# Patient Record
Sex: Male | Born: 1965 | Race: White | Hispanic: No | Marital: Married | Smoking: Never smoker
Health system: Southern US, Community
[De-identification: ages and names within clinical notes are randomized; demographics above are authoritative.]

## PROBLEM LIST (undated history)

## (undated) DIAGNOSIS — Z86718 Personal history of other venous thrombosis and embolism: Secondary | ICD-10-CM

## (undated) DIAGNOSIS — S14105A Unspecified injury at C5 level of cervical spinal cord, initial encounter: Secondary | ICD-10-CM

## (undated) DIAGNOSIS — R011 Cardiac murmur, unspecified: Secondary | ICD-10-CM

## (undated) DIAGNOSIS — M2550 Pain in unspecified joint: Secondary | ICD-10-CM

## (undated) DIAGNOSIS — M549 Dorsalgia, unspecified: Secondary | ICD-10-CM

## (undated) DIAGNOSIS — I639 Cerebral infarction, unspecified: Secondary | ICD-10-CM

## (undated) DIAGNOSIS — Z860100 Personal history of colon polyps, unspecified: Secondary | ICD-10-CM

## (undated) DIAGNOSIS — M5410 Radiculopathy, site unspecified: Secondary | ICD-10-CM

## (undated) DIAGNOSIS — Z9889 Other specified postprocedural states: Secondary | ICD-10-CM

## (undated) DIAGNOSIS — F32A Depression, unspecified: Secondary | ICD-10-CM

## (undated) DIAGNOSIS — N3289 Other specified disorders of bladder: Secondary | ICD-10-CM

## (undated) DIAGNOSIS — N529 Male erectile dysfunction, unspecified: Secondary | ICD-10-CM

## (undated) DIAGNOSIS — R269 Unspecified abnormalities of gait and mobility: Secondary | ICD-10-CM

## (undated) DIAGNOSIS — F329 Major depressive disorder, single episode, unspecified: Secondary | ICD-10-CM

## (undated) DIAGNOSIS — Q211 Atrial septal defect: Secondary | ICD-10-CM

## (undated) DIAGNOSIS — K219 Gastro-esophageal reflux disease without esophagitis: Secondary | ICD-10-CM

## (undated) DIAGNOSIS — M109 Gout, unspecified: Secondary | ICD-10-CM

## (undated) DIAGNOSIS — G2581 Restless legs syndrome: Secondary | ICD-10-CM

## (undated) DIAGNOSIS — R945 Abnormal results of liver function studies: Secondary | ICD-10-CM

## (undated) DIAGNOSIS — G959 Disease of spinal cord, unspecified: Secondary | ICD-10-CM

## (undated) DIAGNOSIS — M1990 Unspecified osteoarthritis, unspecified site: Secondary | ICD-10-CM

## (undated) DIAGNOSIS — R112 Nausea with vomiting, unspecified: Secondary | ICD-10-CM

## (undated) DIAGNOSIS — G4733 Obstructive sleep apnea (adult) (pediatric): Secondary | ICD-10-CM

## (undated) DIAGNOSIS — R0981 Nasal congestion: Secondary | ICD-10-CM

## (undated) DIAGNOSIS — I10 Essential (primary) hypertension: Secondary | ICD-10-CM

## (undated) DIAGNOSIS — E162 Hypoglycemia, unspecified: Secondary | ICD-10-CM

## (undated) DIAGNOSIS — G8929 Other chronic pain: Secondary | ICD-10-CM

## (undated) DIAGNOSIS — Z9989 Dependence on other enabling machines and devices: Principal | ICD-10-CM

## (undated) DIAGNOSIS — G4734 Idiopathic sleep related nonobstructive alveolar hypoventilation: Secondary | ICD-10-CM

## (undated) DIAGNOSIS — H5310 Unspecified subjective visual disturbances: Secondary | ICD-10-CM

## (undated) DIAGNOSIS — M2540 Effusion, unspecified joint: Secondary | ICD-10-CM

## (undated) DIAGNOSIS — M79606 Pain in leg, unspecified: Secondary | ICD-10-CM

## (undated) DIAGNOSIS — G8250 Quadriplegia, unspecified: Secondary | ICD-10-CM

## (undated) DIAGNOSIS — I2782 Chronic pulmonary embolism: Secondary | ICD-10-CM

## (undated) DIAGNOSIS — K810 Acute cholecystitis: Secondary | ICD-10-CM

## (undated) DIAGNOSIS — I739 Peripheral vascular disease, unspecified: Secondary | ICD-10-CM

## (undated) DIAGNOSIS — G4700 Insomnia, unspecified: Secondary | ICD-10-CM

## (undated) HISTORY — DX: Chronic pulmonary embolism: I27.82

## (undated) HISTORY — DX: Gout, unspecified: M10.9

## (undated) HISTORY — DX: Essential (primary) hypertension: I10

## (undated) HISTORY — PX: COLONOSCOPY: SHX174

## (undated) HISTORY — PX: TIBIA FRACTURE SURGERY: SHX806

## (undated) HISTORY — DX: Acute cholecystitis: K81.0

## (undated) HISTORY — DX: Nasal congestion: R09.81

## (undated) HISTORY — DX: Unspecified abnormalities of gait and mobility: R26.9

## (undated) HISTORY — DX: Radiculopathy, site unspecified: M54.10

## (undated) HISTORY — DX: Dependence on other enabling machines and devices: Z99.89

## (undated) HISTORY — DX: Abnormal results of liver function studies: R94.5

## (undated) HISTORY — PX: OTHER SURGICAL HISTORY: SHX169

## (undated) HISTORY — DX: Other specified postprocedural states: Z98.890

## (undated) HISTORY — DX: Idiopathic sleep related nonobstructive alveolar hypoventilation: G47.34

## (undated) HISTORY — PX: BACK SURGERY: SHX140

## (undated) HISTORY — DX: Atrial septal defect: Q21.1

## (undated) HISTORY — DX: Unspecified subjective visual disturbances: H53.10

## (undated) HISTORY — PX: FRACTURE SURGERY: SHX138

## (undated) HISTORY — DX: Dorsalgia, unspecified: M54.9

## (undated) HISTORY — DX: Disease of spinal cord, unspecified: G95.9

## (undated) HISTORY — DX: Gastro-esophageal reflux disease without esophagitis: K21.9

## (undated) HISTORY — DX: Obstructive sleep apnea (adult) (pediatric): G47.33

## (undated) HISTORY — DX: Personal history of other venous thrombosis and embolism: Z86.718

## (undated) HISTORY — DX: Pain in leg, unspecified: M79.606

## (undated) HISTORY — DX: Male erectile dysfunction, unspecified: N52.9

---

## 1997-10-23 ENCOUNTER — Ambulatory Visit (HOSPITAL_COMMUNITY): Admission: RE | Admit: 1997-10-23 | Discharge: 1997-10-23 | Payer: Self-pay | Admitting: Gynecology

## 2000-01-13 ENCOUNTER — Encounter: Payer: Self-pay | Admitting: Neurosurgery

## 2000-01-13 ENCOUNTER — Inpatient Hospital Stay (HOSPITAL_COMMUNITY): Admission: EM | Admit: 2000-01-13 | Discharge: 2000-01-17 | Payer: Self-pay

## 2000-01-13 ENCOUNTER — Encounter: Payer: Self-pay | Admitting: General Surgery

## 2000-01-15 ENCOUNTER — Encounter: Payer: Self-pay | Admitting: Neurosurgery

## 2000-01-17 ENCOUNTER — Inpatient Hospital Stay (HOSPITAL_COMMUNITY)
Admission: EM | Admit: 2000-01-17 | Discharge: 2000-02-05 | Payer: Self-pay | Admitting: Physical Medicine and Rehabilitation

## 2000-02-11 ENCOUNTER — Encounter
Admission: RE | Admit: 2000-02-11 | Discharge: 2000-05-11 | Payer: Self-pay | Admitting: Physical Medicine and Rehabilitation

## 2000-03-28 ENCOUNTER — Encounter: Payer: Self-pay | Admitting: Neurosurgery

## 2000-03-28 ENCOUNTER — Ambulatory Visit (HOSPITAL_COMMUNITY): Admission: RE | Admit: 2000-03-28 | Discharge: 2000-03-28 | Payer: Self-pay | Admitting: Neurosurgery

## 2000-04-01 ENCOUNTER — Ambulatory Visit (HOSPITAL_COMMUNITY): Admission: RE | Admit: 2000-04-01 | Discharge: 2000-04-01 | Payer: Self-pay | Admitting: Neurosurgery

## 2000-04-01 ENCOUNTER — Encounter: Payer: Self-pay | Admitting: Neurosurgery

## 2000-05-29 ENCOUNTER — Encounter
Admission: RE | Admit: 2000-05-29 | Discharge: 2000-06-18 | Payer: Self-pay | Admitting: Physical Medicine and Rehabilitation

## 2000-07-22 ENCOUNTER — Ambulatory Visit (HOSPITAL_COMMUNITY): Admission: RE | Admit: 2000-07-22 | Discharge: 2000-07-22 | Payer: Self-pay | Admitting: Orthopedic Surgery

## 2000-07-22 ENCOUNTER — Encounter: Payer: Self-pay | Admitting: Orthopedic Surgery

## 2000-08-04 HISTORY — PX: KNEE ARTHROSCOPY W/ ACL RECONSTRUCTION: SHX1858

## 2000-09-23 ENCOUNTER — Encounter
Admission: RE | Admit: 2000-09-23 | Discharge: 2000-10-09 | Payer: Self-pay | Admitting: Physical Medicine and Rehabilitation

## 2000-11-06 ENCOUNTER — Encounter: Admission: RE | Admit: 2000-11-06 | Discharge: 2001-02-04 | Payer: Self-pay | Admitting: Orthopedic Surgery

## 2001-02-08 ENCOUNTER — Encounter: Admission: RE | Admit: 2001-02-08 | Discharge: 2001-03-04 | Payer: Self-pay | Admitting: Orthopedic Surgery

## 2003-09-19 ENCOUNTER — Encounter
Admission: RE | Admit: 2003-09-19 | Discharge: 2003-12-18 | Payer: Self-pay | Admitting: Physical Medicine & Rehabilitation

## 2010-12-13 ENCOUNTER — Other Ambulatory Visit: Payer: Self-pay | Admitting: Neurology

## 2010-12-13 DIAGNOSIS — M4712 Other spondylosis with myelopathy, cervical region: Secondary | ICD-10-CM

## 2010-12-18 ENCOUNTER — Ambulatory Visit
Admission: RE | Admit: 2010-12-18 | Discharge: 2010-12-18 | Disposition: A | Discharge status: Home / Self Care | Payer: Medicare Other | Source: Ambulatory Visit | Attending: Neurology | Admitting: Neurology

## 2010-12-18 DIAGNOSIS — M4712 Other spondylosis with myelopathy, cervical region: Secondary | ICD-10-CM

## 2011-05-26 ENCOUNTER — Inpatient Hospital Stay (HOSPITAL_COMMUNITY)
Admission: RE | Admit: 2011-05-26 | Discharge: 2011-05-27 | DRG: 053 | Disposition: A | Discharge status: Home / Self Care | Payer: Medicare Other | Source: Ambulatory Visit | Attending: Neurology | Admitting: Neurology

## 2011-05-26 DIAGNOSIS — IMO0002 Reserved for concepts with insufficient information to code with codable children: Secondary | ICD-10-CM

## 2011-05-26 DIAGNOSIS — G4733 Obstructive sleep apnea (adult) (pediatric): Secondary | ICD-10-CM | POA: Diagnosis present

## 2011-05-26 DIAGNOSIS — K219 Gastro-esophageal reflux disease without esophagitis: Secondary | ICD-10-CM | POA: Diagnosis present

## 2011-05-26 DIAGNOSIS — Z79899 Other long term (current) drug therapy: Secondary | ICD-10-CM

## 2011-05-26 DIAGNOSIS — I10 Essential (primary) hypertension: Secondary | ICD-10-CM | POA: Diagnosis present

## 2011-05-26 DIAGNOSIS — F17200 Nicotine dependence, unspecified, uncomplicated: Secondary | ICD-10-CM | POA: Diagnosis present

## 2011-05-26 DIAGNOSIS — G8254 Quadriplegia, C5-C7 incomplete: Principal | ICD-10-CM | POA: Diagnosis present

## 2011-05-26 DIAGNOSIS — R269 Unspecified abnormalities of gait and mobility: Secondary | ICD-10-CM | POA: Diagnosis present

## 2011-05-26 LAB — COMPREHENSIVE METABOLIC PANEL
AST: 24 U/L (ref 0–37)
Alkaline Phosphatase: 72 U/L (ref 39–117)
BUN: 13 mg/dL (ref 6–23)
CO2: 31 mEq/L (ref 19–32)
Chloride: 104 mEq/L (ref 96–112)
Creatinine, Ser: 0.98 mg/dL (ref 0.50–1.35)
GFR calc non Af Amer: >90 mL/min (ref >90)
Potassium: 4.5 mEq/L (ref 3.5–5.1)
Total Bilirubin: 0.2 mg/dL — ABNORMAL LOW (ref 0.3–1.2)

## 2011-05-26 LAB — CBC
HCT: 42 % (ref 39.0–52.0)
Hemoglobin: 13.7 g/dL (ref 13.0–17.0)
MCH: 27.1 pg (ref 26.0–34.0)
MCHC: 32.6 g/dL (ref 30.0–36.0)
MCV: 83 fL (ref 78.0–100.0)
Platelets: 235 10*3/uL (ref 150–400)
RBC: 5.06 MIL/uL (ref 4.22–5.81)
RDW: 15.2 % (ref 11.5–15.5)
WBC: 5.9 10*3/uL (ref 4.0–10.5)

## 2011-05-27 LAB — CSF CELL COUNT WITH DIFFERENTIAL
Tube #: 1
WBC, CSF: 2 /mm3 (ref 0–5)

## 2011-05-27 LAB — PROTEIN AND GLUCOSE, CSF: Glucose, CSF: 63 mg/dL (ref 43–76)

## 2011-06-05 NOTE — Op Note (Signed)
  [name], [name] NO.:  [id number]  MEDICAL RECORD NO.:  [id number]  LOCATION:  3036                         FACILITY:  [facility]  PHYSICIAN:  Marlan Palau, M.D.  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  The patient is being evaluated for possible baclofen pump.  The patient comes in for a baclofen challenge.  Lumbar puncture was performed with the patient in a fetal position on the right side.  Lower back was cleaned with Betadine solution, approximately 2 mL of 1% Xylocaine was used a local anesthetic.  A 20- gauge spinal needle was inserted in the L3-4 interspace and approximately 7 mL of clear colorless spinal fluid removed for testing. Opening pressure was 270 mm of water.  3 mL of baclofen containing 50 mcg of baclofen was inserted into the low back and flushed with 3 mL of sterile preservative-free normal saline.  The patient tolerated the procedure well.  No complications to the above procedure were noted. Spinal fluid was sent for cells, differential, glucose, and protein.  The patient will be seen and evaluated by Physical Therapy per protocol with Ashworth scale.     Marlan Palau, M.D.     CKW/MEDQ  D:  05/27/2011  T:  05/27/2011  Job:  [id number]  cc:   [facility]  Electronically Signed by Thana Farr M.D. on 06/05/2011 09:29:44 AM

## 2011-06-05 NOTE — Discharge Summary (Signed)
NAMEMARLIN, [name] NO.:  [id number]  MEDICAL RECORD NO.:  [id number]  LOCATION:  3036                         FACILITY:  [facility]  PHYSICIAN:  Marlan Palau, M.D.  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  The patient has had significant spasticity involving the lower extremities and the left arm.  The patient has been on oral baclofen and diazepam with ongoing difficulty.  The patient was brought in for a baclofen challenge intrathecally in preparation for possible baclofen pump.  ADMISSION DIAGNOSES: 1. Spinal cord injury with spastic quadriparesis. 2. Gait disorder. 3. Hypertension.  DISCHARGE DIAGNOSES: 1. Spinal cord injury with spastic quadriparesis. 2. Gait disorder. 3. Hypertension.  PROCEDURES DURING THIS ADMISSION:  Lumbar puncture with intrathecal baclofen administration.  Complications of above procedures were none.  HISTORY OF PRESENT ILLNESS:  As above.  PAST MEDICAL HISTORY:  Significant for: 1. Spinal cord injury with spastic quadriparesis. 2. Gait disorder. 3. Obstructive sleep apnea, on CPAP. 4. Right knee surgery. 5. Gastroesophageal reflux disease. 6. Hypertension.  MEDICATIONS PRIOR TO ADMISSION: 1. Diclofenac 75 mg twice daily. 2. Omeprazole 40 mg daily. 3. Gabapentin 800 mg 3 times daily. 4. Baclofen 20 mg 3 times daily. 5. Amitriptyline 150 mg at night. 6. Benazepril/hydrochlorothiazide 20/25 mg tablet 1 tablet daily. 7. Diazepam 5 mg 3 times daily.  The patient chews tobacco, has never smoked.  The patient does not drink alcohol and has no known allergies.  Please refer to history and physical  dictation summary for social history, family history, review of systems, and physical examination.  LABORATORY VALUES:  Notable for white count of 5.9, hemoglobin 13.7, hematocrit 42.0, MCV of 83.0, and platelets of 235.  INR is 1.04. Sodium 143, potassium 4.5, chloride 104, CO2 of 31, glucose of 105, BUN of 13, and creatinine 0.98.  Total bili is 0.2, alk phosphatase 72, SGOT of 24, SGPT of 30, total protein 6.6, albumin 3.6, and calcium 9.2.  Spinal fluid analysis revealed 2 white cells, 0 red cells, glucose 63, total protein 64.  HOSPITAL COURSE:  The patient was admitted for an evaluation.  Physical therapy consult was obtained and the patient had an evaluation with the intrathecal baclofen protocol with an Ashworth scale the evening prior to the spinal tap.  The patient underwent lumbar puncture the morning of May 27, 2011 and 50 mcg of intrathecal baclofen was administered. The patient had physical therapy evaluations 1, 2, and 4 hours after the intrathecal baclofen.  The patient did respond to the intrathecal baclofen.  The patient had been tapered off oral Bactrim prior to the trial.  The patient was discharged to home on his home medications.  The patient will be referred to Neurosurgery for possible baclofen pump placement.  The patient did well following the spinal tap.     Marlan Palau, M.D.  CKW/MEDQ  D:  05/27/2011  T:  05/28/2011  Job:  [id number]  cc:   [facility]  Electronically Signed by Thana Farr M.D. on 06/05/2011 04:54:09 AM

## 2011-06-05 NOTE — H&P (Signed)
NAMEXAVI, TOMASIK NO.:  [id number]  MEDICAL RECORD NO.:  [id number]  LOCATION:  3036                         FACILITY:  [facility]  PHYSICIAN:  Marlan Palau, M.D.  DATE OF BIRTH:  [birth year]  DATE OF ADMISSION:  05/26/2011 DATE OF DISCHARGE:                             HISTORY & PHYSICAL   HISTORY PRESENT ILLNESS:  Ralph [name] is a 45 year old right- handed white male, born on 03/20/1966, with a history of a spinal cord injury at the C5-6 level with a spastic quadriparesis.  The patient generally ambulates with a cane and does fall on occasion.  The patient has been on oral baclofen and diazepam with some trouble with drowsiness and cognitive issues.  The patient does report chronic back pain and pain into the hips.  The patient has some increasing problems with walking, proximal weakness of the right leg, and spasticity in the legs. The patient desires to have a baclofen pump insertion to help this spasticity.  The patient came to the hospital for a baclofen intrathecal challenge.  PAST MEDICAL HISTORY:  Significant for: 1. Spinal cord injury with spastic quadriparesis. 2. Gait disorder. 3. Obstructive sleep apnea, on CPAP. 4. Right knee surgery for ACL repair. 5. Gastroesophageal reflux disease. 6. Hypertension.  MEDICATIONS PRIOR TO ADMISSION: 1. Diclofenac 75 mg twice daily. 2. Omeprazole 40 mg daily. 3. Neurontin 800 mg 3 times daily. 4. Baclofen 20 mg 3 times daily. 5. Amitriptyline 150 mg daily. 6. Benazepril/hydrochlorothiazide 20/25 mg 1 tablet daily. 7. Diazepam 1 tablet 3 times daily. The patient does chew tobacco.  He has never smoked.  Does not drink alcohol.  He has no known drug allergies.  SOCIAL HISTORY:  The patient is married, lives with his wife that is on disability.  Has a high school education.  Has 2 children who are alive and well.  FAMILY MEDICAL HISTORY:  Notable for parents have passed away.  Etiology of  death is unknown.  The patient has 1 brother, 1 sister with a history of mental retardation.  The patient does not know much about his family medical history otherwise.  The patient grew up in an orphanage.  REVIEW OF SYSTEMS:  Notable for some problems with weight gain, swelling in the legs, increased thirst, flushing problems, memory problems, numbness and weakness in the extremities, dizziness, sleepiness on the medication, some joint pains and swelling, achy muscles, decreased energy.  PHYSICAL EXAMINATION:  VITAL SIGNS:  Blood pressure currently is 119/77, heart rate 77, respiratory rate 18, temperature afebrile. GENERAL:  This patient is a moderately to markedly obese white male who is alert and cooperative at the time of examination. HEENT:  Head is atraumatic.  Eyes, pupils are round and reactive to light. NECK:  Supple.  No carotid bruits noted. RESPIRATORY:  Clear. CARDIOVASCULAR:  Regular rate and rhythm.  No obvious murmurs or rubs noted. ABDOMEN:  Positive bowel sounds.  No organomegaly or tenderness noted. Abdomen is quite obese. EXTREMITIES:  Without significant edema. NEUROLOGIC:  Cranial nerves as above.  Facial symmetry is present.  The patient again has full extraocular movements.  Visual fields are full. Speech is normal.  No aphasia  or dysarthria noted.  Motor testing reveals that the fingers of the left hand  were in flexion.  The patient has some use of the right hand with fair grip.  Proximal strength in arms is fairly good.  The patient has mild bilateral proximal weakness in the lower extremities.  Distal strength in the legs is fairly good. Slight increase in motor tone is noted.  The patient has fair finger- nose-finger and can perform toe-to-finger.  The patient was not ambulated.  Deep tendon reflexes reveal brisk lower extremity reflexes. Toes are neutral bilaterally.  LABORATORY VALUES:  Notable for white count of 5.9, hemoglobin of 13.7, hematocrit  of 42.0, MCV of 83.0, platelets of 235.  Sodium 143, potassium 4.,5 chloride 104, CO2 of 31, glucose of 105, BUN of 13, creatinine 0.98.  Alk phos is 72, SGOT 24, SGPT of 30, total protein 6.6, albumin 3.6, calcium 9.2.  IMPRESSION:  History of cervical myelopathy with gait disorder.  The patient will be coming in for lumbar puncture with intrathecal baclofen administration as part of a baclofen challenge.  This patient will be seen by physical therapy before and after the spinal tap. Depending upon the results of the evaluation, the patient may be considered for baclofen pump.     Marlan Palau, M.D.     CKW/MEDQ  D:  05/26/2011  T:  05/27/2011  Job:  [id number]  cc:   [facility]  Electronically Signed by Thana Farr M.D. on 06/05/2011 09:29:38 AM

## 2011-07-16 DIAGNOSIS — I10 Essential (primary) hypertension: Secondary | ICD-10-CM | POA: Diagnosis present

## 2011-08-05 HISTORY — PX: OTHER SURGICAL HISTORY: SHX169

## 2011-09-17 ENCOUNTER — Other Ambulatory Visit: Payer: Self-pay | Admitting: Neurology

## 2011-09-17 DIAGNOSIS — M4712 Other spondylosis with myelopathy, cervical region: Secondary | ICD-10-CM

## 2011-09-17 DIAGNOSIS — R269 Unspecified abnormalities of gait and mobility: Secondary | ICD-10-CM

## 2011-09-17 DIAGNOSIS — M545 Low back pain: Secondary | ICD-10-CM

## 2011-09-22 ENCOUNTER — Ambulatory Visit
Admission: RE | Admit: 2011-09-22 | Discharge: 2011-09-22 | Disposition: A | Discharge status: Home / Self Care | Payer: Medicare Other | Source: Ambulatory Visit | Attending: Neurology | Admitting: Neurology

## 2011-09-22 DIAGNOSIS — M545 Low back pain: Secondary | ICD-10-CM

## 2011-09-22 DIAGNOSIS — M4712 Other spondylosis with myelopathy, cervical region: Secondary | ICD-10-CM

## 2011-09-22 DIAGNOSIS — R269 Unspecified abnormalities of gait and mobility: Secondary | ICD-10-CM

## 2011-11-28 ENCOUNTER — Other Ambulatory Visit: Payer: Self-pay | Admitting: Sports Medicine

## 2011-11-28 DIAGNOSIS — M25561 Pain in right knee: Secondary | ICD-10-CM

## 2011-12-04 ENCOUNTER — Ambulatory Visit
Admission: RE | Admit: 2011-12-04 | Discharge: 2011-12-04 | Disposition: A | Discharge status: Home / Self Care | Payer: Medicare Other | Source: Ambulatory Visit | Attending: Sports Medicine | Admitting: Sports Medicine

## 2011-12-04 DIAGNOSIS — M25561 Pain in right knee: Secondary | ICD-10-CM

## 2011-12-19 ENCOUNTER — Ambulatory Visit (HOSPITAL_COMMUNITY)
Admission: RE | Admit: 2011-12-19 | Discharge: 2011-12-19 | Disposition: A | Discharge status: Home / Self Care | Payer: Medicare Other | Source: Ambulatory Visit | Attending: Sports Medicine | Admitting: Sports Medicine

## 2011-12-19 DIAGNOSIS — M79606 Pain in leg, unspecified: Secondary | ICD-10-CM

## 2011-12-19 DIAGNOSIS — M79609 Pain in unspecified limb: Secondary | ICD-10-CM

## 2011-12-19 DIAGNOSIS — M7989 Other specified soft tissue disorders: Secondary | ICD-10-CM

## 2011-12-19 NOTE — Progress Notes (Signed)
*  Preliminary Results* Right lower extremity venous duplex completed. Right lower extremity is negative for deep vein thrombosis.  12/19/2011 5:29 PM  Elpidio Galea, RDMS,RDCS

## 2012-08-04 DIAGNOSIS — I639 Cerebral infarction, unspecified: Secondary | ICD-10-CM

## 2012-08-04 HISTORY — DX: Cerebral infarction, unspecified: I63.9

## 2012-11-03 ENCOUNTER — Other Ambulatory Visit: Payer: Self-pay | Admitting: Orthopedic Surgery

## 2012-11-03 DIAGNOSIS — M545 Low back pain: Secondary | ICD-10-CM

## 2012-11-06 ENCOUNTER — Other Ambulatory Visit: Payer: Medicare Other

## 2012-11-08 ENCOUNTER — Ambulatory Visit
Admission: RE | Admit: 2012-11-08 | Discharge: 2012-11-08 | Disposition: A | Discharge status: Home / Self Care | Payer: Medicare Other | Source: Ambulatory Visit | Attending: Orthopedic Surgery | Admitting: Orthopedic Surgery

## 2012-11-08 DIAGNOSIS — M545 Low back pain: Secondary | ICD-10-CM

## 2013-01-06 ENCOUNTER — Ambulatory Visit (INDEPENDENT_AMBULATORY_CARE_PROVIDER_SITE_OTHER): Payer: Medicare Other | Admitting: Neurology

## 2013-01-06 ENCOUNTER — Encounter: Payer: Self-pay | Admitting: Neurology

## 2013-01-06 DIAGNOSIS — R269 Unspecified abnormalities of gait and mobility: Secondary | ICD-10-CM

## 2013-01-06 DIAGNOSIS — G8250 Quadriplegia, unspecified: Secondary | ICD-10-CM | POA: Insufficient documentation

## 2013-01-06 HISTORY — DX: Unspecified abnormalities of gait and mobility: R26.9

## 2013-01-06 NOTE — Progress Notes (Signed)
[name] is a 47 year old right-handed white male with a history of a spastic quadriparesis. The patient comes in today for a baclofen pump refill. Please refer to this procedure note.

## 2013-01-06 NOTE — Procedures (Signed)
  History:  Webster Patrone is a 47 year old right-handed gentleman with a history of a spastic quadriparesis. The patient has a baclofen pump in place, and this has helped his spasticity. More recently, the patient has developed low back pain, currently followed by Dr. Alveda Reasons, with epidural steroid injections without benefit. The patient comes in for a baclofen pump refill. The patient has been doing relatively well with his spasticity. The patient has fallen on occasion.  Baclofen pump refill note  The baclofen pump site was cleaned with Betadine solution. A 21-gauge needle was inserted into the pump port site. Approximately 5 cc of residual baclofen was removed. 200 cc of replacement baclofen was placed into the pump at 2000 mcg/cc concentration.  The pump was reprogrammed for the following settings: 215.1 mcg/day rate with a simple continuous setting.  The alarm volume is set at 2.0 cc. The next alarm date is 06/22/13.  The patient tolerated the procedure well. There were no complications of the above procedure.

## 2013-02-02 ENCOUNTER — Other Ambulatory Visit: Payer: Self-pay | Admitting: Neurology

## 2013-02-12 ENCOUNTER — Other Ambulatory Visit: Payer: Self-pay | Admitting: Neurology

## 2013-04-28 ENCOUNTER — Telehealth: Payer: Self-pay | Admitting: *Deleted

## 2013-04-28 NOTE — Telephone Encounter (Signed)
I need to find out when my Baclofen pump refill will be, because I need to schedule surgery. I want to have it done before surgery. Please call. I think it should be every six months.

## 2013-04-28 NOTE — Telephone Encounter (Signed)
RETURNED PT CALL NEXT ALARM DATE IS 06/22/13. LEFT MSG FOR PATIENT TO CALL BACK TO SCHEDULE APPT.

## 2013-05-17 ENCOUNTER — Other Ambulatory Visit: Payer: Self-pay | Admitting: Orthopedic Surgery

## 2013-05-23 ENCOUNTER — Telehealth: Payer: Self-pay | Admitting: Neurology

## 2013-05-23 MED ORDER — AMITRIPTYLINE HCL 75 MG PO TABS: 75.0000 mg | ORAL_TABLET | Freq: Two times a day (BID) | ORAL | Status: DC

## 2013-05-23 NOTE — Telephone Encounter (Signed)
75mg  BID is the same total dose as 150mg  once daily.  Rx has been updated and sent.

## 2013-05-25 ENCOUNTER — Encounter (HOSPITAL_COMMUNITY): Payer: Self-pay | Admitting: Pharmacy Technician

## 2013-05-27 ENCOUNTER — Ambulatory Visit (HOSPITAL_COMMUNITY)
Admission: RE | Admit: 2013-05-27 | Discharge: 2013-05-27 | Disposition: A | Discharge status: Home / Self Care | Payer: Medicare Other | Source: Ambulatory Visit | Attending: Orthopedic Surgery | Admitting: Orthopedic Surgery

## 2013-05-27 ENCOUNTER — Encounter (HOSPITAL_COMMUNITY): Payer: Self-pay

## 2013-05-27 ENCOUNTER — Encounter (HOSPITAL_COMMUNITY)
Admission: RE | Admit: 2013-05-27 | Discharge: 2013-05-27 | Disposition: A | Discharge status: Home / Self Care | Payer: Medicare Other | Source: Ambulatory Visit | Attending: Orthopedic Surgery | Admitting: Orthopedic Surgery

## 2013-05-27 DIAGNOSIS — Z01812 Encounter for preprocedural laboratory examination: Secondary | ICD-10-CM | POA: Insufficient documentation

## 2013-05-27 DIAGNOSIS — Z01818 Encounter for other preprocedural examination: Secondary | ICD-10-CM | POA: Insufficient documentation

## 2013-05-27 DIAGNOSIS — Z01810 Encounter for preprocedural cardiovascular examination: Secondary | ICD-10-CM | POA: Insufficient documentation

## 2013-05-27 HISTORY — DX: Unspecified injury at C5 level of cervical spinal cord, initial encounter: S14.105A

## 2013-05-27 LAB — ABO/RH: ABO/RH(D): O POS

## 2013-05-27 LAB — CBC WITH DIFFERENTIAL/PLATELET
Basophils Absolute: 0 10*3/uL (ref 0.0–0.1)
Basophils Relative: 0 % (ref 0–1)
Eosinophils Absolute: 0.1 10*3/uL (ref 0.0–0.7)
Eosinophils Relative: 2 % (ref 0–5)
Hemoglobin: 14.5 g/dL (ref 13.0–17.0)
Lymphs Abs: 1.5 10*3/uL (ref 0.7–4.0)
MCH: 29 pg (ref 26.0–34.0)
MCHC: 33.2 g/dL (ref 30.0–36.0)
MCV: 87.4 fL (ref 78.0–100.0)
Monocytes Absolute: 0.5 10*3/uL (ref 0.1–1.0)
Monocytes Relative: 9 % (ref 3–12)
Neutro Abs: 3.6 10*3/uL (ref 1.7–7.7)
Platelets: 222 10*3/uL (ref 150–400)
RBC: 5 MIL/uL (ref 4.22–5.81)
RDW: 13.5 % (ref 11.5–15.5)

## 2013-05-27 LAB — COMPREHENSIVE METABOLIC PANEL
AST: 23 U/L (ref 0–37)
Alkaline Phosphatase: 64 U/L (ref 39–117)
BUN: 12 mg/dL (ref 6–23)
Calcium: 9.2 mg/dL (ref 8.4–10.5)
Chloride: 102 mEq/L (ref 96–112)
Creatinine, Ser: 0.8 mg/dL (ref 0.50–1.35)
GFR calc Af Amer: >90 mL/min (ref >90)
Glucose, Bld: 94 mg/dL (ref 70–99)
Potassium: 4.2 mEq/L (ref 3.5–5.1)
Total Protein: 7 g/dL (ref 6.0–8.3)

## 2013-05-27 LAB — URINALYSIS, ROUTINE W REFLEX MICROSCOPIC
Bilirubin Urine: NEGATIVE
Glucose, UA: NEGATIVE mg/dL
Hgb urine dipstick: NEGATIVE
Ketones, ur: NEGATIVE mg/dL
Nitrite: NEGATIVE
Specific Gravity, Urine: 1.015 (ref 1.005–1.030)
Urobilinogen, UA: 1 mg/dL (ref 0.0–1.0)

## 2013-05-27 LAB — SURGICAL PCR SCREEN: MRSA, PCR: POSITIVE — AB

## 2013-05-27 LAB — PROTIME-INR
INR: 1.03 (ref 0.00–1.49)
Prothrombin Time: 13.3 seconds (ref 11.6–15.2)

## 2013-05-27 LAB — TYPE AND SCREEN: Antibody Screen: NEGATIVE

## 2013-05-27 NOTE — Pre-Procedure Instructions (Signed)
Ralph [name]  05/27/2013   Your procedure is scheduled on:  October 30  Report to [facility] Entrance "A" [address] at [employer] AM.  Call this number if you have problems the morning of surgery: [phone]   Remember:   Do not eat food or drink liquids after midnight.   Take these medicines the morning of surgery with A SIP OF WATER: Gabapentin, Metoprolol, Omeprazole   Do not take Aspirin, Aleve, Naproxen, Advil, Ibuprofen, Vitamin, Herbs, or Supplements starting today   Do not wear jewelry, make-up or nail polish.  Do not wear lotions, powders, or perfumes. You may wear deodorant.  Do not shave 48 hours prior to surgery. Men may shave face and neck.  Do not bring valuables to the hospital.  [facility] is not responsible                  for any belongings or valuables.               Contacts, dentures or bridgework may not be worn into surgery.  Leave suitcase in the car. After surgery it may be brought to your room.  For patients admitted to the hospital, discharge time is determined by your                treatment team.               Special Instructions: Shower using CHG 2 nights before surgery and the night before surgery.  If you shower the day of surgery use CHG.  Use special wash - you have one bottle of CHG for all showers.  You should use approximately 1/3 of the bottle for each shower.   Please read over the following fact sheets that you were given: Pain Booklet, Coughing and Deep Breathing, Blood Transfusion Information and MRSA Information

## 2013-05-30 ENCOUNTER — Encounter (HOSPITAL_COMMUNITY): Payer: Self-pay | Admitting: *Deleted

## 2013-05-30 NOTE — Progress Notes (Signed)
Dr. Marshell Levan office called and advised patient had a history of hypoglycemia but did not like disclosing this to healthcare providers. Same added to history

## 2013-06-01 MED ORDER — CEFAZOLIN SODIUM-DEXTROSE 2-3 GM-% IV SOLR
2.0000 g | INTRAVENOUS | Status: AC
  Administered 2013-06-02: 2 g via INTRAVENOUS
  Filled 2013-06-01: qty 50

## 2013-06-01 NOTE — H&P (Signed)
PREOPERATIVE H&P  Chief Complaint: L > R leg pain  HPI: Ralph [name] is a 47 y.o. male who presents with ongoing bilateral leg pain for years. Has failed multiple forms of conservative care including ESI and PT. MRI = stenosis at L4/5. Xrays = slippage at 4/5.   Past Medical History  Diagnosis Date  . Other quadriplegia and quadriparesis   . Gait disorder   . Gastroesophageal reflux disease   . Obesity   . Lumbago   . Leg fracture, right   . Obstructive sleep apnea on CPAP   . Spinal cord injury, C5-C7   . Hypertension     does not see a cardiologist  . History of MRSA infection     wounds in Fingers and then on leg  . Hypoglycemia    Past Surgical History  Procedure Laterality Date  . Right anterior cruciate ligament repair    . Baclofen pump placement     History   Social History  . Marital Status: Married    Spouse Name: N/A    Number of Children: N/A  . Years of Education: N/A   Social History Main Topics  . Smoking status: Never Smoker   . Smokeless tobacco: Current User    Types: Chew  . Alcohol Use: No  . Drug Use: No  . Sexual Activity: Not on file   Other Topics Concern  . Not on file   Social History Narrative  . No narrative on file   No family history on file. No Known Allergies Prior to Admission medications   Medication Sig Start Date End Date Taking? Authorizing Provider  amitriptyline (ELAVIL) 75 MG tablet Take 150 mg by mouth at bedtime.   Yes Historical Provider, MD  benazepril (LOTENSIN) 20 MG tablet Take 20 mg by mouth daily.   Yes Historical Provider, MD  diazepam (VALIUM) 5 MG tablet Take 5 mg by mouth at bedtime as needed for sleep.   Yes Historical Provider, MD  gabapentin (NEURONTIN) 800 MG tablet Take 800 mg by mouth 3 (three) times daily.   Yes Historical Provider, MD  omeprazole (PRILOSEC) 20 MG capsule Take 40 mg by mouth daily.   Yes Historical Provider, MD  BACLOFEN IT by Intrathecal route.    Historical Provider, MD   L-ARGININE PO Take by mouth.    Historical Provider, MD  metoprolol succinate (TOPROL-XL) 50 MG 24 hr tablet Take 50 mg by mouth daily. Take with or immediately following a meal.    Historical Provider, MD  naproxen (NAPROSYN) 500 MG tablet Take 500 mg by mouth as needed.    Historical Provider, MD     All other systems have been reviewed and were otherwise negative with the exception of those mentioned in the HPI and as above.  Physical Exam: There were no vitals filed for this visit.  General: Alert, no acute distress Cardiovascular: No pedal edema Respiratory: No cyanosis, no use of accessory musculature Skin: No lesions in the area of chief complaint Neurologic: Sensation intact distally Psychiatric: Patient is competent for consent with normal mood and affect Lymphatic: No axillary or cervical lymphadenopathy   Assessment/Plan: Bilateral leg pain R>L, spinal stenosis Plan for Procedure(s): TRANSFORAMINAL LUMBAR INTERBODY FUSION (TLIF) WITH PEDICLE SCREW FIXATION L4/5   Emilee Hero, MD 06/01/2013 11:05 AM

## 2013-06-02 ENCOUNTER — Inpatient Hospital Stay (HOSPITAL_COMMUNITY): Payer: Medicare Other | Admitting: Certified Registered Nurse Anesthetist

## 2013-06-02 ENCOUNTER — Encounter (HOSPITAL_COMMUNITY)
Admission: RE | Disposition: A | Discharge status: Home / Self Care | Payer: Medicare Other | Source: Ambulatory Visit | Attending: Orthopedic Surgery

## 2013-06-02 ENCOUNTER — Encounter (HOSPITAL_COMMUNITY): Payer: Medicare Other | Admitting: Certified Registered Nurse Anesthetist

## 2013-06-02 ENCOUNTER — Encounter (HOSPITAL_COMMUNITY): Payer: Self-pay | Admitting: Certified Registered Nurse Anesthetist

## 2013-06-02 ENCOUNTER — Inpatient Hospital Stay (HOSPITAL_COMMUNITY): Payer: Medicare Other

## 2013-06-02 ENCOUNTER — Inpatient Hospital Stay (HOSPITAL_COMMUNITY)
Admission: RE | Admit: 2013-06-02 | Discharge: 2013-06-05 | DRG: 459 | Disposition: A | Discharge status: Home / Self Care | Payer: Medicare Other | Source: Ambulatory Visit | Attending: Orthopedic Surgery | Admitting: Orthopedic Surgery

## 2013-06-02 DIAGNOSIS — G8250 Quadriplegia, unspecified: Secondary | ICD-10-CM | POA: Diagnosis present

## 2013-06-02 DIAGNOSIS — Z8614 Personal history of Methicillin resistant Staphylococcus aureus infection: Secondary | ICD-10-CM

## 2013-06-02 DIAGNOSIS — I10 Essential (primary) hypertension: Secondary | ICD-10-CM | POA: Diagnosis present

## 2013-06-02 DIAGNOSIS — M48061 Spinal stenosis, lumbar region without neurogenic claudication: Principal | ICD-10-CM | POA: Diagnosis present

## 2013-06-02 DIAGNOSIS — G4733 Obstructive sleep apnea (adult) (pediatric): Secondary | ICD-10-CM | POA: Diagnosis present

## 2013-06-02 DIAGNOSIS — E669 Obesity, unspecified: Secondary | ICD-10-CM | POA: Diagnosis present

## 2013-06-02 DIAGNOSIS — R269 Unspecified abnormalities of gait and mobility: Secondary | ICD-10-CM | POA: Diagnosis present

## 2013-06-02 DIAGNOSIS — K219 Gastro-esophageal reflux disease without esophagitis: Secondary | ICD-10-CM | POA: Diagnosis present

## 2013-06-02 DIAGNOSIS — Z981 Arthrodesis status: Secondary | ICD-10-CM

## 2013-06-02 HISTORY — PX: TRANSFORAMINAL LUMBAR INTERBODY FUSION (TLIF) WITH PEDICLE SCREW FIXATION 1 LEVEL: SHX6141

## 2013-06-02 HISTORY — DX: Hypoglycemia, unspecified: E16.2

## 2013-06-02 HISTORY — DX: Nausea with vomiting, unspecified: R11.2

## 2013-06-02 HISTORY — DX: Other specified postprocedural states: Z98.890

## 2013-06-02 SURGERY — TRANSFORAMINAL LUMBAR INTERBODY FUSION (TLIF) WITH PEDICLE SCREW FIXATION 1 LEVEL
Anesthesia: General | Site: Spine Lumbar | Laterality: Left | Wound class: Clean

## 2013-06-02 MED ORDER — GABAPENTIN 800 MG PO TABS
800.0000 mg | ORAL_TABLET | Freq: Three times a day (TID) | ORAL | Status: DC
  Filled 2013-06-02 (×2): qty 1

## 2013-06-02 MED ORDER — SODIUM CHLORIDE 0.9 % IJ SOLN: 3.0000 mL | INTRAMUSCULAR | Status: DC | PRN

## 2013-06-02 MED ORDER — DIPHENHYDRAMINE HCL 50 MG/ML IJ SOLN: 12.5000 mg | Freq: Four times a day (QID) | INTRAMUSCULAR | Status: DC | PRN

## 2013-06-02 MED ORDER — SODIUM CHLORIDE 0.9 % IV SOLN: 250.0000 mL | INTRAVENOUS | Status: DC

## 2013-06-02 MED ORDER — CEFAZOLIN SODIUM 1-5 GM-% IV SOLN
1.0000 g | Freq: Three times a day (TID) | INTRAVENOUS | Status: AC
  Administered 2013-06-02 – 2013-06-03 (×2): 1 g via INTRAVENOUS
  Filled 2013-06-02 (×2): qty 50

## 2013-06-02 MED ORDER — PROPOFOL INFUSION 10 MG/ML OPTIME
INTRAVENOUS | Status: DC | PRN
  Administered 2013-06-02: 75 ug/kg/min via INTRAVENOUS

## 2013-06-02 MED ORDER — PROMETHAZINE HCL 25 MG/ML IJ SOLN: 6.2500 mg | INTRAMUSCULAR | Status: DC | PRN

## 2013-06-02 MED ORDER — ALUM & MAG HYDROXIDE-SIMETH 200-200-20 MG/5ML PO SUSP: 30.0000 mL | Freq: Four times a day (QID) | ORAL | Status: DC | PRN

## 2013-06-02 MED ORDER — BISACODYL 5 MG PO TBEC
5.0000 mg | DELAYED_RELEASE_TABLET | Freq: Every day | ORAL | Status: DC | PRN
  Administered 2013-06-04: 5 mg via ORAL
  Filled 2013-06-02: qty 1

## 2013-06-02 MED ORDER — ARTIFICIAL TEARS OP OINT
TOPICAL_OINTMENT | OPHTHALMIC | Status: DC | PRN
  Administered 2013-06-02: 1 via OPHTHALMIC

## 2013-06-02 MED ORDER — THROMBIN 20000 UNITS EX SOLR
CUTANEOUS | Status: AC
  Filled 2013-06-02: qty 20000

## 2013-06-02 MED ORDER — DOCUSATE SODIUM 100 MG PO CAPS
100.0000 mg | ORAL_CAPSULE | Freq: Two times a day (BID) | ORAL | Status: DC
Start: 2013-06-02 — End: 2013-06-05
  Administered 2013-06-02 – 2013-06-05 (×5): 100 mg via ORAL
  Filled 2013-06-02 (×7): qty 1

## 2013-06-02 MED ORDER — FENTANYL CITRATE 0.05 MG/ML IJ SOLN: 50.0000 ug | Freq: Once | INTRAMUSCULAR | Status: DC

## 2013-06-02 MED ORDER — SODIUM CHLORIDE 0.9 % IJ SOLN: 9.0000 mL | INTRAMUSCULAR | Status: DC | PRN

## 2013-06-02 MED ORDER — FENTANYL CITRATE 0.05 MG/ML IJ SOLN
INTRAMUSCULAR | Status: DC | PRN
  Administered 2013-06-02 (×3): 100 ug via INTRAVENOUS
  Administered 2013-06-02: 50 ug via INTRAVENOUS

## 2013-06-02 MED ORDER — PROPOFOL 10 MG/ML IV BOLUS
INTRAVENOUS | Status: DC | PRN
  Administered 2013-06-02: 200 mg via INTRAVENOUS

## 2013-06-02 MED ORDER — ZOLPIDEM TARTRATE 5 MG PO TABS: 5.0000 mg | ORAL_TABLET | Freq: Every evening | ORAL | Status: DC | PRN

## 2013-06-02 MED ORDER — ONDANSETRON HCL 4 MG/2ML IJ SOLN: 4.0000 mg | Freq: Four times a day (QID) | INTRAMUSCULAR | Status: DC | PRN

## 2013-06-02 MED ORDER — ONDANSETRON HCL 4 MG/2ML IJ SOLN
4.0000 mg | INTRAMUSCULAR | Status: DC | PRN
  Administered 2013-06-02 – 2013-06-05 (×7): 4 mg via INTRAVENOUS
  Filled 2013-06-02 (×7): qty 2

## 2013-06-02 MED ORDER — EPHEDRINE SULFATE 50 MG/ML IJ SOLN
INTRAMUSCULAR | Status: DC | PRN
  Administered 2013-06-02: 10 mg via INTRAVENOUS
  Administered 2013-06-02: 15 mg via INTRAVENOUS

## 2013-06-02 MED ORDER — ONDANSETRON HCL 4 MG/2ML IJ SOLN
INTRAMUSCULAR | Status: DC | PRN
  Administered 2013-06-02: 4 mg via INTRAVENOUS

## 2013-06-02 MED ORDER — LIDOCAINE HCL (CARDIAC) 20 MG/ML IV SOLN
INTRAVENOUS | Status: DC | PRN
  Administered 2013-06-02: 100 mg via INTRAVENOUS

## 2013-06-02 MED ORDER — NALOXONE HCL 0.4 MG/ML IJ SOLN
0.4000 mg | INTRAMUSCULAR | Status: DC | PRN
  Administered 2013-06-04: 0.4 mg via INTRAVENOUS

## 2013-06-02 MED ORDER — ACETAMINOPHEN 650 MG RE SUPP: 650.0000 mg | RECTAL | Status: DC | PRN

## 2013-06-02 MED ORDER — POVIDONE-IODINE 7.5 % EX SOLN
Freq: Once | CUTANEOUS | Status: DC
  Filled 2013-06-02: qty 118

## 2013-06-02 MED ORDER — PANTOPRAZOLE SODIUM 40 MG PO TBEC
80.0000 mg | DELAYED_RELEASE_TABLET | Freq: Every day | ORAL | Status: DC
  Administered 2013-06-03 – 2013-06-05 (×2): 80 mg via ORAL
  Filled 2013-06-02 (×2): qty 2

## 2013-06-02 MED ORDER — ROCURONIUM BROMIDE 100 MG/10ML IV SOLN
INTRAVENOUS | Status: DC | PRN
  Administered 2013-06-02: 20 mg via INTRAVENOUS
  Administered 2013-06-02: 50 mg via INTRAVENOUS
  Administered 2013-06-02: 10 mg via INTRAVENOUS
  Administered 2013-06-02: 20 mg via INTRAVENOUS

## 2013-06-02 MED ORDER — HYDROMORPHONE HCL PF 1 MG/ML IJ SOLN
0.2500 mg | INTRAMUSCULAR | Status: DC | PRN
  Administered 2013-06-02 (×2): 0.5 mg via INTRAVENOUS

## 2013-06-02 MED ORDER — DIPHENHYDRAMINE HCL 12.5 MG/5ML PO ELIX: 12.5000 mg | ORAL_SOLUTION | Freq: Four times a day (QID) | ORAL | Status: DC | PRN

## 2013-06-02 MED ORDER — THROMBIN 20000 UNITS EX SOLR
CUTANEOUS | Status: DC | PRN
  Administered 2013-06-02: 08:00:00

## 2013-06-02 MED ORDER — SENNOSIDES-DOCUSATE SODIUM 8.6-50 MG PO TABS: 1.0000 | ORAL_TABLET | Freq: Every evening | ORAL | Status: DC | PRN

## 2013-06-02 MED ORDER — NEOSTIGMINE METHYLSULFATE 1 MG/ML IJ SOLN
INTRAMUSCULAR | Status: DC | PRN
  Administered 2013-06-02: 4 mg via INTRAVENOUS

## 2013-06-02 MED ORDER — METHYLENE BLUE 1 % INJ SOLN
INTRAMUSCULAR | Status: AC
  Filled 2013-06-02: qty 10

## 2013-06-02 MED ORDER — MORPHINE SULFATE 2 MG/ML IJ SOLN
1.0000 mg | INTRAMUSCULAR | Status: DC | PRN
  Administered 2013-06-03 – 2013-06-04 (×3): 2 mg via INTRAVENOUS
  Filled 2013-06-02 (×3): qty 1

## 2013-06-02 MED ORDER — ACETAMINOPHEN 325 MG PO TABS
650.0000 mg | ORAL_TABLET | ORAL | Status: DC | PRN
  Administered 2013-06-03 – 2013-06-05 (×4): 650 mg via ORAL
  Filled 2013-06-02 (×4): qty 2

## 2013-06-02 MED ORDER — MORPHINE SULFATE (PF) 1 MG/ML IV SOLN
INTRAVENOUS | Status: AC
  Filled 2013-06-02: qty 25

## 2013-06-02 MED ORDER — METHOCARBAMOL 500 MG PO TABS
ORAL_TABLET | ORAL | Status: AC
  Filled 2013-06-02: qty 1

## 2013-06-02 MED ORDER — MIDAZOLAM HCL 2 MG/2ML IJ SOLN: 1.0000 mg | INTRAMUSCULAR | Status: DC | PRN

## 2013-06-02 MED ORDER — GABAPENTIN 400 MG PO CAPS
800.0000 mg | ORAL_CAPSULE | Freq: Three times a day (TID) | ORAL | Status: DC
  Administered 2013-06-02 – 2013-06-05 (×3): 800 mg via ORAL
  Filled 2013-06-02 (×11): qty 2

## 2013-06-02 MED ORDER — DIAZEPAM 5 MG PO TABS: 5.0000 mg | ORAL_TABLET | Freq: Four times a day (QID) | ORAL | Status: DC | PRN

## 2013-06-02 MED ORDER — 0.9 % SODIUM CHLORIDE (POUR BTL) OPTIME
TOPICAL | Status: DC | PRN
  Administered 2013-06-02 (×2): 1000 mL

## 2013-06-02 MED ORDER — HYDROMORPHONE HCL PF 1 MG/ML IJ SOLN
INTRAMUSCULAR | Status: AC
  Filled 2013-06-02: qty 1

## 2013-06-02 MED ORDER — OXYCODONE HCL 5 MG/5ML PO SOLN: 5.0000 mg | Freq: Once | ORAL | Status: DC | PRN

## 2013-06-02 MED ORDER — PHENOL 1.4 % MT LIQD: 1.0000 | OROMUCOSAL | Status: DC | PRN

## 2013-06-02 MED ORDER — OXYCODONE-ACETAMINOPHEN 5-325 MG PO TABS
1.0000 | ORAL_TABLET | ORAL | Status: DC | PRN
  Administered 2013-06-03 – 2013-06-04 (×4): 2 via ORAL
  Filled 2013-06-02 (×5): qty 2

## 2013-06-02 MED ORDER — LACTATED RINGERS IV SOLN
INTRAVENOUS | Status: DC | PRN
  Administered 2013-06-02 (×3): via INTRAVENOUS

## 2013-06-02 MED ORDER — BUPIVACAINE-EPINEPHRINE 0.25% -1:200000 IJ SOLN
INTRAMUSCULAR | Status: DC | PRN
  Administered 2013-06-02: 25 mL

## 2013-06-02 MED ORDER — MORPHINE SULFATE (PF) 1 MG/ML IV SOLN
INTRAVENOUS | Status: DC
Start: 2013-06-02 — End: 2013-06-03
  Administered 2013-06-02 (×2): via INTRAVENOUS
  Administered 2013-06-03: 3.44 mg via INTRAVENOUS
  Administered 2013-06-03: 07:00:00 via INTRAVENOUS
  Administered 2013-06-03: 4.5 mg via INTRAVENOUS
  Administered 2013-06-03: 1.5 mg via INTRAVENOUS
  Administered 2013-06-03: 11 mg via INTRAVENOUS
  Filled 2013-06-02 (×2): qty 25

## 2013-06-02 MED ORDER — SODIUM CHLORIDE 0.9 % IJ SOLN
3.0000 mL | Freq: Two times a day (BID) | INTRAMUSCULAR | Status: DC
  Administered 2013-06-04 (×2): 3 mL via INTRAVENOUS

## 2013-06-02 MED ORDER — BENAZEPRIL HCL 20 MG PO TABS
20.0000 mg | ORAL_TABLET | Freq: Every day | ORAL | Status: DC
  Administered 2013-06-03 – 2013-06-05 (×2): 20 mg via ORAL
  Filled 2013-06-02 (×4): qty 1

## 2013-06-02 MED ORDER — AMITRIPTYLINE HCL 75 MG PO TABS
150.0000 mg | ORAL_TABLET | Freq: Every day | ORAL | Status: DC
  Administered 2013-06-02 – 2013-06-04 (×3): 150 mg via ORAL
  Filled 2013-06-02 (×4): qty 2

## 2013-06-02 MED ORDER — MIDAZOLAM HCL 5 MG/5ML IJ SOLN
INTRAMUSCULAR | Status: DC | PRN
  Administered 2013-06-02: 2 mg via INTRAVENOUS

## 2013-06-02 MED ORDER — PHENYLEPHRINE HCL 10 MG/ML IJ SOLN
10.0000 mg | INTRAVENOUS | Status: DC | PRN
  Administered 2013-06-02: 25 ug/min via INTRAVENOUS

## 2013-06-02 MED ORDER — GLYCOPYRROLATE 0.2 MG/ML IJ SOLN
INTRAMUSCULAR | Status: DC | PRN
  Administered 2013-06-02: 0.6 mg via INTRAVENOUS

## 2013-06-02 MED ORDER — SODIUM CHLORIDE 0.9 % IV SOLN
INTRAVENOUS | Status: DC
  Administered 2013-06-02 – 2013-06-04 (×2): via INTRAVENOUS

## 2013-06-02 MED ORDER — MENTHOL 3 MG MT LOZG: 1.0000 | LOZENGE | OROMUCOSAL | Status: DC | PRN

## 2013-06-02 MED ORDER — BUPIVACAINE-EPINEPHRINE PF 0.25-1:200000 % IJ SOLN
INTRAMUSCULAR | Status: AC
  Filled 2013-06-02: qty 30

## 2013-06-02 MED ORDER — METHYLENE BLUE 1 % INJ SOLN
INTRAMUSCULAR | Status: DC | PRN
  Administered 2013-06-02: .5 mL

## 2013-06-02 MED ORDER — METOPROLOL SUCCINATE ER 50 MG PO TB24
50.0000 mg | ORAL_TABLET | Freq: Every day | ORAL | Status: DC
  Administered 2013-06-03 – 2013-06-05 (×2): 50 mg via ORAL
  Filled 2013-06-02 (×3): qty 1

## 2013-06-02 MED ORDER — PHENYLEPHRINE HCL 10 MG/ML IJ SOLN
INTRAMUSCULAR | Status: DC | PRN
  Administered 2013-06-02: 120 ug via INTRAVENOUS
  Administered 2013-06-02: 80 ug via INTRAVENOUS

## 2013-06-02 MED ORDER — FLEET ENEMA 7-19 GM/118ML RE ENEM: 1.0000 | ENEMA | Freq: Once | RECTAL | Status: AC | PRN

## 2013-06-02 MED ORDER — OXYCODONE HCL 5 MG PO TABS: 5.0000 mg | ORAL_TABLET | Freq: Once | ORAL | Status: DC | PRN

## 2013-06-02 SURGICAL SUPPLY — 80 items
APL SKNCLS STERI-STRIP NONHPOA (GAUZE/BANDAGES/DRESSINGS) ×1
BENZOIN TINCTURE PRP APPL 2/3 (GAUZE/BANDAGES/DRESSINGS) ×2 IMPLANT
BLADE SURG ROTATE 9660 (MISCELLANEOUS) ×1 IMPLANT
BUR ROUND PRECISION 4.0 (BURR) ×2 IMPLANT
CAGE CONCORDE BULLET 9X10X27 (Cage) ×1 IMPLANT
CARTRIDGE OIL MAESTRO DRILL (MISCELLANEOUS) ×2 IMPLANT
CLOTH BEACON ORANGE TIMEOUT ST (SAFETY) ×2 IMPLANT
CLSR STERI-STRIP ANTIMIC 1/2X4 (GAUZE/BANDAGES/DRESSINGS) ×1 IMPLANT
CONT SPEC STER OR (MISCELLANEOUS) ×2 IMPLANT
CORDS BIPOLAR (ELECTRODE) ×2 IMPLANT
COVER MAYO STAND STRL (DRAPES) ×1 IMPLANT
COVER SURGICAL LIGHT HANDLE (MISCELLANEOUS) ×2 IMPLANT
DIFFUSER DRILL AIR PNEUMATIC (MISCELLANEOUS) ×3 IMPLANT
DRAIN CHANNEL 15F RND FF W/TCR (WOUND CARE) ×1 IMPLANT
DRAPE C-ARM 42X72 X-RAY (DRAPES) ×2 IMPLANT
DRAPE ORTHO SPLIT 77X108 STRL (DRAPES) ×2
DRAPE POUCH INSTRU U-SHP 10X18 (DRAPES) ×2 IMPLANT
DRAPE SURG 17X23 STRL (DRAPES) ×6 IMPLANT
DRAPE SURG ORHT 6 SPLT 77X108 (DRAPES) ×1 IMPLANT
DURAPREP 26ML APPLICATOR (WOUND CARE) ×2 IMPLANT
ELECT BLADE 4.0 EZ CLEAN MEGAD (MISCELLANEOUS) ×2
ELECT CAUTERY BLADE 6.4 (BLADE) ×2 IMPLANT
ELECT REM PT RETURN 9FT ADLT (ELECTROSURGICAL) ×2
ELECTRODE BLDE 4.0 EZ CLN MEGD (MISCELLANEOUS) ×1 IMPLANT
ELECTRODE REM PT RTRN 9FT ADLT (ELECTROSURGICAL) ×1 IMPLANT
EVACUATOR SILICONE 100CC (DRAIN) ×1 IMPLANT
GAUZE SPONGE 4X4 16PLY XRAY LF (GAUZE/BANDAGES/DRESSINGS) ×5 IMPLANT
GLOVE BIO SURGEON STRL SZ7 (GLOVE) ×3 IMPLANT
GLOVE BIO SURGEON STRL SZ8 (GLOVE) ×2 IMPLANT
GLOVE BIOGEL PI IND STRL 7.5 (GLOVE) ×1 IMPLANT
GLOVE BIOGEL PI IND STRL 8 (GLOVE) ×1 IMPLANT
GLOVE BIOGEL PI INDICATOR 7.5 (GLOVE) ×1
GLOVE BIOGEL PI INDICATOR 8 (GLOVE) ×1
GOWN STRL NON-REIN LRG LVL3 (GOWN DISPOSABLE) ×3 IMPLANT
GOWN STRL REIN XL XLG (GOWN DISPOSABLE) ×3 IMPLANT
IV CATH 14GX2 1/4 (CATHETERS) ×2 IMPLANT
KIT BASIN OR (CUSTOM PROCEDURE TRAY) ×2 IMPLANT
KIT POSITION SURG JACKSON T1 (MISCELLANEOUS) ×1 IMPLANT
KIT ROOM TURNOVER OR (KITS) ×2 IMPLANT
MARKER SKIN DUAL TIP RULER LAB (MISCELLANEOUS) ×2 IMPLANT
NDL HYPO 25GX1X1/2 BEV (NEEDLE) ×1 IMPLANT
NDL SPNL 18GX3.5 QUINCKE PK (NEEDLE) ×2 IMPLANT
NEEDLE BONE MARROW 8GX6 FENEST (NEEDLE) ×1 IMPLANT
NEEDLE HYPO 22GX1.5 SAFETY (NEEDLE) ×1 IMPLANT
NEEDLE HYPO 25GX1X1/2 BEV (NEEDLE) ×2 IMPLANT
NEEDLE SPNL 18GX3.5 QUINCKE PK (NEEDLE) ×4 IMPLANT
NS IRRIG 1000ML POUR BTL (IV SOLUTION) ×2 IMPLANT
OIL CARTRIDGE MAESTRO DRILL (MISCELLANEOUS) ×2
PACK LAMINECTOMY ORTHO (CUSTOM PROCEDURE TRAY) ×2 IMPLANT
PACK UNIVERSAL I (CUSTOM PROCEDURE TRAY) ×2 IMPLANT
PACK VITOSS BIOACTIVE 10CC (Neuro Prosthesis/Implant) ×1 IMPLANT
PAD ARMBOARD 7.5X6 YLW CONV (MISCELLANEOUS) ×3 IMPLANT
PATTIES SURGICAL .5 X1 (DISPOSABLE) ×2 IMPLANT
PATTIES SURGICAL .5X1.5 (GAUZE/BANDAGES/DRESSINGS) ×2 IMPLANT
ROD PRE BENT EXP 40MM (Rod) ×1 IMPLANT
ROD PRE LORDOSED 5.5X45 (Rod) ×1 IMPLANT
SCREW POLYAXIAL 7X45MM (Screw) ×4 IMPLANT
SCREW SET SINGLE INNER (Screw) ×4 IMPLANT
SPONGE GAUZE 4X4 12PLY (GAUZE/BANDAGES/DRESSINGS) ×2 IMPLANT
SPONGE INTESTINAL PEANUT (DISPOSABLE) ×2 IMPLANT
SPONGE SURGIFOAM ABS GEL 100 (HEMOSTASIS) ×2 IMPLANT
STRIP CLOSURE SKIN 1/2X4 (GAUZE/BANDAGES/DRESSINGS) ×3 IMPLANT
SURGIFLO TRUKIT (HEMOSTASIS) IMPLANT
SUT ETHILON 2 0 FS 18 (SUTURE) ×1 IMPLANT
SUT MNCRL AB 4-0 PS2 18 (SUTURE) ×3 IMPLANT
SUT VIC AB 0 CT1 18XCR BRD 8 (SUTURE) ×1 IMPLANT
SUT VIC AB 0 CT1 8-18 (SUTURE) ×2
SUT VIC AB 1 CT1 18XCR BRD 8 (SUTURE) ×2 IMPLANT
SUT VIC AB 1 CT1 8-18 (SUTURE) ×2
SUT VIC AB 2-0 CT2 18 VCP726D (SUTURE) ×2 IMPLANT
SYR 20CC LL (SYRINGE) ×1 IMPLANT
SYR BULB IRRIGATION 50ML (SYRINGE) ×2 IMPLANT
SYR CONTROL 10ML LL (SYRINGE) ×4 IMPLANT
SYR TB 1ML LUER SLIP (SYRINGE) ×2 IMPLANT
TAPE CLOTH SURG 4X10 WHT LF (GAUZE/BANDAGES/DRESSINGS) ×1 IMPLANT
TOWEL OR 17X24 6PK STRL BLUE (TOWEL DISPOSABLE) ×2 IMPLANT
TOWEL OR 17X26 10 PK STRL BLUE (TOWEL DISPOSABLE) ×2 IMPLANT
TRAY FOLEY CATH 16FRSI W/METER (SET/KITS/TRAYS/PACK) ×2 IMPLANT
WATER STERILE IRR 1000ML POUR (IV SOLUTION) ×2 IMPLANT
YANKAUER SUCT BULB TIP NO VENT (SUCTIONS) ×2 IMPLANT

## 2013-06-02 NOTE — Anesthesia Preprocedure Evaluation (Addendum)
Anesthesia Evaluation  Patient identified by MRN, date of birth, ID band Patient awake    Reviewed: Allergy & Precautions, H&P , NPO status , Patient's Chart, lab work & pertinent test results  History of Anesthesia Complications Negative for: history of anesthetic complications  Airway Mallampati: II TM Distance: >3 FB Neck ROM: Full    Dental  (+) Teeth Intact and Dental Advisory Given   Pulmonary sleep apnea , Current Smoker,  breath sounds clear to auscultation        Cardiovascular hypertension, Pt. on medications Rhythm:Regular Rate:Normal  signif elevated BP this AM   Neuro/Psych H/O C5-7 spinal injury Pt uses cane to ambulate. Weakness in L hand- unable to open fist and move digits. R arm weakness but able to move right hand. Bilateral LE and lumbar pain.     GI/Hepatic GERD-  ,  Endo/Other    Renal/GU      Musculoskeletal   Abdominal (+) + obese,   Peds  Hematology   Anesthesia Other Findings   Reproductive/Obstetrics                          Anesthesia Physical Anesthesia Plan  ASA: [name]  Anesthesia Plan: General   Post-op Pain Management:    Induction: Intravenous  Airway Management Planned: Oral ETT and Video Laryngoscope Planned  Additional Equipment:   Intra-op Plan:   Post-operative Plan: Extubation in OR  Informed Consent: I have reviewed the patients History and Physical, chart, labs and discussed the procedure including the risks, benefits and alternatives for the proposed anesthesia with the patient or authorized representative who has indicated his/her understanding and acceptance.     Plan Discussed with: CRNA and Surgeon  Anesthesia Plan Comments:         Anesthesia Quick Evaluation

## 2013-06-02 NOTE — Progress Notes (Signed)
Spoke with Dr Chaney Malling and reported BP this am of 165/110.  Stated they will just treat it in the OR.

## 2013-06-02 NOTE — Anesthesia Procedure Notes (Signed)
Procedure Name: Intubation Date/Time: 06/02/2013 7:27 AM Performed by: Orvilla Fus A Pre-anesthesia Checklist: Patient identified, Timeout performed, Emergency Drugs available, Suction available and Patient being monitored Patient Re-evaluated:Patient Re-evaluated prior to inductionOxygen Delivery Method: Circle system utilized Preoxygenation: Pre-oxygenation with 100% oxygen Intubation Type: IV induction Ventilation: Mask ventilation without difficulty and Oral airway inserted - appropriate to patient size Laryngoscope Size: Mac and 4 Grade View: Grade II Tube type: Oral Tube size: 7.5 mm Number of attempts: 1 Airway Equipment and Method: Stylet Placement Confirmation: ETT inserted through vocal cords under direct vision,  breath sounds checked- equal and bilateral and positive ETCO2 Secured at: 23 cm Tube secured with: Tape Dental Injury: Teeth and Oropharynx as per pre-operative assessment

## 2013-06-02 NOTE — Transfer of Care (Signed)
Immediate Anesthesia Transfer of Care Note  Patient: Ralph [name]  Procedure(s) Performed: Procedure(s) with comments: TRANSFORAMINAL LUMBAR INTERBODY FUSION (TLIF) WITH PEDICLE SCREW FIXATION 1 LEVEL (Left) - Left sided lumbar 4-5 transforaminal lumbar interbody fusion with instrumentation, vitoss, bone marrow aspirate.  Patient Location: PACU  Anesthesia Type:General  Level of Consciousness: awake, alert  and oriented  Airway & Oxygen Therapy: Patient Spontanous Breathing and Patient connected to nasal cannula oxygen  Post-op Assessment: Report given to PACU RN, Post -op Vital signs reviewed and stable and Patient moving all extremities  Post vital signs: Reviewed and stable  Complications: No apparent anesthesia complications

## 2013-06-02 NOTE — Preoperative (Signed)
Beta Blockers   Reason not to administer Beta Blockers:Not Applicable 

## 2013-06-03 ENCOUNTER — Encounter (HOSPITAL_COMMUNITY): Payer: Self-pay | Admitting: General Practice

## 2013-06-03 MED FILL — Sodium Chloride IV Soln 0.9%: INTRAVENOUS | Qty: 1000 | Status: AC

## 2013-06-03 MED FILL — Heparin Sodium (Porcine) Inj 1000 Unit/ML: INTRAMUSCULAR | Qty: 30 | Status: AC

## 2013-06-03 NOTE — Op Note (Signed)
NAMEKERRIGAN, GLENDENING NO.:  [id number]  MEDICAL RECORD NO.:  [id number]  LOCATION:  5N05C                        FACILITY:  [facility]  PHYSICIAN:  Estill Bamberg, MD      DATE OF BIRTH:  1965-12-01  DATE OF PROCEDURE:  06/02/2013                              OPERATIVE REPORT   PREOPERATIVE DIAGNOSES: 1. L4-5 spinal stenosis. 2. 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 3. Left greater than right leg pain.  POSTOPERATIVE DIAGNOSES: 1. L4-5 spinal stenosis. 2. 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 3. Left greater than right leg pain.  PROCEDURE: 1. Left-sided L4-5 transforaminal lumbar interbody fusion. 2. Right-sided L4-5 posterolateral fusion. 3. Right-sided L4-5 laminotomy with partial facetectomy and     foraminotomy. 4. Displacement of the posterior instrumentation L4, L5, (7 x 45 mm     EXPEDIUM screws). 5. Insertion of interbody device x1 (10 x 27 mm interbody spacer). 6. Intraoperative bone marrow aspiration from the patient's right     iliac crest using a separate incision. 7. Intraoperative use of fluoroscopy.  SURGEON:  Estill Bamberg, MD.  ASSISTANTJason Coop, PA-C  ANESTHESIA:  General endotracheal anesthesia.  COMPLICATIONS:  None.  DISPOSITION:  Stable.  ESTIMATED BLOOD LOSS:  150 mL.  INDICATIONS FOR PROCEDURE:  Briefly, Mr. Greenup is a pleasant 47 year old male who did initially present to me on April 28, 2013, with bilateral leg pain, with the left side being more affected than the right.  The patient did have an MRI, which was clearly notable for L4-5 spinal stenosis.  The patient did fail multiple forms of conservative care including epidural injections, therapy, and various medications. The patient did continue to have pain.  Given the patient's ongoing failure of conservative care, in addition to his pathology reflected above, we did discuss proceeding with the procedure reflected above. The patient did fully  understand the risks and limitations of the procedure as outlined in my preoperative note.  OPERATIVE DETAILS:  On June 02, 2013, the patient was brought to surgery and general endotracheal anesthesia was administered.  The patient was placed prone on a well-padded flat Jackson bed with a Wilson frame.  Antibiotics were given and a time-out procedure was performed. The back was prepped and draped in the usual sterile fashion.  I then made a midline incision overlying the L4-5 interspace.  The lamina of L4 and L5 was identified and subperiosteally exposed.  The transverse processes were bilaterally subperiosteally exposed.  I then turned to the patient's right side.  The posterolateral gutter and the right-sided L4-5 facet joint was decorticated using a 4-mm high-speed bur.  I then used a 4-mm bur followed by a gearshift probe followed by a 6 mm tap, to cannulate the L4 and L5 pedicles using anatomic landmarks.  The ball- tipped probe did confirm that there was no cortical violation.  A 7 x 45 mm screws were placed, distraction was applied, and a 45 mm rod was placed in distraction.  Caps were placed.  I then turned my attention towards the patient's left side.  Again, the L4 and L5 pedicles were cannulated in the manner previously described.  Bone wax was placed into place.  I  then went forward with a full facetectomy on the left side. The left L4-5 intervertebral disk was noted.  I did use a 15-blade knife to perform an annulotomy at the posterolateral aspect of the disk with an assistant holding medial retraction of the traversing L5 nerve.  I then used a series of curettes and pituitary rongeurs to perform a thorough and complete diskectomy.  The endplates were thoroughly prepared.  I then placed a series of trials and did feel that a 10 mm x 27 mm parallel interbody spacer would be the most appropriate fit.  This was confirmed using lateral intraoperative fluoroscopy.  At this  point, I did make a separate incision overlying the patient's right iliac crest and I did aspirate 8 mL of bone marrow aspirate from the patient's right iliac crest using a separate incision.  The 8 mL of bone marrow aspirate was mixed with 10 mL of Vitoss BA.  This was mixed with the autograft obtained from removing the facet joint on the left side.  I then liberally packed the intervertebral space with the bone graft mixture. The interbody spacer was then tamped into position.  I was extremely pleased with the press-fit of the implant.  I then placed 7 x 45 mm screws into the L4 and L5 pedicles on the left.  A 40 mm rod was placed. Distraction was discontinued on the contralateral side.  Compression was then applied on the left side.  A 40 mm rod was placed followed by caps and a final locking procedure.  Of note, I did use triggered EMG to test the screws on the left, and there was no screw that tested below 30 milliamps.  I then performed a final locking procedure of the caps on the patient's right side.  At this point, the wound was copiously irrigated.  I did a place #15 deep Blake drain deep to the fascia.  I then closed the fascia using #1 Vicryl.  The subcutaneous layer was then closed using 2-0 Vicryl and the skin was closed using 3-0 Monocryl. Benzoin and Steri-Strips were applied followed by sterile dressing.  All instrument counts were correct at the termination of the procedure.  Of note, there was no abnormal EMG activity encountered throughout the entirety of the procedure.  There was no extravasation of cerebrospinal fluid noted throughout the surgery.  Of note, Jason Coop was my assistant throughout the entire surgery, and did aid in essential retraction and suctioning needed throughout the surgery.     Estill Bamberg, MD     MD/MEDQ  D:  06/02/2013  T:  06/03/2013  Job:  [id number]  cc:   Dr. Christoper Fabian, MD

## 2013-06-03 NOTE — Evaluation (Signed)
Occupational Therapy Evaluation Patient Details Name: Ralph [name] MRN: [id number] DOB: February 21, 1966 Today's Date: 06/03/2013 Time: 4098-1191 OT Time Calculation (min): 50 min  OT Assessment / Plan / Recommendation History of present illness Left-sided L4-5 transforaminal lumbar interbody fusion; Right-sided L4-5 posterolateral fusion; Right-sided L4-5 laminotomy with partial facetectomy and foraminotomy; Displacement of the posterior instrumentation L4, L5, (7 x 45 mm EXPEDIUM screws). Insertion of interbody device x1 (10 x 27 mm interbody spacer). Intraoperative bone marrow aspiration from the patient's right iliac crest using a separate incision; Intraoperative use of fluoroscopy.   Clinical Impression   This 47 yo male s/p above presents to acute OT with deficits below further complicated by quadraparesis. Will benefit from acute OT with follow up on CIR to get to an intermittent S level (with mainly only A for his TLSO brace).    OT Assessment  Patient needs continued OT Services    Follow Up Recommendations  CIR       Equipment Recommendations  Tub/shower bench;3 in 1 bedside comode       Frequency  Min 3X/week    Precautions / Restrictions Precautions Precautions: Back Precaution Booklet Issued: Yes (comment) Required Braces or Orthoses: Spinal Brace Spinal Brace: Thoracolumbosacral orthotic;Applied in sitting position Restrictions Weight Bearing Restrictions: No   Pertinent Vitals/Pain 7/10 back    ADL  Eating/Feeding: Modified independent Where Assessed - Eating/Feeding: Chair Grooming: Set up Where Assessed - Grooming: Supported sitting Upper Body Bathing: Minimal assistance Where Assessed - Upper Body Bathing: Supported sitting Lower Body Bathing: Maximal assistance Where Assessed - Lower Body Bathing: Supported sit to stand Upper Body Dressing: Moderate assistance Where Assessed - Upper Body Dressing: Supported sitting Lower Body Dressing: +1 Total  assistance Where Assessed - Lower Body Dressing: Supported sit to Pharmacist, hospital: +2 Total assistance Toilet Transfer: Patient Percentage: 60% Statistician Method: Sit to Barista:  (Bed (raised)>out door down hall>recliner behind him) Therapist, nutritional and Hygiene: Maximal assistance Where Assessed - Engineer, mining and Hygiene: Sit to stand from 3-in-1 or toilet Equipment Used: Gait belt;Rolling walker;Back brace Transfers/Ambulation Related to ADLs: total A +2 (pt=60%) with RW    OT Diagnosis: Generalized weakness;Acute pain;Paresis  OT Problem List: Decreased strength;Decreased range of motion;Impaired balance (sitting and/or standing);Pain;Decreased knowledge of precautions;Decreased knowledge of use of DME or AE;Impaired UE functional use OT Treatment Interventions: Self-care/ADL training;DME and/or AE instruction;Patient/family education;Balance training   OT Goals(Current goals can be found in the care plan section) Acute Rehab OT Goals OT Goal Formulation: With patient/family Time For Goal Achievement: 06/10/13 Potential to Achieve Goals: Good  Visit Information  Last OT Received On: 06/03/13 Assistance Needed: +2 PT/OT Co-Evaluation/Treatment: Yes History of Present Illness: Left-sided L4-5 transforaminal lumbar interbody fusion; Right-sided L4-5 posterolateral fusion; Right-sided L4-5 laminotomy with partial facetectomy and foraminotomy; Displacement of the posterior instrumentation L4, L5, (7 x 45 mm EXPEDIUM screws). Insertion of interbody device x1 (10 x 27 mm interbody spacer). Intraoperative bone marrow aspiration from the patient's right iliac crest using a separate incision; Intraoperative use of fluoroscopy.       Prior Functioning     Home Living Family/patient expects to be discharged to:: Private residence Living Arrangements: Spouse/significant other Available Help at Discharge: Family;Available  24 hours/day Type of Home: House Home Access: Stairs to enter [employer] of Steps: 1 Entrance Stairs-Rails: None Home Layout: One level Home Equipment: Walker - 2 wheels Prior Function Level of Independence: Independent Communication Communication: No difficulties Dominant Hand: Right  Vision/Perception Vision - History Patient Visual Report: No change from baseline   Cognition  Cognition Arousal/Alertness: Awake/alert Behavior During Therapy: WFL for tasks assessed/performed Overall Cognitive Status: Within Functional Limits for tasks assessed    Extremity/Trunk Assessment Upper Extremity Assessment Upper Extremity Assessment: RUE deficits/detail;LUE deficits/detail RUE Deficits / Details: from 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 LUE Deficits / Details: from old SCI; cannot open fingers own his own volition     Mobility Bed Mobility Bed Mobility: Rolling Left;Left Sidelying to Sit;Sitting - Scoot to Edge of Bed Rolling Left: 3: Mod assist;With rail Left Sidelying to Sit: 3: Mod assist;With rails;HOB flat Sitting - Scoot to Edge of Bed: 4: Min guard Transfers Transfers: Sit to Stand;Stand to Sit Sit to Stand: 1: +2 Total assist;With upper extremity assist;From bed Sit to Stand: Patient Percentage: 60% Stand to Sit: 1: +2 Total assist;With upper extremity assist;With armrests;To chair/3-in-1 Stand to Sit: Patient Percentage: 60% Details for Transfer Assistance: VCs for safe hand placement           End of Session OT - End of Session Equipment Utilized During Treatment: Gait belt;Rolling walker;Back brace Activity Tolerance: Patient tolerated treatment well Patient left: in chair;with call bell/phone within reach;with family/visitor present Nurse Communication: Mobility status       Evette Georges [phone] 06/03/2013, 1:50 PM

## 2013-06-03 NOTE — Progress Notes (Signed)
Rehab Admissions Coordinator Note:  Patient was screened by Clois Dupes for appropriateness for an Inpatient Acute Rehab Consult.  At this time, we are recommending Inpatient Rehab consult IF pt fails to progress to level to be able to d/c home this weekend.  Clois Dupes 06/03/2013, 2:57 PM  I can be reached at [phone].

## 2013-06-03 NOTE — Anesthesia Postprocedure Evaluation (Signed)
  Anesthesia Post-op Note  Patient: Ralph [name]  Procedure(s) Performed: Procedure(s) with comments: TRANSFORAMINAL LUMBAR INTERBODY FUSION (TLIF) WITH PEDICLE SCREW FIXATION 1 LEVEL (Left) - Left sided lumbar 4-5 transforaminal lumbar interbody fusion with instrumentation, vitoss, bone marrow aspirate.  Patient Location: PACU  Anesthesia Type:General  Level of Consciousness: awake and alert   Airway and Oxygen Therapy: Patient Spontanous Breathing  Post-op Pain: mild  Post-op Assessment: Post-op Vital signs reviewed, Patient's Cardiovascular Status Stable, Respiratory Function Stable, Patent Airway, No signs of Nausea or vomiting and Pain level controlled  Post-op Vital Signs: Reviewed and stable  Complications: No apparent anesthesia complications

## 2013-06-03 NOTE — Progress Notes (Addendum)
Pt 1 Day PO L4-5 TLIF with posterior instrumentation. He reports improvement in his L>R leg pain but has yet to be out of bed and ambulate so is not completely sure. He reports expected post op LBP and tightness and is eager to get up with PT. He did not rest very well overnight secondary to PCA and interruptions. He denies SOB, chest pain, NL Bowel and bladder control  BP 112/74  Pulse 85  Temp(Src) 97.6 F (36.4 C)  Resp 11  SpO2 94% Pt laying comfortably in hospital bed eating breakfast. SCDs in place. Dressing CDI. NVI.   Drain Output: 120 reported over 12hrs, emptied at 7am shift change, currently 25 over past hr, will maintain  1 Day PO L4-5 TLIF with posterior instrumentation  -D/C PCA  -Transition to oral meds (Percocet and Valium)  -Up with PT/OT today    -TLSO brace at all times when OOB with exception of short trips to bathroom   -Back precautions  -Likely D/C home Saturday vs Sunday pending PT/OT and pain control   -Written scripts and D/C instructions in chart   -Will continue to monitor drain, will pull tomorrow likely, stitch in place

## 2013-06-03 NOTE — Evaluation (Signed)
Physical Therapy Evaluation Patient Details Name: Ralph [name] MRN: [id number] DOB: 06-14-1966 Today's Date: 06/03/2013 Time: 3086-5784 PT Time Calculation (min): 50 min  PT Assessment / Plan / Recommendation History of Present Illness  Left-sided L4-5 transforaminal lumbar interbody fusion; Right-sided L4-5 posterolateral fusion; Right-sided L4-5 laminotomy with partial facetectomy and foraminotomy; Displacement of the posterior instrumentation L4, L5, (7 x 45 mm EXPEDIUM screws). Insertion of interbody device x1 (10 x 27 mm interbody spacer). Intraoperative bone marrow aspiration from the patient's right iliac crest using a separate incision; Intraoperative use of fluoroscopy.  Clinical Impression  Pt. Presents to PT with decrease in his usual level of functional mobility and gait due to back surgery superimposed on SCI with some residual weakness in UEs and LEs.  He will benefit from acute PT to address these and below issues and prepare him for next venue of care.  Will benefit from CIR consult.Marland Kitchen    PT Assessment  Patient needs continued PT services    Follow Up Recommendations  CIR;Supervision/Assistance - 24 hour;Supervision for mobility/OOB    Does the patient have the potential to tolerate intense rehabilitation      Barriers to Discharge        Equipment Recommendations  None recommended by PT    Recommendations for Other Services Rehab consult   Frequency Min 6X/week    Precautions / Restrictions Precautions Precautions: Back Precaution Booklet Issued: Yes (comment) Precaution Comments: Pt. was educated on back precautions and log rolling technique and given handout Required Braces or Orthoses: Spinal Brace Spinal Brace: Thoracolumbosacral orthotic;Applied in sitting position Restrictions Weight Bearing Restrictions: No   Pertinent Vitals/Pain See vitals tab       Mobility  Bed Mobility Bed Mobility: Rolling Left;Left Sidelying to Sit;Sitting - Scoot to  [place] of Bed Rolling Left: 3: Mod assist;With rail Left Sidelying to Sit: 3: Mod assist;With rails;HOB flat Sitting - Scoot to Edge of Bed: 4: Min guard Details for Bed Mobility Assistance: cues for technqiue and to maintain back precautions Transfers Transfers: Sit to Stand;Stand to Sit Sit to Stand: 1: +2 Total assist;With upper extremity assist;From bed Sit to Stand: Patient Percentage: 60% Stand to Sit: 1: +2 Total assist;With upper extremity assist;With armrests;To chair/3-in-1 Stand to Sit: Patient Percentage: 60% Details for Transfer Assistance: VCs for safe hand placement Ambulation/Gait Ambulation/Gait Assistance: 1: +2 Total assist Ambulation/Gait: Patient Percentage: 70% Ambulation Distance (Feet): 40 Feet Assistive device: Rolling walker Ambulation/Gait Assistance Details: cues for technique and to maintain back precautions.  Pt  with difficulty grippng RW with L hand due to h/o SCI.  Seconnd person helpful for safety and recliner chair. Gait Pattern: Step-to pattern;Decreased step length - right;Decreased step length - left Stairs: No    Exercises     PT Diagnosis: Difficulty walking;Abnormality of gait;Acute pain;Quadraplegia (remote h/o quadriplegia superimposed on new back surgery)  PT Problem List: Decreased activity tolerance;Decreased balance;Decreased mobility;Decreased knowledge of use of DME;Decreased knowledge of precautions;Pain PT Treatment Interventions: DME instruction;Gait training;Stair training;Functional mobility training;Therapeutic activities;Balance training;Patient/family education     PT Goals(Current goals can be found in the care plan section) Acute Rehab PT Goals Patient Stated Goal: home when well enough PT Goal Formulation: With patient Time For Goal Achievement: 06/10/13 Potential to Achieve Goals: Good  Visit Information  Last PT Received On: 06/03/13 Assistance Needed: +2 PT/OT Co-Evaluation/Treatment: Yes History of Present Illness:  Left-sided L4-5 transforaminal lumbar interbody fusion; Right-sided L4-5 posterolateral fusion; Right-sided L4-5 laminotomy with partial facetectomy and foraminotomy; Displacement of the posterior  instrumentation L4, L5, (7 x 45 mm EXPEDIUM screws). Insertion of interbody device x1 (10 x 27 mm interbody spacer). Intraoperative bone marrow aspiration from the patient's right iliac crest using a separate incision; Intraoperative use of fluoroscopy.       Prior Functioning  Home Living Family/patient expects to be discharged to:: Private residence Living Arrangements: Spouse/significant other Available Help at Discharge: Family;Available 24 hours/day Type of Home: House Home Access: Stairs to enter [employer] of Steps: 1 Entrance Stairs-Rails: None Home Layout: One level Home Equipment: Environmental consultant - 2 wheels Prior Function Level of Independence: Independent Communication Communication: No difficulties Dominant Hand: Right    Cognition  Cognition Arousal/Alertness: Awake/alert Behavior During Therapy: WFL for tasks assessed/performed Overall Cognitive Status: Within Functional Limits for tasks assessed    Extremity/Trunk Assessment Upper Extremity Assessment Upper Extremity Assessment: Defer to OT evaluation RUE Deficits / Details:  (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) LUE Deficits / Details: from old SCI; cannot open fingers own his own volition Lower Extremity Assessment Lower Extremity Assessment: Generalized weakness   Balance Balance Balance Assessed: Yes Dynamic Standing Balance Dynamic Standing - Balance Support: Bilateral upper extremity supported;During functional activity Dynamic Standing - Level of Assistance: 4: Min assist  End of Session PT - End of Session Equipment Utilized During Treatment: Gait belt;Back brace Activity Tolerance: Patient tolerated treatment well;Patient limited by fatigue Patient left: in chair;with call bell/phone within reach;with family/visitor  present Nurse Communication: Mobility status  GP     Ferman Hamming 06/03/2013, 3:51 PM Weldon Picking PT Acute Rehab Services [phone] Beeper [phone]

## 2013-06-04 LAB — CBC WITH DIFFERENTIAL/PLATELET
Basophils Absolute: 0 10*3/uL (ref 0.0–0.1)
Basophils Relative: 0 % (ref 0–1)
Eosinophils Absolute: 0 10*3/uL (ref 0.0–0.7)
Eosinophils Relative: 0 % (ref 0–5)
HCT: 37.3 % — ABNORMAL LOW (ref 39.0–52.0)
Lymphocytes Relative: 7 % — ABNORMAL LOW (ref 12–46)
Lymphs Abs: 0.9 10*3/uL (ref 0.7–4.0)
MCH: 29.8 pg (ref 26.0–34.0)
MCV: 91.9 fL (ref 78.0–100.0)
Monocytes Absolute: 1.1 10*3/uL — ABNORMAL HIGH (ref 0.1–1.0)
Monocytes Relative: 9 % (ref 3–12)
Platelets: 171 10*3/uL (ref 150–400)
RBC: 4.06 MIL/uL — ABNORMAL LOW (ref 4.22–5.81)
WBC: 12 10*3/uL — ABNORMAL HIGH (ref 4.0–10.5)

## 2013-06-04 LAB — BLOOD GAS, ARTERIAL
Acid-Base Excess: 3.6 mmol/L — ABNORMAL HIGH (ref 0.0–2.0)
Drawn by: 275531
O2 Content: 5 L/min
O2 Saturation: 97.8 %
Patient temperature: 98.6
TCO2: 30.8 mmol/L (ref 0–100)
pCO2 arterial: 56.8 mmHg — ABNORMAL HIGH (ref 35.0–45.0)
pO2, Arterial: 89.4 mmHg (ref 80.0–100.0)

## 2013-06-04 LAB — GLUCOSE, CAPILLARY: Glucose-Capillary: 144 mg/dL — ABNORMAL HIGH (ref 70–99)

## 2013-06-04 LAB — POCT I-STAT 3, ART BLOOD GAS (G3+)
Bicarbonate: 28.4 mEq/L — ABNORMAL HIGH (ref 20.0–24.0)
O2 Saturation: 96 %
Patient temperature: 98.6
TCO2: 30 mmol/L (ref 0–100)

## 2013-06-04 LAB — BASIC METABOLIC PANEL
BUN: 14 mg/dL (ref 6–23)
CO2: 30 mEq/L (ref 19–32)
Chloride: 100 mEq/L (ref 96–112)
Creatinine, Ser: 1.27 mg/dL (ref 0.50–1.35)
Glucose, Bld: 111 mg/dL — ABNORMAL HIGH (ref 70–99)

## 2013-06-04 MED ORDER — NALOXONE HCL 0.4 MG/ML IJ SOLN
INTRAMUSCULAR | Status: AC
  Administered 2013-06-04: 0.4 mg
  Filled 2013-06-04: qty 1

## 2013-06-04 MED ORDER — TRAMADOL HCL 50 MG PO TABS
50.0000 mg | ORAL_TABLET | Freq: Four times a day (QID) | ORAL | Status: DC | PRN
  Administered 2013-06-04: 50 mg via ORAL
  Filled 2013-06-04: qty 1

## 2013-06-04 MED ORDER — NALOXONE HCL 0.4 MG/ML IJ SOLN
INTRAMUSCULAR | Status: AC
  Administered 2013-06-04: 0.4 mg via INTRAVENOUS
  Filled 2013-06-04: qty 1

## 2013-06-04 NOTE — Progress Notes (Signed)
Pt places self on and off CPAP. RT made pt aware that if he had any questions or problems to please call.

## 2013-06-04 NOTE — Progress Notes (Signed)
PATIENT ID: Ralph [name]  MRN: [id number]  DOB/AGE:  [birth year] / 47 y.o.  2 Days Post-Op Procedure(s) (LRB): TRANSFORAMINAL LUMBAR INTERBODY FUSION (TLIF) WITH PEDICLE SCREW FIXATION 1 LEVEL (Left)    PROGRESS NOTE Subjective:   Patient is breathing and responsive to pain/sternal rub when I enter the room.  Per nursing staff, the patient was up and went to the restroom at 6:00 this morning and complained of severe pain.  He was then giving his oral pain medication and also a dose of morphine.  When I entered the room at 745: Patient was again unresponsive to verbal commands and snoring.  We were unable to arouse the patient and he was given 2 doses of Narcan.   The rapid response team was called by the nurse and unfortunately they were busy with 3-4 other cases.  Respiratory was then called to get some ABGs and have them work on the patient's CPAP machine as there was an error with the machine and he  Had to not to put it back on once he went back to sleep at 6:00 AM.  Once the patient was back on oxygen, his oxygen saturation increased rapidly to around the mid 90s.   Over the course of approximately one hour the patient became increasingly responsive and was verbally responsive and was opening his eyes to command.  Per the family, the patient hasno Nausea, no Vomiting, yes passing gas, yes Bowel Movement. Taking PO well. Denies SOB, Chest or Calf Pain. Using Incentive Spirometer, PAS in place. Ambulate WBAT, Patient reports pain as severe,     Objective: Vital signs in last 24 hours: Temp:  [98.9 F (37.2 C)-101.1 F (38.4 C)] 98.9 F (37.2 C) (11/01 0558) Pulse Rate:  [85-94] 92 (11/01 0558) Resp:  [17-18] 18 (11/01 0558) BP: (100-125)/(42-70) 109/70 mmHg (11/01 0832) SpO2:  [42 %-99 %] 98 % (11/01 0832)    Intake/Output from previous day: I/O last 3 completed shifts: In: 330 [P.O.:330] Out: 2480 [Urine:2050; Drains:430]   Intake/Output this shift:     LABORATORY DATA:  Recent  Labs  06/04/13 0840  GLUCAP 144*    Examination: Intact pulses distally Incision: no drainage No cellulitis present} Blood and plasma separated in drain indicating minimal recent drainage, drain pulled without difficulty.  Assessment:   2 Days Post-Op Procedure(s) (LRB): TRANSFORAMINAL LUMBAR INTERBODY FUSION (TLIF) WITH PEDICLE SCREW FIXATION 1 LEVEL (Left) ADDITIONAL DIAGNOSIS:  Sleep Apnea    Plan:   Weight Bearing as Tolerated (WBAT)  DVT Prophylaxis:  SCDs  DISCHARGE PLAN: Home  DISCHARGE NEEDS: TLSO brace to be worn at all times, unless short trips to bathroom. Back precautions Discontinue IV pain medication Wishes to help with therapy today as pain permits.  Plan to discharge him tomorrow if patient continues to improve. CBC and BMP ordered.     Osiris Charles R 06/04/2013, 10:41 AM

## 2013-06-04 NOTE — Progress Notes (Signed)
Physical Therapy Treatment Patient Details Name: Ralph [name] MRN: [id number] DOB: Jun 09, 1966 Today's Date: 06/04/2013 Time: 4098-1191 PT Time Calculation (min): 23 min  PT Assessment / Plan / Recommendation  History of Present Illness Left-sided L4-5 transforaminal lumbar interbody fusion; Right-sided L4-5 posterolateral fusion; Right-sided L4-5 laminotomy with partial facetectomy and foraminotomy; Displacement of the posterior instrumentation L4, L5, (7 x 45 mm EXPEDIUM screws). Insertion of interbody device x1 (10 x 27 mm interbody spacer). Intraoperative bone marrow aspiration from the patient's right iliac crest using a separate incision; Intraoperative use of fluoroscopy.   PT Comments   Pt. Is more lethargic today but responding to directions at a basic level and verbalizing answers to simple questions. He made limited progress in PT today due to this lethargy.  Wife and family at bedside and observed session which was limited to sitting at EOB due to lethargy.  RN made aware of his status with therapy session.  Pt. Assisted back to bed and positioned on left side at his request with pillows for support and to maintain back precautions.  Follow Up Recommendations  CIR;Supervision/Assistance - 24 hour;Supervision for mobility/OOB     Does the patient have the potential to tolerate intense rehabilitation     Barriers to Discharge        Equipment Recommendations  None recommended by PT    Recommendations for Other Services    Frequency Min 6X/week   Progress towards PT Goals Progress towards PT goals: Not progressing toward goals - comment (pt. lethargic likely due to pain meds per RN)  Plan Current plan remains appropriate    Precautions / Restrictions Precautions Precautions: Back Restrictions Weight Bearing Restrictions: No   Pertinent Vitals/Pain See vitals tab Pt. Did not verbalize any c/o pain during limited mobility     Mobility  Bed Mobility Bed Mobility:  Rolling Left;Left Sidelying to Sit;Sitting - Scoot to [place] of Bed;Sit to Supine Rolling Left: 3: Mod assist;With rail Left Sidelying to Sit: 1: +2 Total assist;HOB flat Left Sidelying to Sit: Patient Percentage: 50% Sitting - Scoot to Edge of Bed: 3: Mod assist Sit to Supine: 1: +2 Total assist;HOB flat Sit to Supine: Patient Percentage: 30% Details for Bed Mobility Assistance: cues for technique and safety; needes significant assist due to current level of mentation Transfers Transfers: Not assessed    Exercises     PT Diagnosis:    PT Problem List:   PT Treatment Interventions:     PT Goals (current goals can now be found in the care plan section) Acute Rehab PT Goals Patient Stated Goal: home when well enough  Visit Information  Last PT Received On: 06/04/13 Assistance Needed: +2 History of Present Illness: Left-sided L4-5 transforaminal lumbar interbody fusion; Right-sided L4-5 posterolateral fusion; Right-sided L4-5 laminotomy with partial facetectomy and foraminotomy; Displacement of the posterior instrumentation L4, L5, (7 x 45 mm EXPEDIUM screws). Insertion of interbody device x1 (10 x 27 mm interbody spacer). Intraoperative bone marrow aspiration from the patient's right iliac crest using a separate incision; Intraoperative use of fluoroscopy.    Subjective Data  Subjective: Pt. would utter a simple verbal response to basic questioning.  for example, when I asked him how he was feeling, he responded "not like much" Patient Stated Goal: home when well enough   Cognition  Cognition Arousal/Alertness: Lethargic Behavior During Therapy: Flat affect Overall Cognitive Status: Impaired/Different from baseline Area of Impairment: Attention;Awareness Current Attention Level: Sustained Memory: Decreased recall of precautions Awareness: Intellectual    Balance  Balance Balance Assessed: Yes Static Sitting Balance Static Sitting - Balance Support: Bilateral upper extremity  supported;Feet supported Static Sitting - Level of Assistance: 3: Mod assist;2: Max assist;Other (comment) (fluctuating) Static Sitting - Comment/# of Minutes: 12  End of Session PT - End of Session Activity Tolerance: Patient limited by lethargy Patient left: in bed;with call bell/phone within reach;with family/visitor present Nurse Communication: Mobility status   GP     Ferman Hamming 06/04/2013, 3:26 PM Weldon Picking PT Acute Rehab Services [phone] [place] [phone]

## 2013-06-04 NOTE — Progress Notes (Signed)
Pt placed on CPAP at 14 CMH2O per home settings via Nasal mask with 3 LPM O2 bleed in.  Pt tolerating well at this time, RT to monitor and assess as needed.

## 2013-06-05 ENCOUNTER — Inpatient Hospital Stay (HOSPITAL_COMMUNITY)
Admission: EM | Admit: 2013-06-05 | Discharge: 2013-06-09 | DRG: 175 | Disposition: A | Discharge status: Home / Self Care | Payer: Medicare Other | Attending: Internal Medicine | Admitting: Internal Medicine

## 2013-06-05 ENCOUNTER — Emergency Department (HOSPITAL_COMMUNITY): Payer: Medicare Other

## 2013-06-05 ENCOUNTER — Encounter (HOSPITAL_COMMUNITY): Payer: Self-pay | Admitting: Emergency Medicine

## 2013-06-05 DIAGNOSIS — Z9889 Other specified postprocedural states: Secondary | ICD-10-CM

## 2013-06-05 DIAGNOSIS — G8250 Quadriplegia, unspecified: Secondary | ICD-10-CM | POA: Diagnosis present

## 2013-06-05 DIAGNOSIS — I82409 Acute embolism and thrombosis of unspecified deep veins of unspecified lower extremity: Secondary | ICD-10-CM | POA: Diagnosis present

## 2013-06-05 DIAGNOSIS — Z79899 Other long term (current) drug therapy: Secondary | ICD-10-CM

## 2013-06-05 DIAGNOSIS — Z8614 Personal history of Methicillin resistant Staphylococcus aureus infection: Secondary | ICD-10-CM

## 2013-06-05 DIAGNOSIS — I10 Essential (primary) hypertension: Secondary | ICD-10-CM | POA: Diagnosis present

## 2013-06-05 DIAGNOSIS — R269 Unspecified abnormalities of gait and mobility: Secondary | ICD-10-CM

## 2013-06-05 DIAGNOSIS — G4733 Obstructive sleep apnea (adult) (pediatric): Secondary | ICD-10-CM | POA: Diagnosis present

## 2013-06-05 DIAGNOSIS — I2699 Other pulmonary embolism without acute cor pulmonale: Principal | ICD-10-CM | POA: Diagnosis present

## 2013-06-05 DIAGNOSIS — Z6832 Body mass index (BMI) 32.0-32.9, adult: Secondary | ICD-10-CM

## 2013-06-05 DIAGNOSIS — E669 Obesity, unspecified: Secondary | ICD-10-CM | POA: Diagnosis present

## 2013-06-05 DIAGNOSIS — K219 Gastro-esophageal reflux disease without esophagitis: Secondary | ICD-10-CM | POA: Diagnosis present

## 2013-06-05 DIAGNOSIS — Z86718 Personal history of other venous thrombosis and embolism: Secondary | ICD-10-CM | POA: Diagnosis present

## 2013-06-05 LAB — CBC
HCT: 34.2 % — ABNORMAL LOW (ref 39.0–52.0)
MCH: 29.9 pg (ref 26.0–34.0)
MCHC: 34.5 g/dL (ref 30.0–36.0)
MCV: 86.8 fL (ref 78.0–100.0)
RDW: 12.9 % (ref 11.5–15.5)

## 2013-06-05 LAB — BASIC METABOLIC PANEL
BUN: 10 mg/dL (ref 6–23)
Chloride: 101 mEq/L (ref 96–112)
Creatinine, Ser: 0.73 mg/dL (ref 0.50–1.35)
GFR calc Af Amer: >90 mL/min (ref >90)
Sodium: 136 mEq/L (ref 135–145)

## 2013-06-05 LAB — POCT I-STAT TROPONIN I

## 2013-06-05 MED ORDER — ASPIRIN 325 MG PO TABS
325.0000 mg | ORAL_TABLET | Freq: Once | ORAL | Status: AC
  Administered 2013-06-05: 325 mg via ORAL
  Filled 2013-06-05: qty 1

## 2013-06-05 MED ORDER — METOCLOPRAMIDE HCL 10 MG PO TABS
10.0000 mg | ORAL_TABLET | Freq: Once | ORAL | Status: AC
  Administered 2013-06-05: 10 mg via ORAL
  Filled 2013-06-05: qty 1

## 2013-06-05 NOTE — ED Provider Notes (Signed)
CSN: [id number]     Arrival date & time 06/05/13  1953 History   First MD Initiated Contact with Patient 06/05/13 2259     Chief Complaint  Patient presents with  . Shortness of Breath   (Consider location/radiation/quality/duration/timing/severity/associated sxs/prior Treatment) HPI Comments: Patient presents with a chief complaint of SOB.  He reports that he began feeling short of breath earlier this evening.  He was discharged from the hospital earlier today after having a transforaminal lumbar interbody fusion with pedicle screw fixation.  He had spinal surgery on 06/02/13.  Surgery performed by Dr. Yevette Edwards.  He reports that he was not feeling short of breath when he was discharged.  He denies any chest pain.  Denies any lower extremity edema or erythema.  He denies cough or fever.  His son has been checking his pulse ox at home with a pulse oximeter that he bought from the store and reports that his pulse ox has been dropping according to his monitor.  Patient is currently not on any anticoagulants.  Patient denies prior history of  Cardiac disease.  No prior history of DVT or PE.  PMH significant for Obstructive Sleep Apnea currently on CPAP and Quadriplegia and Quadriparesis.     Patient is a 47 y.o. male presenting with shortness of breath. The history is provided by the patient.  Shortness of Breath   Past Medical History  Diagnosis Date  . Other quadriplegia and quadriparesis   . Gait disorder   . Gastroesophageal reflux disease   . Obesity   . Lumbago   . Leg fracture, right   . Obstructive sleep apnea on CPAP   . Spinal cord injury, C5-C7   . Hypertension     does not see a cardiologist  . History of MRSA infection     wounds in Fingers and then on leg  . Hypoglycemia   . Complication of anesthesia   . PONV (postoperative nausea and vomiting)    Past Surgical History  Procedure Laterality Date  . Right anterior cruciate ligament repair    . Baclofen pump placement     . Lumbar fusion  06/02/2013    L4  L5  Dr Yevette Edwards  . Transforaminal lumbar interbody fusion (tlif) with pedicle screw fixation 1 level Left 06/02/2013    Procedure: TRANSFORAMINAL LUMBAR INTERBODY FUSION (TLIF) WITH PEDICLE SCREW FIXATION 1 LEVEL;  Surgeon: Emilee Hero, MD;  Location: [facility] OR;  Service: Orthopedics;  Laterality: Left;  Left sided lumbar 4-5 transforaminal lumbar interbody fusion with instrumentation, vitoss, bone marrow aspirate.   No family history on file. History  Substance Use Topics  . Smoking status: Never Smoker   . Smokeless tobacco: Current User    Types: Chew  . Alcohol Use: No    Review of Systems  Respiratory: Positive for shortness of breath.   All other systems reviewed and are negative.    Allergies  Review of patient's allergies indicates no known allergies.  Home Medications   Current Outpatient Rx  Name  Route  Sig  Dispense  Refill  . amitriptyline (ELAVIL) 75 MG tablet   Oral   Take 150 mg by mouth at bedtime.         . benazepril (LOTENSIN) 20 MG tablet   Oral   Take 20 mg by mouth daily.         . diazepam (VALIUM) 5 MG tablet   Oral   Take 5 mg by mouth at bedtime as needed for  sleep.         . gabapentin (NEURONTIN) 800 MG tablet   Oral   Take 800 mg by mouth 3 (three) times daily.         . L-ARGININE PO   Oral   Take by mouth.         . metoprolol succinate (TOPROL-XL) 50 MG 24 hr tablet   Oral   Take 50 mg by mouth daily. Take with or immediately following a meal.         . omeprazole (PRILOSEC) 20 MG capsule   Oral   Take 40 mg by mouth daily.         Marland Kitchen BACLOFEN IT   Intrathecal   by Intrathecal route.          BP 163/98  Pulse 64  Temp(Src) 98.2 F (36.8 C) (Oral)  Resp 13  SpO2 100% Physical Exam  Nursing note and vitals reviewed. Constitutional: He appears well-developed and well-nourished.  HENT:  Head: Normocephalic and atraumatic.  Mouth/Throat: Oropharynx is clear and  moist.  Neck: Normal range of motion. Neck supple.  Cardiovascular: Normal rate, regular rhythm and normal heart sounds.   Pulses:      Dorsalis pedis pulses are 2+ on the right side, and 2+ on the left side.  Pulmonary/Chest: Effort normal and breath sounds normal.  Abdominal: Soft. Bowel sounds are normal. He exhibits no distension and no mass. There is no tenderness. There is no rebound and no guarding.  Musculoskeletal: Normal range of motion.  No LE edema bilaterally  Neurological: He is alert.  Skin: Skin is warm and dry.  Psychiatric: He has a normal mood and affect.    ED Course  Procedures (including critical care time) Labs Review Labs Reviewed  CBC - Abnormal; Notable for the following:    RBC 3.94 (*)    Hemoglobin 11.8 (*)    HCT 34.2 (*)    All other components within normal limits  BASIC METABOLIC PANEL - Abnormal; Notable for the following:    Glucose, Bld 110 (*)    All other components within normal limits  POCT I-STAT TROPONIN I - Abnormal; Notable for the following:    Troponin i, poc 0.14 (*)    All other components within normal limits  D-DIMER, QUANTITATIVE   Imaging Review Dg Chest 2 View  06/05/2013   CLINICAL DATA:  Shortness of breath.  EXAM: CHEST  2 VIEW  COMPARISON:  CHEST x-ray 05/27/2013.  FINDINGS: Lung volumes are normal. No consolidative airspace disease. No pleural effusions. No pneumothorax. No pulmonary nodule or mass noted. Pulmonary vasculature and the cardiomediastinal silhouette are within normal limits.  IMPRESSION: 1.  No radiographic evidence of acute cardiopulmonary disease.   Electronically Signed   By: Trudie Reed M.D.   On: 06/05/2013 22:28    EKG Interpretation     Ventricular Rate:  67 PR Interval:  194 QRS Duration: 102 QT Interval:  395 QTC Calculation: 417 R Axis:   28 Text Interpretation:  Sinus rhythm            MDM  No diagnosis found. Patient with a history of recent spinal surgery performed on  06/02/13 presents to the ED with SOB.  Work up shows an elevated troponin.  CT angio chest shows multiple bilateral pulmonary emboli.  Dr. Gwendolyn Grant discussed with Orthopedics who state that the patient can be given Heparin in the setting of a PE.  Heparin ordered.  Patient admitted for additional  management of PE.      Santiago Glad, PA-C 06/06/13 1446

## 2013-06-05 NOTE — ED Notes (Signed)
Per pt's family, pt desat to 60% SOB.

## 2013-06-05 NOTE — Progress Notes (Signed)
Occupational Therapy Treatment Patient Details Name: Ralph [name] MRN: [id number] DOB: 1965/12/15 Today's Date: 06/05/2013 Time: 6578-4696 OT Time Calculation (min): 34 min  OT Assessment / Plan / Recommendation  History of present illness Left-sided L4-5 transforaminal lumbar interbody fusion; Right-sided L4-5 posterolateral fusion; Right-sided L4-5 laminotomy with partial facetectomy and foraminotomy; Displacement of the posterior instrumentation L4, L5, (7 x 45 mm EXPEDIUM screws). Insertion of interbody device x1 (10 x 27 mm interbody spacer). Intraoperative bone marrow aspiration from the patient's right iliac crest using a separate incision; Intraoperative use of fluoroscopy.   OT comments  This 47 yo male with above presents to acute OT making progress and will continue to benefit from [facility] Acute OT will sign off due to pt D/C'ing today.  Follow Up Recommendations  Home health OT       Equipment Recommendations  Tub/shower bench;3 in 1 bedside comode       Frequency Min 2X/week   Progress towards OT Goals Progress towards OT goals: Progressing toward goals  Plan Discharge plan needs to be updated    Precautions / Restrictions Precautions Precautions: Back Required Braces or Orthoses: Spinal Brace Spinal Brace: Thoracolumbosacral orthotic;Applied in supine position Restrictions Weight Bearing Restrictions: No       ADL  ADL Comments: Pt coming out of bathroom with wife as I entered room. I went over AE with pt letting him use the reacher and sock aid as well as the dressing stick (worked better for him then the reacher due to his decreased hand function). Also showed him the long handled sponge and shoe horn.  I also went over the toileting tongs--wife purchased the [place] aide from the gift shop and I switched it out for the toileting tongs.  They are going to defer getting the AE kit at this time, I gave them the  office contact number in case they change their minds  since we will have to change out the reacher for a dressing stick and the long handled shoe horn for the extra long shoe horn for him.      OT Goals(current goals can now be found in the care plan section)    Visit Information  Last OT Received On: 06/05/13 Assistance Needed: +1 History of Present Illness: Left-sided L4-5 transforaminal lumbar interbody fusion; Right-sided L4-5 posterolateral fusion; Right-sided L4-5 laminotomy with partial facetectomy and foraminotomy; Displacement of the posterior instrumentation L4, L5, (7 x 45 mm EXPEDIUM screws). Insertion of interbody device x1 (10 x 27 mm interbody spacer). Intraoperative bone marrow aspiration from the patient's right iliac crest using a separate incision; Intraoperative use of fluoroscopy.          Cognition  Cognition Arousal/Alertness: Awake/alert Behavior During Therapy: WFL for tasks assessed/performed Overall Cognitive Status: Within Functional Limits for tasks assessed    Mobility  Bed Mobility Bed Mobility: Sit to Sidelying Right Sit to Sidelying Right: 3: Mod assist;With rail;HOB elevated (for legs) Transfers Transfers: Stand to Sit Stand to Sit: 4: Min guard;With upper extremity assist;To bed          End of Session OT - End of Session Equipment Utilized During Treatment: Rolling walker;Back brace (AE) Activity Tolerance: Patient tolerated treatment well Patient left: in bed;with family/visitor present Nurse Communication:  (NT: pt ready for W/C to so he can leave)       Evette Georges [phone] 06/05/2013, 2:15 PM

## 2013-06-05 NOTE — Progress Notes (Signed)
Physical Therapy Treatment Patient Details Name: Ralph [name] MRN: [id number] DOB: 1966-02-20 Today's Date: 06/05/2013 Time: 0930-1005 PT Time Calculation (min): 35 min  PT Assessment / Plan / Recommendation  History of Present Illness Left-sided L4-5 transforaminal lumbar interbody fusion; Right-sided L4-5 posterolateral fusion; Right-sided L4-5 laminotomy with partial facetectomy and foraminotomy; Displacement of the posterior instrumentation L4, L5, (7 x 45 mm EXPEDIUM screws). Insertion of interbody device x1 (10 x 27 mm interbody spacer). Intraoperative bone marrow aspiration from the patient's right iliac crest using a separate incision; Intraoperative use of fluoroscopy.   PT Comments   Pt progressing well. Pt and family educated on how to don brace in bed via rolling and supine position. Pt with improved cognition from yesterday. Acute PT to con't to follow.   Follow Up Recommendations  CIR;Supervision/Assistance - 24 hour;Supervision for mobility/OOB     Does the patient have the potential to tolerate intense rehabilitation     Barriers to Discharge        Equipment Recommendations  None recommended by PT    Recommendations for Other Services    Frequency Min 6X/week   Progress towards PT Goals Progress towards PT goals: Progressing toward goals  Plan Current plan remains appropriate    Precautions / Restrictions Precautions Precautions: Back Precaution Comments: pt re-educated on prec Required Braces or Orthoses: Spinal Brace Spinal Brace: Thoracolumbosacral orthotic;Applied in supine position (educated family/patient) Restrictions Weight Bearing Restrictions: No   Pertinent Vitals/Pain 5/10 surgical back pain    Mobility  Bed Mobility Bed Mobility: Rolling Left;Left Sidelying to Sit Rolling Left: 4: Min assist Left Sidelying to Sit: 4: Min assist;With rails Details for Bed Mobility Assistance: pt used bed rail Transfers Transfers: Sit to Stand;Stand to  Sit Sit to Stand: 4: Min assist;With upper extremity assist;From bed Stand to Sit: 4: Min assist;With upper extremity assist;To chair/3-in-1 Details for Transfer Assistance: VCs for safe hand placement Ambulation/Gait Ambulation/Gait Assistance: 4: Min assist Ambulation Distance (Feet): 100 Feet Assistive device: Rolling walker Ambulation/Gait Assistance Details: v/c's to stay in walker and achieve full upright position. Pt with strong tendency to push RW out infront of self. Pt became lightheaded but did not require seated rest break Gait Pattern: Step-through pattern;Decreased stride length;Trunk flexed Gait velocity: slow Stairs: No    Exercises General Exercises - Lower Extremity Ankle Circles/Pumps: AROM;Both;10 reps;Seated Long Arc Quad: AROM;Both;10 reps;Seated   PT Diagnosis:    PT Problem List:   PT Treatment Interventions:     PT Goals (current goals can now be found in the care plan section)    Visit Information  Last PT Received On: 06/05/13 Assistance Needed: +2 History of Present Illness: Left-sided L4-5 transforaminal lumbar interbody fusion; Right-sided L4-5 posterolateral fusion; Right-sided L4-5 laminotomy with partial facetectomy and foraminotomy; Displacement of the posterior instrumentation L4, L5, (7 x 45 mm EXPEDIUM screws). Insertion of interbody device x1 (10 x 27 mm interbody spacer). Intraoperative bone marrow aspiration from the patient's right iliac crest using a separate incision; Intraoperative use of fluoroscopy.    Subjective Data      Cognition  Cognition Arousal/Alertness: Awake/alert Behavior During Therapy: WFL for tasks assessed/performed Overall Cognitive Status: Within Functional Limits for tasks assessed General Comments: pt much improved from yesterday    Balance     End of Session PT - End of Session Equipment Utilized During Treatment: Gait belt;Back brace Activity Tolerance: Patient limited by lethargy Patient left: in  chair;with call bell/phone within reach;with family/visitor present Nurse Communication: Mobility status  GP     Marcene Brawn 06/05/2013, 10:32 AM  Lewis Shock, PT, DPT Pager #: [phone] Office #: [phone]

## 2013-06-05 NOTE — Progress Notes (Signed)
PATIENT ID: Ralph [name]  MRN: [id number]  DOB/AGE:  46-Aug-1967 / 46 y.o.  46 Days Post-Op Procedure(s) (LRB): TRANSFORAMINAL LUMBAR INTERBODY FUSION (TLIF) WITH PEDICLE SCREW FIXATION 1 LEVEL (Left)    PROGRESS NOTE Subjective: Patient is alert, oriented, no current Nausea, no Vomiting, yes passing gas, no Bowel Movement. Taking PO well. Denies SOB, Chest or Calf Pain. Using Incentive Spirometer, PAS in place. Ambulate WBAT with walker and TLSO at all times,  Patient reports pain as mild  .   Pt looks much better this AM.  Wife reports that he has been up several times to go to bathroom.  He is up in bed answering questions.  Objective: Vital signs in last 24 hours: Filed Vitals:   06/04/13 1514 06/04/13 2214 06/05/13 0454 06/05/13 0607  BP: 127/78 119/64    Pulse: 89 77  81  Temp: 97.5 F (36.4 C) 97.5 F (36.4 C)  97.8 F (36.6 C)  TempSrc: Oral Oral  Oral  Resp: 18 18  16   SpO2: 97% 99% 98% 100%      Intake/Output from previous day: I/O last 3 completed shifts: In: 555 [P.O.:555] Out: 1320 [Urine:1200; Drains:120]   Intake/Output this shift:     LABORATORY DATA:  Recent Labs  06/04/13 0840 06/04/13 1056  WBC  --  12.0*  HGB  --  12.1*  HCT  --  37.3*  PLT  --  171  NA  --  136  K  --  4.3  CL  --  100  CO2  --  30  BUN  --  14  CREATININE  --  1.27  GLUCOSE  --  111*  GLUCAP 144*  --   CALCIUM  --  8.2*    Examination: Neurologically intact ABD soft Neurovascular intact Sensation intact distally Intact pulses distally Dorsiflexion/Plantar flexion intact Incision: no drainage}  Assessment:   46 Days Post-Op Procedure(s) (LRB): TRANSFORAMINAL LUMBAR INTERBODY FUSION (TLIF) WITH PEDICLE SCREW FIXATION 1 LEVEL (Left) ADDITIONAL DIAGNOSIS:  Sleep Apnea  Plan: PT/OT WBAT, CPM 5/hrs day until ROM 0-90 degrees, then D/C CPM DVT Prophylaxis:  SCDx72hrs, ASA 325 mg BID x 2 weeks DISCHARGE PLAN: Home DISCHARGE NEEDS: TLSO brace to be worn at all  times, unless short trips to bathroom.  Back precautions  Plan to D/C  Home today when if PT has passed pt goals and continues to improve.      Kalmen Lollar R 06/05/2013, 8:21 AM

## 2013-06-05 NOTE — Progress Notes (Signed)
   CARE MANAGEMENT NOTE 06/05/2013  Patient:  Ralph [name], Ralph [name]   Account Number:  [id number]  Date Initiated:  06/03/2013  Documentation initiated by:  [facility]  Subjective/Objective Assessment:   Left-sided L4-5 transforaminal lumbar interbody fusion     Action/Plan:   CIR recommeded   Anticipated DC Date:     Anticipated DC Plan:  IP REHAB FACILITY      DC Planning Services  CM consult      Choice offered to / List presented to:        DME agency  [facility]        Status of service:  Completed, signed off Medicare Important Message given?   (If response is "NO", the following Medicare IM given date fields will be blank) Date Medicare IM given:   Date Additional Medicare IM given:    Discharge Disposition:  HOME/SELF CARE  Per UR Regulation:    If discussed at Long Length of Stay Meetings, dates discussed:    Comments:  06/05/13 12:50 CM spoke with RN, Pt, and Pt's wife about PT recc CIR.  Pt and wife refuse any HH services except the equipment, 3n1 which was brought to room and shower chair which will be sent to the home.  No other CM needs were communicated.  Freddy Jaksch, BSN, CM [phone].

## 2013-06-05 NOTE — ED Notes (Signed)
Pt with recent spinal surgery, comes in with c/o SOB, and low O2 saturation. Pt sat in triage room 100% on 3 L/min.

## 2013-06-05 NOTE — Discharge Summary (Signed)
Patient ID: Ralph [name] MRN: [id number] DOB/AGE: [birth year] 46 y.o.  Admit date: 06/02/2013 Discharge date: 06/05/2013  Admission Diagnoses:  Active Problems:   * No active hospital problems. *   Discharge Diagnoses:  Same  Past Medical History  Diagnosis Date  . Other quadriplegia and quadriparesis   . Gait disorder   . Gastroesophageal reflux disease   . Obesity   . Lumbago   . Leg fracture, right   . Obstructive sleep apnea on CPAP   . Spinal cord injury, C5-C7   . Hypertension     does not see a cardiologist  . History of MRSA infection     wounds in Fingers and then on leg  . Hypoglycemia   . Complication of anesthesia   . PONV (postoperative nausea and vomiting)     Surgeries: Procedure(s): TRANSFORAMINAL LUMBAR INTERBODY FUSION (TLIF) WITH PEDICLE SCREW FIXATION 1 LEVEL on 06/02/2013   Consultants:    Discharged Condition: Improved  Hospital Course: Ralph [name] is an 47 y.o. male who was admitted 06/02/2013 for operative treatment of<principal problem not specified>. Patient has severe unremitting pain that affects sleep, daily activities, and work/hobbies. After pre-op clearance the patient was taken to the operating room on 06/02/2013 and underwent  Procedure(s): TRANSFORAMINAL LUMBAR INTERBODY FUSION (TLIF) WITH PEDICLE SCREW FIXATION 1 LEVEL.    Patient was given perioperative antibiotics: Anti-infectives   Start     Dose/Rate Route Frequency Ordered Stop   06/02/13 1700  ceFAZolin (ANCEF) IVPB 1 g/50 mL premix     1 g 100 mL/hr over 30 Minutes Intravenous Every 8 hours 06/02/13 1541 06/03/13 0051   06/02/13 0600  ceFAZolin (ANCEF) IVPB 2 g/50 mL premix     2 g 100 mL/hr over 30 Minutes Intravenous On call to O.R. 06/01/13 1533 06/02/13 0730       Patient was given sequential compression devices, early ambulation, and chemoprophylaxis to prevent DVT.  Patient benefited maximally from hospital stay and there were no complications.    Recent  vital signs: Patient Vitals for the past 24 hrs:  BP Temp Temp src Pulse Resp SpO2  06/05/13 0607 - 97.8 F (36.6 C) Oral 81 16 100 %  06/05/13 0454 - - - - - 98 %  06/04/13 2214 119/64 mmHg 97.5 F (36.4 C) Oral 77 18 99 %  06/04/13 1514 127/78 mmHg 97.5 F (36.4 C) Oral 89 18 97 %  06/04/13 1357 - - - 89 - 92 %  06/04/13 1346 - - - 109 - 90 %  06/04/13 1336 - - - 90 - 93 %  06/04/13 0900 102/61 mmHg - - - - 99 %  06/04/13 0832 109/70 mmHg - - - - 98 %     Recent laboratory studies:  Recent Labs  06/04/13 1056  WBC 12.0*  HGB 12.1*  HCT 37.3*  PLT 171  NA 136  K 4.3  CL 100  CO2 30  BUN 14  CREATININE 1.27  GLUCOSE 111*  CALCIUM 8.2*     Discharge Medications:     Medication List    STOP taking these medications       naproxen 500 MG tablet  Commonly known as:  NAPROSYN      TAKE these medications       amitriptyline 75 MG tablet  Commonly known as:  ELAVIL  Take 150 mg by mouth at bedtime.     BACLOFEN IT  by Intrathecal route.     benazepril 20  MG tablet  Commonly known as:  LOTENSIN  Take 20 mg by mouth daily.     diazepam 5 MG tablet  Commonly known as:  VALIUM  Take 5 mg by mouth at bedtime as needed for sleep.     gabapentin 800 MG tablet  Commonly known as:  NEURONTIN  Take 800 mg by mouth 3 (three) times daily.     L-ARGININE PO  Take by mouth.     metoprolol succinate 50 MG 24 hr tablet  Commonly known as:  TOPROL-XL  Take 50 mg by mouth daily. Take with or immediately following a meal.     omeprazole 20 MG capsule  Commonly known as:  PRILOSEC  Take 40 mg by mouth daily.        Diagnostic Studies: Dg Chest 2 View  05/27/2013   CLINICAL DATA:  Pre admit for lumbar surgery  EXAM: CHEST  2 VIEW  COMPARISON:  None.  FINDINGS: The heart size and mediastinal contours are within normal limits. Both lungs are clear. The visualized skeletal structures are unremarkable.  IMPRESSION: No active cardiopulmonary disease.   Electronically  Signed   By: Genevive Bi M.D.   On: 05/27/2013 13:33   Dg Lumbar Spine 2-3 Views  06/02/2013   CLINICAL DATA:  L3-4 fixation.  EXAM: LUMBAR SPINE - 2-3 VIEW; DG C-ARM 1-60 MIN  COMPARISON:  Intraoperative study of earlier in the day. MRI of 11/08/2012.  FINDINGS: AP and lateral views. These demonstrate trans pedicle screw fixation at L4-5. No acute hardware complication.  IMPRESSION: Trans pedicle screw fixation at L4-5. The clinical history describes "L3-4 fixation", but the prior MRI of 11/08/2012 demonstrates extensive abnormalities at L4-5. Correlate with the desired surgical level, which is presumably L4-5.   Electronically Signed   By: Jeronimo Greaves M.D.   On: 06/02/2013 13:47   Dg Lumbar Spine 1 View  06/02/2013   CLINICAL DATA:  L4-L5 fusion  EXAM: LUMBAR SPINE - 1 VIEW  COMPARISON:  Portable cross-table intraoperative lateral view lumbar spine performed and compared to prior prior MRI lumbar spine of 11/08/2012  FINDINGS: Exam #1 at 0744 hr interpreted at 1113 hr.  Prior MRI labeled with 5 lumbar vertebrae, current exam labeled similarly.l  Two metallic probes via posterior approach projects dorsal to the mid L4 and L5 levels.  IMPRESSION: Intraoperative localization view of the lumbar spine as above.   Electronically Signed   By: Ulyses Southward M.D.   On: 06/02/2013 11:14   Dg C-arm 1-60 Min  06/02/2013   CLINICAL DATA:  L3-4 fixation.  EXAM: LUMBAR SPINE - 2-3 VIEW; DG C-ARM 1-60 MIN  COMPARISON:  Intraoperative study of earlier in the day. MRI of 11/08/2012.  FINDINGS: AP and lateral views. These demonstrate trans pedicle screw fixation at L4-5. No acute hardware complication.  IMPRESSION: Trans pedicle screw fixation at L4-5. The clinical history describes "L3-4 fixation", but the prior MRI of 11/08/2012 demonstrates extensive abnormalities at L4-5. Correlate with the desired surgical level, which is presumably L4-5.   Electronically Signed   By: Jeronimo Greaves M.D.   On: 06/02/2013 13:47     Disposition: 01-Home or Self Care      Discharge Orders   Future Appointments Provider Department Dept Phone   06/20/2013 12:00 PM York Spaniel, MD [facility] [phone]   Future Orders Complete By Expires   Call MD / Call 911  As directed    Comments:     If you experience chest  pain or shortness of breath, CALL 911 and be transported to the hospital emergency room.  If you develope a fever above 101 F, pus (white drainage) or increased drainage or redness at the wound, or calf pain, call your surgeon's office.   Constipation Prevention  As directed    Comments:     Drink plenty of fluids.  Prune juice may be helpful.  You may use a stool softener, such as Colace (over the counter) 100 mg twice a day.  Use MiraLax (over the counter) for constipation as needed.   Diet - low sodium heart healthy  As directed    Discharge instructions  As directed    Comments:     Back Pre-cautions, pt to wear TLSO at all times when out of bed except short trips to batrhroom   Driving restrictions  As directed    Comments:     No driving until follow up   Increase activity slowly as tolerated  As directed       Follow-up Information   Follow up with Emilee Hero, MD In 2 weeks.   Specialty:  Orthopedic Surgery   Contact information:   [address] [place] [address] [phone]        Signed: Vear Clock, Brantly Kalman R 06/05/2013, 8:29 AM

## 2013-06-05 NOTE — ED Notes (Signed)
PA at bedside.

## 2013-06-06 ENCOUNTER — Emergency Department (HOSPITAL_COMMUNITY): Payer: Medicare Other

## 2013-06-06 ENCOUNTER — Encounter (HOSPITAL_COMMUNITY): Payer: Self-pay | Admitting: Radiology

## 2013-06-06 DIAGNOSIS — Z9889 Other specified postprocedural states: Secondary | ICD-10-CM

## 2013-06-06 DIAGNOSIS — R7989 Other specified abnormal findings of blood chemistry: Secondary | ICD-10-CM

## 2013-06-06 DIAGNOSIS — I2699 Other pulmonary embolism without acute cor pulmonale: Principal | ICD-10-CM

## 2013-06-06 DIAGNOSIS — Z86718 Personal history of other venous thrombosis and embolism: Secondary | ICD-10-CM | POA: Diagnosis present

## 2013-06-06 DIAGNOSIS — I10 Essential (primary) hypertension: Secondary | ICD-10-CM

## 2013-06-06 DIAGNOSIS — I517 Cardiomegaly: Secondary | ICD-10-CM

## 2013-06-06 HISTORY — PX: TRANSTHORACIC ECHOCARDIOGRAM: SHX275

## 2013-06-06 HISTORY — DX: Other specified postprocedural states: Z98.890

## 2013-06-06 HISTORY — DX: Personal history of other venous thrombosis and embolism: Z86.718

## 2013-06-06 LAB — HEPARIN LEVEL (UNFRACTIONATED)
Heparin Unfractionated: <0.1 IU/mL — ABNORMAL LOW (ref 0.30–0.70)
Heparin Unfractionated: 0.47 IU/mL (ref 0.30–0.70)

## 2013-06-06 LAB — CBC
MCH: 29.8 pg (ref 26.0–34.0)
MCHC: 33.9 g/dL (ref 30.0–36.0)
MCV: 87.8 fL (ref 78.0–100.0)
Platelets: 224 10*3/uL (ref 150–400)
RDW: 13.1 % (ref 11.5–15.5)

## 2013-06-06 LAB — TROPONIN I
Troponin I: <0.3 ng/mL (ref <0.30)
Troponin I: <0.3 ng/mL (ref <0.30)

## 2013-06-06 LAB — CK TOTAL AND CKMB (NOT AT ARMC)
CK, MB: 6.4 ng/mL (ref 0.3–4.0)
Relative Index: 0.9 (ref 0.0–2.5)

## 2013-06-06 LAB — TSH: TSH: 1.21 u[IU]/mL (ref 0.350–4.500)

## 2013-06-06 LAB — PROTIME-INR: Prothrombin Time: 14.3 seconds (ref 11.6–15.2)

## 2013-06-06 MED ORDER — WARFARIN SODIUM 10 MG PO TABS
10.0000 mg | ORAL_TABLET | Freq: Once | ORAL | Status: AC
  Administered 2013-06-06: 10 mg via ORAL
  Filled 2013-06-06: qty 1

## 2013-06-06 MED ORDER — DIAZEPAM 5 MG PO TABS: 5.0000 mg | ORAL_TABLET | Freq: Four times a day (QID) | ORAL | Status: DC | PRN

## 2013-06-06 MED ORDER — WARFARIN - PHARMACIST DOSING INPATIENT
Freq: Every day | Status: DC
  Administered 2013-06-06 – 2013-06-08 (×2)

## 2013-06-06 MED ORDER — GABAPENTIN 400 MG PO CAPS
800.0000 mg | ORAL_CAPSULE | Freq: Three times a day (TID) | ORAL | Status: DC
  Administered 2013-06-06 – 2013-06-07 (×4): 800 mg via ORAL
  Filled 2013-06-06 (×6): qty 2

## 2013-06-06 MED ORDER — HEPARIN BOLUS VIA INFUSION
6000.0000 [IU] | Freq: Once | INTRAVENOUS | Status: AC
  Administered 2013-06-06: 6000 [IU] via INTRAVENOUS
  Filled 2013-06-06: qty 6000

## 2013-06-06 MED ORDER — ONDANSETRON HCL 4 MG/2ML IJ SOLN: 4.0000 mg | Freq: Four times a day (QID) | INTRAMUSCULAR | Status: DC | PRN

## 2013-06-06 MED ORDER — HEPARIN BOLUS VIA INFUSION
3000.0000 [IU] | Freq: Once | INTRAVENOUS | Status: AC
  Administered 2013-06-06: 3000 [IU] via INTRAVENOUS
  Filled 2013-06-06: qty 3000

## 2013-06-06 MED ORDER — ACETAMINOPHEN 500 MG PO TABS
1000.0000 mg | ORAL_TABLET | Freq: Once | ORAL | Status: AC
  Administered 2013-06-06: 1000 mg via ORAL
  Filled 2013-06-06: qty 2

## 2013-06-06 MED ORDER — IOHEXOL 350 MG/ML SOLN
100.0000 mL | Freq: Once | INTRAVENOUS | Status: AC | PRN
  Administered 2013-06-06: 100 mL via INTRAVENOUS

## 2013-06-06 MED ORDER — DOCUSATE SODIUM 100 MG PO CAPS
100.0000 mg | ORAL_CAPSULE | Freq: Two times a day (BID) | ORAL | Status: DC
  Administered 2013-06-06 – 2013-06-08 (×5): 100 mg via ORAL
  Filled 2013-06-06 (×9): qty 1

## 2013-06-06 MED ORDER — HEPARIN (PORCINE) IN NACL 100-0.45 UNIT/ML-% IJ SOLN
1500.0000 [IU]/h | INTRAMUSCULAR | Status: DC
Start: 2013-06-06 — End: 2013-06-06
  Administered 2013-06-06 (×2): 1500 [IU]/h via INTRAVENOUS
  Filled 2013-06-06 (×2): qty 250

## 2013-06-06 MED ORDER — HEPARIN (PORCINE) IN NACL 100-0.45 UNIT/ML-% IJ SOLN
1800.0000 [IU]/h | INTRAMUSCULAR | Status: DC
  Administered 2013-06-07 – 2013-06-08 (×3): 1800 [IU]/h via INTRAVENOUS
  Filled 2013-06-06 (×8): qty 250

## 2013-06-06 MED ORDER — METOPROLOL SUCCINATE ER 50 MG PO TB24
50.0000 mg | ORAL_TABLET | Freq: Every day | ORAL | Status: DC
  Administered 2013-06-06 – 2013-06-07 (×2): 50 mg via ORAL
  Filled 2013-06-06 (×2): qty 1

## 2013-06-06 MED ORDER — HEPARIN SODIUM (PORCINE) 5000 UNIT/ML IJ SOLN: 60.0000 [IU]/kg | Freq: Once | INTRAMUSCULAR | Status: DC

## 2013-06-06 MED ORDER — SODIUM CHLORIDE 0.9 % IJ SOLN
3.0000 mL | Freq: Two times a day (BID) | INTRAMUSCULAR | Status: DC
  Administered 2013-06-07: 3 mL via INTRAVENOUS

## 2013-06-06 MED ORDER — COUMADIN BOOK
Freq: Once | Status: AC
  Administered 2013-06-06: 18:00:00
  Filled 2013-06-06: qty 1

## 2013-06-06 MED ORDER — ACETAMINOPHEN 325 MG PO TABS
650.0000 mg | ORAL_TABLET | Freq: Four times a day (QID) | ORAL | Status: DC | PRN
  Administered 2013-06-06 – 2013-06-08 (×6): 650 mg via ORAL
  Filled 2013-06-06 (×6): qty 2

## 2013-06-06 MED ORDER — PANTOPRAZOLE SODIUM 40 MG PO TBEC
40.0000 mg | DELAYED_RELEASE_TABLET | Freq: Every day | ORAL | Status: DC
  Administered 2013-06-06 – 2013-06-09 (×4): 40 mg via ORAL
  Filled 2013-06-06 (×4): qty 1

## 2013-06-06 MED ORDER — ONDANSETRON HCL 4 MG PO TABS: 4.0000 mg | ORAL_TABLET | Freq: Four times a day (QID) | ORAL | Status: DC | PRN

## 2013-06-06 MED ORDER — IOHEXOL 300 MG/ML  SOLN: 100.0000 mL | Freq: Once | INTRAMUSCULAR | Status: DC | PRN

## 2013-06-06 MED ORDER — WARFARIN VIDEO
Freq: Once | Status: AC
  Administered 2013-06-09: 07:00:00

## 2013-06-06 MED ORDER — GABAPENTIN 800 MG PO TABS
800.0000 mg | ORAL_TABLET | Freq: Three times a day (TID) | ORAL | Status: DC
  Filled 2013-06-06 (×3): qty 1

## 2013-06-06 MED ORDER — AMITRIPTYLINE HCL 75 MG PO TABS
150.0000 mg | ORAL_TABLET | Freq: Every day | ORAL | Status: DC
  Administered 2013-06-06: 150 mg via ORAL
  Filled 2013-06-06 (×2): qty 2

## 2013-06-06 MED ORDER — HYDROMORPHONE HCL PF 1 MG/ML IJ SOLN: 1.0000 mg | INTRAMUSCULAR | Status: DC | PRN

## 2013-06-06 MED ORDER — BENAZEPRIL HCL 20 MG PO TABS
20.0000 mg | ORAL_TABLET | Freq: Every day | ORAL | Status: DC
  Administered 2013-06-06 – 2013-06-07 (×2): 20 mg via ORAL
  Filled 2013-06-06 (×2): qty 1

## 2013-06-06 NOTE — Progress Notes (Signed)
ANTICOAGULATION CONSULT NOTE - Follow Up Consult  Pharmacy Consult for Heparin Indication: pulmonary embolus  No Known Allergies  Patient Measurements: Height: 6' (182.9 cm) Weight: 236 lb 14.4 oz (107.457 kg) IBW/kg (Calculated) : 77.6 Heparin Dosing Weight: 99kg  Vital Signs: Temp: 99.3 F (37.4 C) (11/03 2053) Temp src: Oral (11/03 2053) BP: 153/81 mmHg (11/03 2053) Pulse Rate: 87 (11/03 2053)  Labs:  Recent Labs  06/04/13 1056 06/05/13 2041 06/06/13 0045 06/06/13 1220 06/06/13 1230 06/06/13 1830 06/06/13 2105  HGB 12.1* 11.8*  --  12.2*  --   --   --   HCT 37.3* 34.2*  --  36.0*  --   --   --   PLT 171 228  --  224  --   --   --   LABPROT  --   --   --  14.3  --   --   --   INR  --   --   --  1.13  --   --   --   HEPARINUNFRC  --   --   --  <0.10*  --   --  0.47  CREATININE 1.27 0.73  --   --   --   --   --   CKTOTAL  --   --  743*  --   --   --   --   CKMB  --   --  6.4*  --   --   --   --   TROPONINI  --   --  0.33*  --  <0.30 <0.30  --     Estimated Creatinine Clearance: 146.2 ml/min (by C-G formula based on Cr of 0.73).   Medications:  Heparin @ 1800 units/hr  Assessment: 47yom started on heparin for new multiple bilateral PEs (11/3). LE dopplers negative for DVT. HL now therapeutic at 0.47. Also began coumadin today. Baseline INR 1.13. No bleeding was noted.   He will need at least 5 days of overlap therapy with heparin and coumadin.  Goal of Therapy:  Heparin level 0.3-0.7 Monitor platelets by anticoagulation protocol: Yes   Plan:  1) Continue heparin infusion at 1800 units/hr 2) F/u daily heparin levels. 3) Monitor for signs of bleeding   Vinnie Level, PharmD.  [place] License #[id number] Application for [place] reciprocity pending  Clinical Pharmacist Pager [phone]

## 2013-06-06 NOTE — Progress Notes (Signed)
VASCULAR LAB PRELIMINARY  PRELIMINARY  PRELIMINARY  PRELIMINARY  Bilateral lower extremity venous Dopplers completed.    Preliminary report:  There is acute occlusive DVT noted in the right posterior tibial vein in the proximal calf.  All other veins appear thrombus free.  Kelechi Orgeron, RVT 06/06/2013, 10:21 AM

## 2013-06-06 NOTE — Progress Notes (Signed)
ANTICOAGULATION CONSULT NOTE - Follow Up Consult  Pharmacy Consult for Heparin + Coumadin Indication: pulmonary embolus  No Known Allergies  Patient Measurements: Height: 6' (182.9 cm) Weight: 236 lb 14.4 oz (107.457 kg) IBW/kg (Calculated) : 77.6 Heparin Dosing Weight: 99kg  Vital Signs: Temp: 98.4 F (36.9 C) (11/03 0858) Temp src: Oral (11/03 0858) BP: 175/98 mmHg (11/03 1145) Pulse Rate: 92 (11/03 1145)  Labs:  Recent Labs  06/04/13 1056 06/05/13 2041 06/06/13 0045 06/06/13 1220 06/06/13 1230  HGB 12.1* 11.8*  --  12.2*  --   HCT 37.3* 34.2*  --  36.0*  --   PLT 171 228  --  224  --   LABPROT  --   --   --  14.3  --   INR  --   --   --  1.13  --   HEPARINUNFRC  --   --   --  <0.10*  --   CREATININE 1.27 0.73  --   --   --   CKTOTAL  --   --  743*  --   --   CKMB  --   --  6.4*  --   --   TROPONINI  --   --  0.33*  --  <0.30    Estimated Creatinine Clearance: 146.2 ml/min (by C-G formula based on Cr of 0.73).   Medications:  Heparin @ 1500 units/hr  Assessment: 47yom started on heparin for new multiple bilateral PEs (11/3). LE dopplers negative for DVT. Initial heparin level is undetectable. Level was drawn late due to patient being a hard stick (took 3 different tries per lab tech), otherwise no issues noted. Also to begin coumadin today. Baseline INR 1.13, coumadin score = 9.   He will need at least 5 days of overlap therapy with heparin and coumadin.  Goal of Therapy:  INR 2-3 Heparin level 0.3-0.7 Monitor platelets by anticoagulation protocol: Yes   Plan:  1) Re-bolus heparin 3000 units x 1 2) Increase drip to 1800 units/hr 3) check 6 hour heparin level 4) Coumadin 10mg  x 1 5) Daily INR 6) Coumadin education - book/video  Fredrik Rigger 06/06/2013,1:42 PM

## 2013-06-06 NOTE — Progress Notes (Signed)
I agree with above note.   Christoper Fabian, PharmD, BCPS Clinical pharmacist, pager [phone] 06/06/2013  9:53 PM

## 2013-06-06 NOTE — ED Notes (Signed)
Pt tolerating room air well. 02 sats maintained at 100% for the past 30 minutes.

## 2013-06-06 NOTE — ED Notes (Signed)
Admitting MD at bedside.

## 2013-06-06 NOTE — Progress Notes (Signed)
ANTICOAGULATION CONSULT NOTE - Initial Consult  Pharmacy Consult for Heparin  Indication: pulmonary embolus  No Known Allergies  Patient Measurements:   Heparin Dosing Weight: 99 kg  Vital Signs: Temp: 98.2 F (36.8 C) (11/02 1957) Temp src: Oral (11/02 1957) BP: 141/75 mmHg (11/02 2300) Pulse Rate: 60 (11/03 0000)  Labs:  Recent Labs  06/04/13 1056 06/05/13 2041  HGB 12.1* 11.8*  HCT 37.3* 34.2*  PLT 171 228  CREATININE 1.27 0.73    The CrCl is unknown because both a height and weight (above a minimum accepted value) are required for this calculation.   Medical History: Past Medical History  Diagnosis Date  . Other quadriplegia and quadriparesis   . Gait disorder   . Gastroesophageal reflux disease   . Obesity   . Lumbago   . Leg fracture, right   . Obstructive sleep apnea on CPAP   . Spinal cord injury, C5-C7   . Hypertension     does not see a cardiologist  . History of MRSA infection     wounds in Fingers and then on leg  . Hypoglycemia   . Complication of anesthesia   . PONV (postoperative nausea and vomiting)     Assessment: 47 y/o to start heparin drip for PE diagnosed via CT Angio. Hgb 11.8, Plts 228, Scr 0.73, no overt bleeding noted, will order baseline INR with first HL, no reason to believe it will be abnormal.   Goal of Therapy:  Heparin level 0.3-0.7 units/ml Monitor platelets by anticoagulation protocol: Yes   Plan:  -Heparin 6000 units BOLUS x 1 -Start heparin drip at 1500 units/hr -6 hour HL at 0800 -Daily CBC/HL -Monitor for bleeding  Thank you for allowing me to take part in this patient's care,  Ralph [name], PharmD Clinical Pharmacist Phone: [phone] Pager: [phone] 06/06/2013 1:22 AM

## 2013-06-06 NOTE — Care Management Note (Unsigned)
    Page 1 of 1   06/06/2013     3:04:07 PM   CARE MANAGEMENT NOTE 06/06/2013  Patient:  Ralph [name], Ralph [name]   Account Number:  [id number]  Date Initiated:  06/06/2013  Documentation initiated by:  Erleen Egner  Subjective/Objective Assessment:   PT ADM ON 06/06/13 WITH ACUTE PE/DVT S/P SPINAL SURGERY. PTA, PT INDEPENDENT, LIVES WITH SPOUSE AND FAMILY.     Action/Plan:   WILL FOLLOW FOR DISCHARGE NEEDS AS PT PROGRESSES.   Anticipated DC Date:  06/09/2013   Anticipated DC Plan:  HOME/SELF CARE      DC Planning Services  CM consult      Choice offered to / List presented to:             Status of service:  In process, will continue to follow Medicare Important Message given?   (If response is "NO", the following Medicare IM given date fields will be blank) Date Medicare IM given:   Date Additional Medicare IM given:    Discharge Disposition:    Per UR Regulation:  Reviewed for med. necessity/level of care/duration of stay  If discussed at Long Length of Stay Meetings, dates discussed:    Comments:

## 2013-06-06 NOTE — Progress Notes (Signed)
  Echocardiogram 2D Echocardiogram has been performed.  Cathie Beams 06/06/2013, 10:31 AM

## 2013-06-06 NOTE — ED Provider Notes (Signed)
Medical screening examination/treatment/procedure(s) were conducted as a shared visit with non-physician practitioner(s) and myself.  I personally evaluated the patient during the encounter.  EKG Interpretation     Ventricular Rate:  67 PR Interval:  194 QRS Duration: 102 QT Interval:  395 QTC Calculation: 417 R Axis:   28 Text Interpretation:  Sinus rhythm              I saw this patient with Ralph [name]. Patient with SOB after recent back surgery. Patient with hypoxia at home per fingertip sat monitor provided by son. Patient's troponin elevated. I spoke with Ortho who state he could have heparin in the event of NSTEMI or PE. CT scan shows multiple bilateral PEs. Admitted to medicine.   Dagmar Hait, MD 06/06/13 [phone]

## 2013-06-06 NOTE — ED Notes (Signed)
Pt placed back on 3L 02, pt resting, pt's 02 sats dropped to 91% on RA. PA informed.

## 2013-06-06 NOTE — Progress Notes (Signed)
[facility] PROGRESS NOTE  Ralph [name] HQI:[id number] DOB: 1966/04/09 DOA: 06/05/2013 PCP: Marylen Ponto, MD  Assessment/Plan:  47 y.o. male with hx of lumbar spinal surgery 4 days ago, hx of sleep apnea on CPAP, HTN, baclofen pump, who was at home and checked by a friend with a pulse oximetry and found that he was hypoxic, brought to the ER for evaluation. Patient himself denied feeling short of breath or any chest pain. Subsequent evaluation in the ER including a positive D dimer at 2.17, culminating into a CTA which showed multiple bilateral pulmonary emboli, most are non-occlusive, except in the RLL which appears to be occlusive  1. BL PE, hemodynamically stable; episode of hypoxia resolved ; CTA: multiple bilateral pulmonary emboli -cont IV heparin, bridging with heparin; d/w patient at lenth risk of bleeding/complication with anticoagulation, and with recent surgery -obtain LE [place] r/o DVT, echo;  -d/w dr. Jeffie Pollock [facility] surgery   2. positive troponin likely related to PE; ECG no acute changes; no active cardiopulmonary symptoms;  -CT: cardiomegaly;  -obtain echo; serial trop   3. Chronic back pain s/p lumbar fusion on 10/30; d/w Dr. Jeffie Pollock [facility];  -cont monitoring on anticoagulation, pain control;   4. HTN cont BB; monitor   Code Status: full Family Communication: wife, family at the bedside (indicate person spoken with, relationship, and if by phone, the number) Disposition Plan: home wnen ready    Consultants:  Phone c/s with spine surgeon   Procedures:  CTA  Antibiotics:  None  (indicate start date, and stop date if known)  HPI/Subjective: Alert, no distress   Objective: Filed Vitals:   06/06/13 0858  BP: 142/90  Pulse:   Temp:   Resp: 16    Intake/Output Summary (Last 24 hours) at 06/06/13 0910 Last data filed at 06/06/13 0900  Gross per 24 hour  Intake    240 ml  Output    250 ml  Net    -10 ml   Filed Weights   06/06/13  0355  Weight: 107.457 kg (236 lb 14.4 oz)    Exam:   General:  alert  Cardiovascular: s1,s2 rrr  Respiratory: CTA BL   Abdomen: soft, nt, nd   Musculoskeletal: NO LE edema   Data Reviewed: Basic Metabolic Panel:  Recent Labs Lab 06/04/13 1056 06/05/13 2041  NA 136 136  K 4.3 3.7  CL 100 101  CO2 30 31  GLUCOSE 111* 110*  BUN 14 10  CREATININE 1.27 0.73  CALCIUM 8.2* 8.6   Liver Function Tests: No results found for this basename: AST, ALT, ALKPHOS, BILITOT, PROT, ALBUMIN,  in the last 168 hours No results found for this basename: LIPASE, AMYLASE,  in the last 168 hours No results found for this basename: AMMONIA,  in the last 168 hours CBC:  Recent Labs Lab 06/04/13 1056 06/05/13 2041  WBC 12.0* 7.2  NEUTROABS 9.9*  --   HGB 12.1* 11.8*  HCT 37.3* 34.2*  MCV 91.9 86.8  PLT 171 228   Cardiac Enzymes:  Recent Labs Lab 06/06/13 0045  CKTOTAL 743*  CKMB 6.4*  TROPONINI 0.33*   BNP (last 3 results) No results found for this basename: PROBNP,  in the last 8760 hours CBG:  Recent Labs Lab 06/04/13 0840  GLUCAP 144*    Recent Results (from the past 240 hour(s))  SURGICAL PCR SCREEN     Status: Abnormal   Collection Time    05/27/13 12:58 PM  Result Value Range Status   MRSA, PCR POSITIVE (*) NEGATIVE Final   Staphylococcus aureus POSITIVE (*) NEGATIVE Final   Comment:            The Xpert SA Assay (FDA     approved for NASAL specimens     in patients over 75 years of age),     is one component of     a comprehensive surveillance     program.  Test performance has     been validated by [employer] for patients greater     than or equal to 27 year old.     It is not intended     to diagnose infection nor to     guide or monitor treatment.     Studies: Dg Chest 2 View  06/05/2013   CLINICAL DATA:  Shortness of breath.  EXAM: CHEST  2 VIEW  COMPARISON:  CHEST x-ray 05/27/2013.  FINDINGS: Lung volumes are normal. No consolidative  airspace disease. No pleural effusions. No pneumothorax. No pulmonary nodule or mass noted. Pulmonary vasculature and the cardiomediastinal silhouette are within normal limits.  IMPRESSION: 1.  No radiographic evidence of acute cardiopulmonary disease.   Electronically Signed   By: Trudie Reed M.D.   On: 06/05/2013 22:28   Ct Angio Chest Pe W/cm &/or Wo Cm  06/06/2013   CLINICAL DATA:  Shortness of breath.  EXAM: CT ANGIOGRAPHY CHEST WITH CONTRAST  TECHNIQUE: Multidetector CT imaging of the chest was performed using the standard protocol during bolus administration of intravenous contrast. Multiplanar CT image reconstructions including MIPs were obtained to evaluate the vascular anatomy.  CONTRAST:  OMNIPAQUE IOHEXOL 350 MG/ML SOLN  COMPARISON:  No priors.  FINDINGS: Mediastinum: Multiple filling defects are noted in the pulmonary arterial tree bilaterally, compatible with acute pulmonary emboli. These include segmental and subsegmental sized filling defects predominantly in the lower lobes of the lungs bilaterally. The majority of these appear nonocclusive, however, there does appear to be a completely occlusive embolus in the right lower lobe best demonstrated on image 52 of series 4. No saddle emboli identified. Mild cardiomegaly. Small amount of pericardial fluid and/or thickening, unlikely to be of hemodynamic significance at this time. No pathologically enlarged mediastinal or hilar lymph nodes. Esophagus is unremarkable in appearance.  Lungs/Pleura: No consolidative airspace disease to suggest hemorrhage from pulmonary infarction at this time. Linear opacities in the left lower lobe compatible with subsegmental atelectasis or scarring. Trace bilateral pleural effusions layering dependently. No definite suspicious appearing pulmonary nodules or masses are noted.  Upper Abdomen: Unremarkable.  Musculoskeletal: There are no aggressive appearing lytic or blastic lesions noted in the visualized  portions of the skeleton.  Review of the MIP images confirms the above findings.  IMPRESSION: 1. Study is positive for multiple bilateral pulmonary emboli predominantly involving segmental and subsegmental sized branches to the lower lobes of the lungs. 2. Trace bilateral pleural effusions layering dependently. 3. Cardiomegaly with trace amount of pericardial fluid and/or thickening, unlikely to be of hemodynamic significance at this time.   Electronically Signed   By: Trudie Reed M.D.   On: 06/06/2013 00:59    Scheduled Meds: . amitriptyline  150 mg Oral QHS  . benazepril  20 mg Oral Daily  . docusate sodium  100 mg Oral BID  . gabapentin  800 mg Oral TID  . metoprolol succinate  50 mg Oral Daily  . pantoprazole  40 mg Oral Daily  . sodium  chloride  3 mL Intravenous Q12H   Continuous Infusions: . heparin 1,500 Units/hr (06/06/13 0138)    Principal Problem:   Acute pulmonary embolism Active Problems:   Other quadriplegia and quadriparesis   Status post lumbar surgery    Time spent: >35 minutes     Esperanza Sheets  [facility] Pager [phone]. If 7PM-7AM, please contact night-coverage at [email or web address], password [facility] 06/06/2013, 9:10 AM  LOS: 1 day

## 2013-06-06 NOTE — H&P (Signed)
[facility] History and Physical  Ralph [name] ZOX:[id number] DOB: [birth year]    PCP:   Marylen Ponto, MD   Chief Complaint: No complaint.  HPI: Ralph [name] is an 47 y.o. male with hx of lumbar spinal surgery 4 days ago, hx of sleep apnea on CPAP, HTN, baclofen pump, who was at home and checked by a friend with a pulse oximetry and found that he was hypoxic, brought to the ER for evaluation.   Patient himself denied feeling short of breath or any chest pain.  Subsequent evaluation in the ER including a positive D dimer at 2.17, culminating into a CTPA which showed multiple bilateral pulmonary emboli, most are non-occlusive, except in the RLL which appears to be occlusive.  His troponin was borderline elevated at 0.33.  After consultation with regard to anticoagulation with orthopedics covering for Dr Veda Canning by the EDP physician, hospitalist was asked to admit him for provoked PE.  He remained hemodynamically stable.  Rewiew of Systems:  Constitutional: Negative for malaise, fever and chills. No significant weight loss or weight gain Eyes: Negative for eye pain, redness and discharge, diplopia, visual changes, or flashes of light. ENMT: Negative for ear pain, hoarseness, nasal congestion, sinus pressure and sore throat. No headaches; tinnitus, drooling, or problem swallowing. Cardiovascular: Negative for chest pain, palpitations, diaphoresis, dyspnea and peripheral edema. ; No orthopnea, PND Respiratory: Negative for cough, hemoptysis, wheezing and stridor. No pleuritic chestpain. Gastrointestinal: Negative for nausea, vomiting, diarrhea, constipation, abdominal pain, melena, blood in stool, hematemesis, jaundice and rectal bleeding.    Genitourinary: Negative for frequency, dysuria, incontinence,flank pain and hematuria; Musculoskeletal: Negative for back pain and neck pain. Negative for swelling and trauma.;  Skin: . Negative for pruritus, rash, abrasions, bruising and skin  lesion.; ulcerations Neuro: Negative for headache, lightheadedness and neck stiffness. Negative for weakness, altered level of consciousness , altered mental status, extremity weakness, burning feet, involuntary movement, seizure and syncope.  Psych: negative for anxiety, depression, insomnia, tearfulness, panic attacks, hallucinations, paranoia, suicidal or homicidal ideation    Past Medical History  Diagnosis Date  . Other quadriplegia and quadriparesis   . Gait disorder   . Gastroesophageal reflux disease   . Obesity   . Lumbago   . Leg fracture, right   . Obstructive sleep apnea on CPAP   . Spinal cord injury, C5-C7   . Hypertension     does not see a cardiologist  . History of MRSA infection     wounds in Fingers and then on leg  . Hypoglycemia   . Complication of anesthesia   . PONV (postoperative nausea and vomiting)     Past Surgical History  Procedure Laterality Date  . Right anterior cruciate ligament repair    . Baclofen pump placement    . Lumbar fusion  06/02/2013    L4  L5  Dr Yevette Edwards  . Transforaminal lumbar interbody fusion (tlif) with pedicle screw fixation 1 level Left 06/02/2013    Procedure: TRANSFORAMINAL LUMBAR INTERBODY FUSION (TLIF) WITH PEDICLE SCREW FIXATION 1 LEVEL;  Surgeon: Emilee Hero, MD;  Location: [facility] OR;  Service: Orthopedics;  Laterality: Left;  Left sided lumbar 4-5 transforaminal lumbar interbody fusion with instrumentation, vitoss, bone marrow aspirate.    Medications:  HOME MEDS: Prior to Admission medications   Medication Sig Start Date End Date Taking? Authorizing Provider  amitriptyline (ELAVIL) 75 MG tablet Take 150 mg by mouth at bedtime.   Yes Historical Provider, MD  benazepril (LOTENSIN) 20 MG tablet  Take 20 mg by mouth daily.   Yes Historical Provider, MD  diazepam (VALIUM) 5 MG tablet Take 5 mg by mouth at bedtime as needed for sleep.   Yes Historical Provider, MD  gabapentin (NEURONTIN) 800 MG tablet Take 800 mg by  mouth 3 (three) times daily.   Yes Historical Provider, MD  L-ARGININE PO Take by mouth.   Yes Historical Provider, MD  metoprolol succinate (TOPROL-XL) 50 MG 24 hr tablet Take 50 mg by mouth daily. Take with or immediately following a meal.   Yes Historical Provider, MD  omeprazole (PRILOSEC) 20 MG capsule Take 40 mg by mouth daily.   Yes Historical Provider, MD  BACLOFEN IT by Intrathecal route.    Historical Provider, MD     Allergies:  No Known Allergies  Social History:   reports that he has never smoked. His smokeless tobacco use includes Chew. He reports that he does not drink alcohol or use illicit drugs.  Family History: History reviewed. No pertinent family history.   Physical Exam: Filed Vitals:   06/05/13 2230 06/05/13 2300 06/06/13 0000 06/06/13 0115  BP: 163/98 141/75  144/86  Pulse: 64 77 60 59  Temp:      TempSrc:      Resp: 13 13 10 14   SpO2: 100% 100% 100% 100%   Blood pressure 144/86, pulse 59, temperature 98.2 F (36.8 C), temperature source Oral, resp. rate 14, SpO2 100.00%.  GEN:  Pleasant  patient lying in the stretcher in no acute distress; cooperative with exam. PSYCH:  alert and oriented x4; does not appear anxious or depressed; affect is appropriate. HEENT: Mucous membranes pink and anicteric; PERRLA; EOM intact; no cervical lymphadenopathy nor thyromegaly or carotid bruit; no JVD; There were no stridor. Neck is very supple. Breasts:: Not examined CHEST WALL: No tenderness CHEST: Normal respiration, clear to auscultation bilaterally.  HEART: Regular rate and rhythm.  There are no murmur, rub, or gallops.   BACK: No kyphosis or scoliosis; no CVA tenderness ABDOMEN: soft and non-tender; no masses, no organomegaly, normal abdominal bowel sounds; no pannus; no intertriginous candida. There is no rebound and no distention. Rectal Exam: Not done EXTREMITIES: No bone or joint deformity; age-appropriate arthropathy of the hands and knees; no edema; no  ulcerations.  There is no calf tenderness. Genitalia: not examined PULSES: 2+ and symmetric SKIN: Normal hydration no rash or ulceration CNS: Cranial nerves 2-12 grossly intact no focal lateralizing neurologic deficit.  Speech is fluent; uvula elevated with phonation, facial symmetry and tongue midline. DTR are normal bilaterally, cerebella exam is intact, barbinski is negative and strengths are equaled bilaterally.  No sensory loss.   Labs on Admission:  Basic Metabolic Panel:  Recent Labs Lab 06/04/13 1056 06/05/13 2041  NA 136 136  K 4.3 3.7  CL 100 101  CO2 30 31  GLUCOSE 111* 110*  BUN 14 10  CREATININE 1.27 0.73  CALCIUM 8.2* 8.6   Liver Function Tests: No results found for this basename: AST, ALT, ALKPHOS, BILITOT, PROT, ALBUMIN,  in the last 168 hours No results found for this basename: LIPASE, AMYLASE,  in the last 168 hours No results found for this basename: AMMONIA,  in the last 168 hours CBC:  Recent Labs Lab 06/04/13 1056 06/05/13 2041  WBC 12.0* 7.2  NEUTROABS 9.9*  --   HGB 12.1* 11.8*  HCT 37.3* 34.2*  MCV 91.9 86.8  PLT 171 228   Cardiac Enzymes:  Recent Labs Lab 06/06/13 0045  CKTOTAL 743*  CKMB 6.4*  TROPONINI 0.33*    CBG:  Recent Labs Lab 06/04/13 0840  GLUCAP 144*     Radiological Exams on Admission: Dg Chest 2 View  06/05/2013   CLINICAL DATA:  Shortness of breath.  EXAM: CHEST  2 VIEW  COMPARISON:  CHEST x-ray 05/27/2013.  FINDINGS: Lung volumes are normal. No consolidative airspace disease. No pleural effusions. No pneumothorax. No pulmonary nodule or mass noted. Pulmonary vasculature and the cardiomediastinal silhouette are within normal limits.  IMPRESSION: 1.  No radiographic evidence of acute cardiopulmonary disease.   Electronically Signed   By: Trudie Reed M.D.   On: 06/05/2013 22:28   Ct Angio Chest Pe W/cm &/or Wo Cm  06/06/2013   CLINICAL DATA:  Shortness of breath.  EXAM: CT ANGIOGRAPHY CHEST WITH CONTRAST   TECHNIQUE: Multidetector CT imaging of the chest was performed using the standard protocol during bolus administration of intravenous contrast. Multiplanar CT image reconstructions including MIPs were obtained to evaluate the vascular anatomy.  CONTRAST:  OMNIPAQUE IOHEXOL 350 MG/ML SOLN  COMPARISON:  No priors.  FINDINGS: Mediastinum: Multiple filling defects are noted in the pulmonary arterial tree bilaterally, compatible with acute pulmonary emboli. These include segmental and subsegmental sized filling defects predominantly in the lower lobes of the lungs bilaterally. The majority of these appear nonocclusive, however, there does appear to be a completely occlusive embolus in the right lower lobe best demonstrated on image 52 of series 4. No saddle emboli identified. Mild cardiomegaly. Small amount of pericardial fluid and/or thickening, unlikely to be of hemodynamic significance at this time. No pathologically enlarged mediastinal or hilar lymph nodes. Esophagus is unremarkable in appearance.  Lungs/Pleura: No consolidative airspace disease to suggest hemorrhage from pulmonary infarction at this time. Linear opacities in the left lower lobe compatible with subsegmental atelectasis or scarring. Trace bilateral pleural effusions layering dependently. No definite suspicious appearing pulmonary nodules or masses are noted.  Upper Abdomen: Unremarkable.  Musculoskeletal: There are no aggressive appearing lytic or blastic lesions noted in the visualized portions of the skeleton.  Review of the MIP images confirms the above findings.  IMPRESSION: 1. Study is positive for multiple bilateral pulmonary emboli predominantly involving segmental and subsegmental sized branches to the lower lobes of the lungs. 2. Trace bilateral pleural effusions layering dependently. 3. Cardiomegaly with trace amount of pericardial fluid and/or thickening, unlikely to be of hemodynamic significance at this time.   Electronically  Signed   By: Trudie Reed M.D.   On: 06/06/2013 00:59    Assessment/Plan Present on Admission:  . Acute pulmonary embolism . Other quadriplegia and quadriparesis Recent lumbar spinal surgery.  PLAN:  Will admit him for full anticoagulation.  He remained stable hemodynamically.  Will continue his IV Heparin as it is felt to be ok with his recent spinal surgery.  Risk of spinal bleeding was discussed as well.   I have not started him on oral anticoagulant, but will defer to the day team.  His borderline elevated troponin is likely from the PE itself.  Post op, he did have period of unresponsiveness with narcotics, so we should be careful with dosing.  He is stable, full code, and will be admitted to [facility] service.    Other plans as per orders.  Code Status: FULL Unk Lightning, MD. [facility] Pager [phone] 7pm to 7am.  06/06/2013, 3:33 AM

## 2013-06-07 LAB — PROTIME-INR
INR: 1.17 (ref 0.00–1.49)
Prothrombin Time: 14.7 seconds (ref 11.6–15.2)

## 2013-06-07 LAB — TROPONIN I: Troponin I: <0.3 ng/mL (ref <0.30)

## 2013-06-07 LAB — CBC
MCHC: 34.4 g/dL (ref 30.0–36.0)
Platelets: 246 10*3/uL (ref 150–400)
RDW: 13.2 % (ref 11.5–15.5)
WBC: 6.8 10*3/uL (ref 4.0–10.5)

## 2013-06-07 LAB — HEPARIN LEVEL (UNFRACTIONATED): Heparin Unfractionated: 0.47 IU/mL (ref 0.30–0.70)

## 2013-06-07 MED ORDER — AMITRIPTYLINE HCL 75 MG PO TABS
150.0000 mg | ORAL_TABLET | ORAL | Status: DC
  Administered 2013-06-07 – 2013-06-08 (×2): 150 mg via ORAL
  Filled 2013-06-07 (×3): qty 2

## 2013-06-07 MED ORDER — WARFARIN SODIUM 10 MG PO TABS
10.0000 mg | ORAL_TABLET | Freq: Once | ORAL | Status: AC
  Administered 2013-06-07: 10 mg via ORAL
  Filled 2013-06-07: qty 1

## 2013-06-07 MED ORDER — GABAPENTIN 400 MG PO CAPS
800.0000 mg | ORAL_CAPSULE | Freq: Three times a day (TID) | ORAL | Status: DC
  Administered 2013-06-07 – 2013-06-09 (×6): 800 mg via ORAL
  Filled 2013-06-07 (×8): qty 2

## 2013-06-07 MED ORDER — BENAZEPRIL HCL 20 MG PO TABS
20.0000 mg | ORAL_TABLET | ORAL | Status: DC
  Administered 2013-06-08 – 2013-06-09 (×2): 20 mg via ORAL
  Filled 2013-06-07 (×3): qty 1

## 2013-06-07 MED ORDER — METOPROLOL SUCCINATE ER 50 MG PO TB24
50.0000 mg | ORAL_TABLET | ORAL | Status: DC
  Administered 2013-06-08 – 2013-06-09 (×2): 50 mg via ORAL
  Filled 2013-06-07 (×3): qty 1

## 2013-06-07 NOTE — Progress Notes (Signed)
ANTICOAGULATION CONSULT NOTE - Follow Up Consult  Pharmacy Consult for Heparin + Coumadin Indication: pulmonary embolus  No Known Allergies  Patient Measurements: Height: 6' (182.9 cm) Weight: 236 lb 14.4 oz (107.457 kg) IBW/kg (Calculated) : 77.6 Heparin Dosing Weight: 99kg  Vital Signs: Temp: 99.1 F (37.3 C) (11/04 0607) Temp src: Oral (11/04 0436) BP: 152/90 mmHg (11/04 1048) Pulse Rate: 70 (11/04 0436)  Labs:  Recent Labs  06/05/13 2041  06/06/13 0045 06/06/13 1220 06/06/13 1230 06/06/13 1830 06/06/13 2105 06/07/13 0100 06/07/13 0956  HGB 11.8*  --   --  12.2*  --   --   --  11.5*  --   HCT 34.2*  --   --  36.0*  --   --   --  33.4*  --   PLT 228  --   --  224  --   --   --  246  --   LABPROT  --   --   --  14.3  --   --   --  14.7  --   INR  --   --   --  1.13  --   --   --  1.17  --   HEPARINUNFRC  --   --   --  <0.10*  --   --  0.47  --  0.47  CREATININE 0.73  --   --   --   --   --   --   --   --   CKTOTAL  --   --  743*  --   --   --   --   --   --   CKMB  --   --  6.4*  --   --   --   --   --   --   TROPONINI  --   < > 0.33*  --  <0.30 <0.30  --  <0.30  --   < > = values in this interval not displayed.  Estimated Creatinine Clearance: 146.2 ml/min (by C-G formula based on Cr of 0.73).   Medications:  Heparin @ 1500 units/hr  Assessment: 47yom on heparin bridge to coumadin for new multiple bilateral PEs (11/3). LE dopplers negative for DVT. Pt is on Day #2 of 5 day minimum overlap. INR with no real movement as expected after coumadin started yesterday. Heparin level remains therapeutic (0.47) on 1800 units/hr. CBC relatively stable. No bleeding noted.  Goal of Therapy:  INR 2-3 Heparin level 0.3-0.7 Monitor platelets by anticoagulation protocol: Yes   Plan:  1) Continue heparin drip at 1800 units/hr 2) Coumadin 10mg  x 1 again today 3) Daily INR, heparin level, and CBC  Christoper Fabian, PharmD, BCPS Clinical pharmacist, pager  [phone] 06/07/2013,11:27 AM

## 2013-06-07 NOTE — Progress Notes (Signed)
[facility] PROGRESS NOTE  Ralph [name] HYQ:[id number] DOB: May 29, 1966 DOA: 06/05/2013 PCP: Marylen Ponto, MD  Assessment/Plan:  47 y.o. male with hx of lumbar spinal surgery 4 days ago, hx of sleep apnea on CPAP, HTN, baclofen pump, who was at home and checked by a friend with a pulse oximetry and found that he was hypoxic, brought to the ER for evaluation. Patient himself denied feeling short of breath or any chest pain. Subsequent evaluation in the ER including a positive D dimer at 2.17, culminating into a CTA which showed multiple bilateral pulmonary emboli, most are non-occlusive, except in the RLL which appears to be occlusive  1. BL PE, hemodynamically stable; episode of hypoxia resolved ; CTA: multiple bilateral pulmonary emboli; R LE DVT on [place]; echo: normal LVEF  -cont IV heparin, bridging with heparin; d/w patient at lenth risk of bleeding/complication with anticoagulation, and with recent surgery -d/w dr. Jeffie Pollock [facility] surgery   2. Positive troponin likely related to PE; ECG no acute changes; no active cardiopulmonary symptoms;  -echo: normal LVEF; serial trop neg;   3. Chronic back pain s/p lumbar fusion on 10/30; d/w Dr. Jeffie Pollock [facility];  -cont monitoring on anticoagulation, pain control; defer to surgery   4. HTN cont BB; monitor   Code Status: full Family Communication: wife, family at the bedside (indicate person spoken with, relationship, and if by phone, the number) Disposition Plan: home wnen ready    Consultants:  Phone c/s with spine surgeon   Procedures:  CTA  Antibiotics:  None  (indicate start date, and stop date if known)  HPI/Subjective: Alert, no distress   Objective: Filed Vitals:   06/07/13 0607  BP: 140/84  Pulse:   Temp: 99.1 F (37.3 C)  Resp:     Intake/Output Summary (Last 24 hours) at 06/07/13 0852 Last data filed at 06/07/13 0600  Gross per 24 hour  Intake    936 ml  Output    401 ml  Net    535 ml    Filed Weights   06/06/13 0355  Weight: 107.457 kg (236 lb 14.4 oz)    Exam:   General:  alert  Cardiovascular: s1,s2 rrr  Respiratory: CTA BL   Abdomen: soft, nt, nd   Musculoskeletal: NO LE edema   Data Reviewed: Basic Metabolic Panel:  Recent Labs Lab 06/04/13 1056 06/05/13 2041  NA 136 136  K 4.3 3.7  CL 100 101  CO2 30 31  GLUCOSE 111* 110*  BUN 14 10  CREATININE 1.27 0.73  CALCIUM 8.2* 8.6   Liver Function Tests: No results found for this basename: AST, ALT, ALKPHOS, BILITOT, PROT, ALBUMIN,  in the last 168 hours No results found for this basename: LIPASE, AMYLASE,  in the last 168 hours No results found for this basename: AMMONIA,  in the last 168 hours CBC:  Recent Labs Lab 06/04/13 1056 06/05/13 2041 06/06/13 1220 06/07/13 0100  WBC 12.0* 7.2 6.7 6.8  NEUTROABS 9.9*  --   --   --   HGB 12.1* 11.8* 12.2* 11.5*  HCT 37.3* 34.2* 36.0* 33.4*  MCV 91.9 86.8 87.8 87.2  PLT 171 228 224 246   Cardiac Enzymes:  Recent Labs Lab 06/06/13 0045 06/06/13 1230 06/06/13 1830 06/07/13 0100  CKTOTAL 743*  --   --   --   CKMB 6.4*  --   --   --   TROPONINI 0.33* <0.30 <0.30 <0.30   BNP (last 3 results) No results found for  this basename: PROBNP,  in the last 8760 hours CBG:  Recent Labs Lab 06/04/13 0840  GLUCAP 144*    No results found for this or any previous visit (from the past 240 hour(s)).   Studies: Dg Chest 2 View  06/05/2013   CLINICAL DATA:  Shortness of breath.  EXAM: CHEST  2 VIEW  COMPARISON:  CHEST x-ray 05/27/2013.  FINDINGS: Lung volumes are normal. No consolidative airspace disease. No pleural effusions. No pneumothorax. No pulmonary nodule or mass noted. Pulmonary vasculature and the cardiomediastinal silhouette are within normal limits.  IMPRESSION: 1.  No radiographic evidence of acute cardiopulmonary disease.   Electronically Signed   By: Trudie Reed M.D.   On: 06/05/2013 22:28   Ct Angio Chest Pe W/cm &/or Wo  Cm  06/06/2013   CLINICAL DATA:  Shortness of breath.  EXAM: CT ANGIOGRAPHY CHEST WITH CONTRAST  TECHNIQUE: Multidetector CT imaging of the chest was performed using the standard protocol during bolus administration of intravenous contrast. Multiplanar CT image reconstructions including MIPs were obtained to evaluate the vascular anatomy.  CONTRAST:  OMNIPAQUE IOHEXOL 350 MG/ML SOLN  COMPARISON:  No priors.  FINDINGS: Mediastinum: Multiple filling defects are noted in the pulmonary arterial tree bilaterally, compatible with acute pulmonary emboli. These include segmental and subsegmental sized filling defects predominantly in the lower lobes of the lungs bilaterally. The majority of these appear nonocclusive, however, there does appear to be a completely occlusive embolus in the right lower lobe best demonstrated on image 52 of series 4. No saddle emboli identified. Mild cardiomegaly. Small amount of pericardial fluid and/or thickening, unlikely to be of hemodynamic significance at this time. No pathologically enlarged mediastinal or hilar lymph nodes. Esophagus is unremarkable in appearance.  Lungs/Pleura: No consolidative airspace disease to suggest hemorrhage from pulmonary infarction at this time. Linear opacities in the left lower lobe compatible with subsegmental atelectasis or scarring. Trace bilateral pleural effusions layering dependently. No definite suspicious appearing pulmonary nodules or masses are noted.  Upper Abdomen: Unremarkable.  Musculoskeletal: There are no aggressive appearing lytic or blastic lesions noted in the visualized portions of the skeleton.  Review of the MIP images confirms the above findings.  IMPRESSION: 1. Study is positive for multiple bilateral pulmonary emboli predominantly involving segmental and subsegmental sized branches to the lower lobes of the lungs. 2. Trace bilateral pleural effusions layering dependently. 3. Cardiomegaly with trace amount of pericardial fluid  and/or thickening, unlikely to be of hemodynamic significance at this time.   Electronically Signed   By: Trudie Reed M.D.   On: 06/06/2013 00:59    Scheduled Meds: . amitriptyline  150 mg Oral QHS  . benazepril  20 mg Oral Daily  . docusate sodium  100 mg Oral BID  . gabapentin  800 mg Oral TID  . metoprolol succinate  50 mg Oral Daily  . pantoprazole  40 mg Oral Daily  . sodium chloride  3 mL Intravenous Q12H  . warfarin   Does not apply Once  . Warfarin - Pharmacist Dosing Inpatient   Does not apply q1800   Continuous Infusions: . heparin 1,800 Units/hr (06/07/13 0102)    Principal Problem:   Acute pulmonary embolism Active Problems:   Other quadriplegia and quadriparesis   Status post lumbar surgery    Time spent: >35 minutes     Esperanza Sheets  [facility] Pager [phone]. If 7PM-7AM, please contact night-coverage at [email or web address], password [facility] 06/07/2013, 8:52 AM  LOS: 2 days

## 2013-06-08 LAB — CBC
Hemoglobin: 12.6 g/dL — ABNORMAL LOW (ref 13.0–17.0)
MCH: 29.8 pg (ref 26.0–34.0)
MCHC: 34.2 g/dL (ref 30.0–36.0)
MCV: 87 fL (ref 78.0–100.0)
Platelets: 236 10*3/uL (ref 150–400)
RDW: 13.7 % (ref 11.5–15.5)

## 2013-06-08 LAB — HEPARIN LEVEL (UNFRACTIONATED): Heparin Unfractionated: 0.42 IU/mL (ref 0.30–0.70)

## 2013-06-08 LAB — PROTIME-INR: Prothrombin Time: 21.4 seconds — ABNORMAL HIGH (ref 11.6–15.2)

## 2013-06-08 MED ORDER — WARFARIN SODIUM 5 MG PO TABS
5.0000 mg | ORAL_TABLET | Freq: Once | ORAL | Status: AC
  Administered 2013-06-08: 5 mg via ORAL
  Filled 2013-06-08: qty 1

## 2013-06-08 NOTE — Progress Notes (Signed)
ANTICOAGULATION CONSULT NOTE - Follow Up Consult  Pharmacy Consult for Heparin + Coumadin Indication: pulmonary embolus  No Known Allergies  Patient Measurements: Height: 6' (182.9 cm) Weight: 236 lb 14.4 oz (107.457 kg) IBW/kg (Calculated) : 77.6 Heparin Dosing Weight: 99kg  Vital Signs: Temp: 98.4 F (36.9 C) (11/05 0425) Temp src: Oral (11/05 0425) BP: 167/98 mmHg (11/05 0425) Pulse Rate: 61 (11/05 0425)  Labs:  Recent Labs  06/05/13 2041  06/06/13 0045  06/06/13 1220 06/06/13 1230 06/06/13 1830 06/06/13 2105 06/07/13 0100 06/07/13 0956 06/08/13 0458 06/08/13 0624  HGB 11.8*  --   --   --  12.2*  --   --   --  11.5*  --  12.6*  --   HCT 34.2*  --   --   --  36.0*  --   --   --  33.4*  --  36.8*  --   PLT 228  --   --   --  224  --   --   --  246  --  236  --   LABPROT  --   --   --   --  14.3  --   --   --  14.7  --   --  21.4*  INR  --   --   --   --  1.13  --   --   --  1.17  --   --  1.92*  HEPARINUNFRC  --   --   --   < > <0.10*  --   --  0.47  --  0.47  --  0.42  CREATININE 0.73  --   --   --   --   --   --   --   --   --   --   --   CKTOTAL  --   --  743*  --   --   --   --   --   --   --   --   --   CKMB  --   --  6.4*  --   --   --   --   --   --   --   --   --   TROPONINI  --   < > 0.33*  --   --  <0.30 <0.30  --  <0.30  --   --   --   < > = values in this interval not displayed.  Estimated Creatinine Clearance: 146.2 ml/min (by C-G formula based on Cr of 0.73).   Medications:  Heparin @ 1800 units/hr  Assessment: 47yom continues on day #3/5 overlap with heparin and coumadin for new multiple bilateral PEs (11/3). LE dopplers negative for DVT. Heparin level is therapeutic at 0.42. INR has jumped significantly after two doses of 10mg . CBC is stable. No bleeding reported.  Goal of Therapy:  INR 2-3 Heparin level 0.3-0.7 units/ml Monitor platelets by anticoagulation protocol: Yes   Plan:  1) Continue heparin at 1800 units/hr 2) Decrease coumadin  to 5mg  x 1 3) Heparin level, CBC, INR in AM  **INR will likely be > 2 tomorrow, however, patient will still need at least 2 more days of heparin overlap**  Fredrik Rigger 06/08/2013,8:35 AM

## 2013-06-08 NOTE — Progress Notes (Signed)
Patient refused to wear CPAP tonight.  Stated that he was not able to get any sleep.  Was told if he changed his mind to call RT.

## 2013-06-08 NOTE — Progress Notes (Signed)
[facility] PROGRESS NOTE  Ralph [name] [name] DOB: 1965/10/10 DOA: 06/05/2013 PCP: Marylen Ponto, MD  Assessment/Plan:  47 y.o. male with hx of lumbar spinal surgery 4 days ago, hx of sleep apnea on CPAP, HTN, baclofen pump, who was at home and checked by a friend with a pulse oximetry and found that he was hypoxic, brought to the ER for evaluation. Patient himself denied feeling short of breath or any chest pain. Subsequent evaluation in the ER including a positive D dimer at 2.17, culminating into a CTA which showed multiple bilateral pulmonary emboli, most are non-occlusive, except in the RLL which appears to be occlusive  1. BL PE, hemodynamically stable; episode of hypoxia resolved ; CTA: multiple bilateral pulmonary emboli; R LE DVT on [place]; echo: normal LVEF  -IV heparin, bridging coumadin; d/w patient at lenth risk of bleeding/complication with anticoagulation, and with recent surgery -d/w dr. Jeffie Pollock [facility] surgery   2. Positive troponin likely related to PE; ECG no acute changes; no active cardiopulmonary symptoms;  -echo: normal LVEF; serial trop neg;   3. Chronic back pain s/p lumbar fusion on 10/30; d/w Dr. Jeffie Pollock [facility];  -cont monitoring on anticoagulation, pain control; defer to surgery   4. HTN cont BB; monitor   Code Status: full Family Communication: wife, family at the bedside (indicate person spoken with, relationship, and if by phone, the number) Disposition Plan: home wnen ready likely in AM    Consultants:  Phone c/s with spine surgeon   Procedures:  CTA  Antibiotics:  None  (indicate start date, and stop date if known)  HPI/Subjective: Alert, no distress   Objective: Filed Vitals:   06/08/13 0425  BP: 167/98  Pulse: 61  Temp: 98.4 F (36.9 C)  Resp: 20    Intake/Output Summary (Last 24 hours) at 06/08/13 0826 Last data filed at 06/08/13 0426  Gross per 24 hour  Intake    480 ml  Output   1200 ml  Net    -720 ml   Filed Weights   06/06/13 0355  Weight: 107.457 kg (236 lb 14.4 oz)    Exam:   General:  alert  Cardiovascular: s1,s2 rrr  Respiratory: CTA BL   Abdomen: soft, nt, nd   Musculoskeletal: NO LE edema   Data Reviewed: Basic Metabolic Panel:  Recent Labs Lab 06/04/13 1056 06/05/13 2041  NA 136 136  K 4.3 3.7  CL 100 101  CO2 30 31  GLUCOSE 111* 110*  BUN 14 10  CREATININE 1.27 0.73  CALCIUM 8.2* 8.6   Liver Function Tests: No results found for this basename: AST, ALT, ALKPHOS, BILITOT, PROT, ALBUMIN,  in the last 168 hours No results found for this basename: LIPASE, AMYLASE,  in the last 168 hours No results found for this basename: AMMONIA,  in the last 168 hours CBC:  Recent Labs Lab 06/04/13 1056 06/05/13 2041 06/06/13 1220 06/07/13 0100 06/08/13 0458  WBC 12.0* 7.2 6.7 6.8 6.2  NEUTROABS 9.9*  --   --   --   --   HGB 12.1* 11.8* 12.2* 11.5* 12.6*  HCT 37.3* 34.2* 36.0* 33.4* 36.8*  MCV 91.9 86.8 87.8 87.2 87.0  PLT 171 228 224 246 236   Cardiac Enzymes:  Recent Labs Lab 06/06/13 0045 06/06/13 1230 06/06/13 1830 06/07/13 0100  CKTOTAL 743*  --   --   --   CKMB 6.4*  --   --   --   TROPONINI 0.33* <0.30 <0.30 <0.30  BNP (last 3 results) No results found for this basename: PROBNP,  in the last 8760 hours CBG:  Recent Labs Lab 06/04/13 0840  GLUCAP 144*    No results found for this or any previous visit (from the past 240 hour(s)).   Studies: No results found.  Scheduled Meds: . amitriptyline  150 mg Oral Q24H  . benazepril  20 mg Oral Q24H  . docusate sodium  100 mg Oral BID  . gabapentin  800 mg Oral TID  . metoprolol succinate  50 mg Oral Q24H  . pantoprazole  40 mg Oral Daily  . sodium chloride  3 mL Intravenous Q12H  . warfarin   Does not apply Once  . Warfarin - Pharmacist Dosing Inpatient   Does not apply q1800   Continuous Infusions: . heparin 1,800 Units/hr (06/08/13 0434)    Principal Problem:   Acute  pulmonary embolism Active Problems:   Other quadriplegia and quadriparesis   Status post lumbar surgery    Time spent: >35 minutes     Esperanza Sheets  [facility] Pager [phone]. If 7PM-7AM, please contact night-coverage at [email or web address], password [facility] 06/08/2013, 8:26 AM  LOS: 3 days

## 2013-06-08 NOTE — ED Provider Notes (Signed)
Medical screening examination/treatment/procedure(s) were conducted as a shared visit with non-physician practitioner(s) and myself.  I personally evaluated the patient during the encounter.  EKG Interpretation     Ventricular Rate:  67 PR Interval:  194 QRS Duration: 102 QT Interval:  395 QTC Calculation: 417 R Axis:   28 Text Interpretation:  Sinus rhythm             Patient here with SOB, hypoxia reported at home on fingertip oxygen monitorin. Recent lumbar fusion. Here AFVSS, EKG without ischemia. Lungs clear, satting 100% on room air. Elevated D-dimer and troponin. Will hold off on heparin until I speak with Ortho since he's had spinal surgery within the past week. Ortho agreed to heparin if needed for NSTEMI or PE. CT scan shows multiple bilateral lower PEs. CT reviewed by me. Patient admitted to hospitalist.  Dagmar Hait, MD 06/08/13 [phone]

## 2013-06-09 LAB — CBC
HCT: 36.9 % — ABNORMAL LOW (ref 39.0–52.0)
MCH: 29.4 pg (ref 26.0–34.0)
MCHC: 33.3 g/dL (ref 30.0–36.0)
MCV: 88.1 fL (ref 78.0–100.0)
Platelets: 299 10*3/uL (ref 150–400)
RBC: 4.19 MIL/uL — ABNORMAL LOW (ref 4.22–5.81)

## 2013-06-09 LAB — PROTIME-INR: INR: 2.18 — ABNORMAL HIGH (ref 0.00–1.49)

## 2013-06-09 MED ORDER — ENOXAPARIN SODIUM 100 MG/ML ~~LOC~~ SOLN: 100.0000 mg | Freq: Two times a day (BID) | SUBCUTANEOUS | Status: DC

## 2013-06-09 MED ORDER — ENOXAPARIN SODIUM 100 MG/ML ~~LOC~~ SOLN
100.0000 mg | Freq: Two times a day (BID) | SUBCUTANEOUS | Status: DC
  Administered 2013-06-09: 100 mg via SUBCUTANEOUS
  Filled 2013-06-09 (×2): qty 1

## 2013-06-09 MED ORDER — WARFARIN SODIUM 5 MG PO TABS: 5.0000 mg | ORAL_TABLET | Freq: Every day | ORAL | Status: DC

## 2013-06-09 NOTE — Progress Notes (Signed)
ANTICOAGULATION CONSULT NOTE - Follow Up Consult  Pharmacy Consult for heparin  Indication: pulmonary embolus  No Known Allergies  Patient Measurements: Height: 6' (182.9 cm) Weight: 236 lb 14.4 oz (107.457 kg) IBW/kg (Calculated) : 77.6 Heparin Dosing Weight:   Vital Signs: Temp: 99.2 F (37.3 C) (11/06 0509) Temp src: Oral (11/06 0509) BP: 182/98 mmHg (11/06 0509) Pulse Rate: 130 (11/06 0509)  Labs:  Recent Labs  06/06/13 1230 06/06/13 1830  06/07/13 0100 06/07/13 0956 06/08/13 0458 06/08/13 0624 06/09/13 0457  HGB  --   --   --  11.5*  --  12.6*  --  12.3*  HCT  --   --   --  33.4*  --  36.8*  --  36.9*  PLT  --   --   --  246  --  236  --  299  LABPROT  --   --   --  14.7  --   --  21.4* 23.6*  INR  --   --   --  1.17  --   --  1.92* 2.18*  HEPARINUNFRC  --   --   < >  --  0.47  --  0.42 0.15*  TROPONINI <0.30 <0.30  --  <0.30  --   --   --   --   < > = values in this interval not displayed.  Estimated Creatinine Clearance: 146.2 ml/min (by C-G formula based on Cr of 0.73).    Assessment: Heparin 0.18 no bleeding noted. No infusion interruptions or issues. INR is therapeutic this am at 2.18.  Goal of Therapy:  Heparin level 0.3-0.7 units/ml Monitor platelets by anticoagulation protocol: Yes   Plan:  Increase heparin to 2100 units/hr and recheck in 6 hours.   Janice Coffin 06/09/2013,7:10 AM

## 2013-06-09 NOTE — Discharge Summary (Addendum)
Physician Discharge Summary  Ralph [name] ZOX:[id number] DOB: 12-30-1965 DOA: 06/05/2013  PCP: Marylen Ponto, MD  Admit date: 06/05/2013 Discharge date: 06/09/2013  Time spent: >35 minutes  Recommendations for Outpatient Follow-up:  F/u with PCP tomorrow to check INR; d/w patient, called and agreed with his PCP F/u with surgery as scheduled   Discharge Diagnoses:  Principal Problem:   Acute pulmonary embolism Active Problems:   Other quadriplegia and quadriparesis   Status post lumbar surgery   Discharge Condition: stable   Diet recommendation: heart healthy   Filed Weights   06/06/13 0355  Weight: 107.457 kg (236 lb 14.4 oz)    History of present illness:  47 y.o. male with hx of lumbar spinal surgery 4 days ago, hx of sleep apnea on CPAP, HTN, baclofen pump, who was at home and checked by a friend with a pulse oximetry and found that he was hypoxic, brought to the ER for evaluation. Patient himself denied feeling short of breath or any chest pain. Subsequent evaluation in the ER including a positive D dimer at 2.17, culminating into a CTA which showed multiple bilateral pulmonary emboli, most are non-occlusive, except in the RLL which appears to be occlusive   Hospital Course:  1. BL PE; CTA: multiple bilateral pulmonary emboli; R LE DVT on [place]; echo: normal LVEF  -bridged with IV heparin/coumadin; one more day of lovenox at home; d/w patient at lenth risk of bleeding/complication with anticoagulation, and with recent surgery; d/w dr. Jeffie Pollock [facility] surgery  -cont coumadin likely need 6 month of anticoagulation  2. Positive troponin likely related to PE; ECG no acute changes; no active cardiopulmonary symptoms;  -echo: normal LVEF; serial trop neg;  3. Chronic back pain s/p lumbar fusion on 10/30; d/w Dr. Jeffie Pollock [facility];  -cont monitoring on anticoagulation, pain control; f/u with surgery as scheduled  4. HTN cont BB; d/w patient about the need to increase  medications; he would prefer to f/u with PCP tomorrow to discuss options     Procedures:  CTA as above  (i.e. Studies not automatically included, echos, thoracentesis, etc; not x-rays)  Consultations:  None   Discharge Exam: Filed Vitals:   06/09/13 0509  BP: 182/98  Pulse: 130  Temp: 99.2 F (37.3 C)  Resp: 20    General: alert Cardiovascular: s1,s2 rrr Respiratory: CTA BL   Discharge Instructions  Discharge Orders   Future Appointments Provider Department Dept Phone   06/20/2013 12:00 PM Ralph Spaniel, MD [facility] [phone]   Future Orders Complete By Expires   Diet - low sodium heart healthy  As directed    Discharge instructions  As directed    Comments:     Please follow up with primary care doctor tomorrow to check your INR   Increase activity slowly  As directed        Medication List         amitriptyline 75 MG tablet  Commonly known as:  ELAVIL  Take 150 mg by mouth at bedtime.     BACLOFEN IT  by Intrathecal route.     benazepril 20 MG tablet  Commonly known as:  LOTENSIN  Take 20 mg by mouth daily.     diazepam 5 MG tablet  Commonly known as:  VALIUM  Take 5 mg by mouth at bedtime as needed for sleep.     enoxaparin 100 MG/ML injection  Commonly known as:  LOVENOX  Inject 1 mL (100 mg total) into the  skin every 12 (twelve) hours.     gabapentin 800 MG tablet  Commonly known as:  NEURONTIN  Take 800 mg by mouth 3 (three) times daily.     L-ARGININE PO  Take by mouth.     metoprolol succinate 50 MG 24 hr tablet  Commonly known as:  TOPROL-XL  Take 50 mg by mouth daily. Take with or immediately following a meal.     omeprazole 20 MG capsule  Commonly known as:  PRILOSEC  Take 40 mg by mouth daily.     warfarin 5 MG tablet  Commonly known as:  COUMADIN  Take 1 tablet (5 mg total) by mouth daily.       No Known Allergies     Follow-up Information   Follow up with HOLT,[name], MD In 1 day.    Specialty:  Family Medicine   Contact information:   [address] [place] [address] [phone]        The results of significant diagnostics from this hospitalization (including imaging, microbiology, ancillary and laboratory) are listed below for reference.    Significant Diagnostic Studies: Dg Chest 2 View  06/05/2013   CLINICAL DATA:  Shortness of breath.  EXAM: CHEST  2 VIEW  COMPARISON:  CHEST x-ray 05/27/2013.  FINDINGS: Lung volumes are normal. No consolidative airspace disease. No pleural effusions. No pneumothorax. No pulmonary nodule or mass noted. Pulmonary vasculature and the cardiomediastinal silhouette are within normal limits.  IMPRESSION: 1.  No radiographic evidence of acute cardiopulmonary disease.   Electronically Signed   By: Trudie Reed M.D.   On: 06/05/2013 22:28   Dg Chest 2 View  05/27/2013   CLINICAL DATA:  Pre admit for lumbar surgery  EXAM: CHEST  2 VIEW  COMPARISON:  None.  FINDINGS: The heart size and mediastinal contours are within normal limits. Both lungs are clear. The visualized skeletal structures are unremarkable.  IMPRESSION: No active cardiopulmonary disease.   Electronically Signed   By: Genevive Bi M.D.   On: 05/27/2013 13:33   Dg Lumbar Spine 2-3 Views  06/02/2013   CLINICAL DATA:  L3-4 fixation.  EXAM: LUMBAR SPINE - 2-3 VIEW; DG C-ARM 1-60 MIN  COMPARISON:  Intraoperative study of earlier in the day. MRI of 11/08/2012.  FINDINGS: AP and lateral views. These demonstrate trans pedicle screw fixation at L4-5. No acute hardware complication.  IMPRESSION: Trans pedicle screw fixation at L4-5. The clinical history describes "L3-4 fixation", but the prior MRI of 11/08/2012 demonstrates extensive abnormalities at L4-5. Correlate with the desired surgical level, which is presumably L4-5.   Electronically Signed   By: Jeronimo Greaves M.D.   On: 06/02/2013 13:47   Ct Angio Chest Pe W/cm &/or Wo Cm  06/06/2013   CLINICAL DATA:  Shortness of  breath.  EXAM: CT ANGIOGRAPHY CHEST WITH CONTRAST  TECHNIQUE: Multidetector CT imaging of the chest was performed using the standard protocol during bolus administration of intravenous contrast. Multiplanar CT image reconstructions including MIPs were obtained to evaluate the vascular anatomy.  CONTRAST:  OMNIPAQUE IOHEXOL 350 MG/ML SOLN  COMPARISON:  No priors.  FINDINGS: Mediastinum: Multiple filling defects are noted in the pulmonary arterial tree bilaterally, compatible with acute pulmonary emboli. These include segmental and subsegmental sized filling defects predominantly in the lower lobes of the lungs bilaterally. The majority of these appear nonocclusive, however, there does appear to be a completely occlusive embolus in the right lower lobe best demonstrated on image 52 of series 4. No saddle  emboli identified. Mild cardiomegaly. Small amount of pericardial fluid and/or thickening, unlikely to be of hemodynamic significance at this time. No pathologically enlarged mediastinal or hilar lymph nodes. Esophagus is unremarkable in appearance.  Lungs/Pleura: No consolidative airspace disease to suggest hemorrhage from pulmonary infarction at this time. Linear opacities in the left lower lobe compatible with subsegmental atelectasis or scarring. Trace bilateral pleural effusions layering dependently. No definite suspicious appearing pulmonary nodules or masses are noted.  Upper Abdomen: Unremarkable.  Musculoskeletal: There are no aggressive appearing lytic or blastic lesions noted in the visualized portions of the skeleton.  Review of the MIP images confirms the above findings.  IMPRESSION: 1. Study is positive for multiple bilateral pulmonary emboli predominantly involving segmental and subsegmental sized branches to the lower lobes of the lungs. 2. Trace bilateral pleural effusions layering dependently. 3. Cardiomegaly with trace amount of pericardial fluid and/or thickening, unlikely to be of  hemodynamic significance at this time.   Electronically Signed   By: Trudie Reed M.D.   On: 06/06/2013 00:59   Dg Lumbar Spine 1 View  06/02/2013   CLINICAL DATA:  L4-L5 fusion  EXAM: LUMBAR SPINE - 1 VIEW  COMPARISON:  Portable cross-table intraoperative lateral view lumbar spine performed and compared to prior prior MRI lumbar spine of 11/08/2012  FINDINGS: Exam #1 at 0744 hr interpreted at 1113 hr.  Prior MRI labeled with 5 lumbar vertebrae, current exam labeled similarly.l  Two metallic probes via posterior approach projects dorsal to the mid L4 and L5 levels.  IMPRESSION: Intraoperative localization view of the lumbar spine as above.   Electronically Signed   By: Ulyses Southward M.D.   On: 06/02/2013 11:14   Dg C-arm 1-60 Min  06/02/2013   CLINICAL DATA:  L3-4 fixation.  EXAM: LUMBAR SPINE - 2-3 VIEW; DG C-ARM 1-60 MIN  COMPARISON:  Intraoperative study of earlier in the day. MRI of 11/08/2012.  FINDINGS: AP and lateral views. These demonstrate trans pedicle screw fixation at L4-5. No acute hardware complication.  IMPRESSION: Trans pedicle screw fixation at L4-5. The clinical history describes "L3-4 fixation", but the prior MRI of 11/08/2012 demonstrates extensive abnormalities at L4-5. Correlate with the desired surgical level, which is presumably L4-5.   Electronically Signed   By: Jeronimo Greaves M.D.   On: 06/02/2013 13:47    Microbiology: No results found for this or any previous visit (from the past 240 hour(s)).   Labs: Basic Metabolic Panel:  Recent Labs Lab 06/04/13 1056 06/05/13 2041  NA 136 136  K 4.3 3.7  CL 100 101  CO2 30 31  GLUCOSE 111* 110*  BUN 14 10  CREATININE 1.27 0.73  CALCIUM 8.2* 8.6   Liver Function Tests: No results found for this basename: AST, ALT, ALKPHOS, BILITOT, PROT, ALBUMIN,  in the last 168 hours No results found for this basename: LIPASE, AMYLASE,  in the last 168 hours No results found for this basename: AMMONIA,  in the last 168  hours CBC:  Recent Labs Lab 06/04/13 1056 06/05/13 2041 06/06/13 1220 06/07/13 0100 06/08/13 0458 06/09/13 0457  WBC 12.0* 7.2 6.7 6.8 6.2 7.3  NEUTROABS 9.9*  --   --   --   --   --   HGB 12.1* 11.8* 12.2* 11.5* 12.6* 12.3*  HCT 37.3* 34.2* 36.0* 33.4* 36.8* 36.9*  MCV 91.9 86.8 87.8 87.2 87.0 88.1  PLT 171 228 224 246 236 299   Cardiac Enzymes:  Recent Labs Lab 06/06/13 0045 06/06/13 1230 06/06/13 1830  06/07/13 0100  CKTOTAL 743*  --   --   --   CKMB 6.4*  --   --   --   TROPONINI 0.33* <0.30 <0.30 <0.30   BNP: BNP (last 3 results) No results found for this basename: PROBNP,  in the last 8760 hours CBG:  Recent Labs Lab 06/04/13 0840  GLUCAP 144*       Signed:  Jonette Mate N  [facility] 06/09/2013, 7:56 AM

## 2013-06-09 NOTE — Progress Notes (Signed)
Tele and IV removed per MD and discharge; Lovenox injection given; discharge paper work reviewed and signed; paper prescriptions given to patient and wife; no further questions at this time.  [name], Ralph Baseman

## 2013-06-13 ENCOUNTER — Telehealth: Payer: Self-pay | Admitting: Neurology

## 2013-06-13 NOTE — Telephone Encounter (Signed)
Called patient and left VM message that patient's pump does not expire until 11/19

## 2013-06-17 ENCOUNTER — Ambulatory Visit (INDEPENDENT_AMBULATORY_CARE_PROVIDER_SITE_OTHER): Payer: Medicare Other | Admitting: Neurology

## 2013-06-17 ENCOUNTER — Encounter: Payer: Self-pay | Admitting: Neurology

## 2013-06-17 VITALS — BP 155/91 | HR 89 | Wt 224.0 lb

## 2013-06-17 DIAGNOSIS — H5310 Unspecified subjective visual disturbances: Secondary | ICD-10-CM

## 2013-06-17 DIAGNOSIS — R269 Unspecified abnormalities of gait and mobility: Secondary | ICD-10-CM

## 2013-06-17 HISTORY — DX: Unspecified subjective visual disturbances: H53.10

## 2013-06-17 NOTE — Progress Notes (Signed)
      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 The patient has a baclofen pump in place, he returns to the office today for a pump refill. The patient is doing well with his current level spasticity, recently underwent lumbosacral spine surgery.  Baclofen pump refill note  The baclofen pump site was cleaned with Betadine solution. A 21-gauge needle was inserted into the pump port site. Approximately 4 cc of residual baclofen was removed. 20 cc of replacement baclofen was placed into the pump at 2000 mcg/cc concentration.  The pump was reprogrammed for the following settings: Pump settings remain at a simple continuous dosing at 215.1 mcg daily.  The alarm volume is set at 2.0. The next alarm date is 12/01/2013.  The patient tolerated the procedure well. There were no complications of the above procedure. The patient currently only requires 20 cc of baclofen, not 40 cc.     Since last seen, the patient has undergone lumbosacral spine surgery. During the hospitalization, the patient was oversedated with opiate medication, and he was found to have a DVT in the right leg, with a pulmonary embolism. The patient currently is on Coumadin. Since coming out of the hospital, the patient has noted that he is having difficulty with his vision, unable to read normally. The patient has difficulty making out letters in the words. The patient denies any focal numbness or weakness of the face, arms, or legs. Otherwise, no speech problems have been noted.  Given the above history, the patient will be sent for a CT scan of the brain to exclude the possibility of a stroke event. If a stroke is present, evaluation for a patent foramen ovale will need to be done. Otherwise, the patient will followup for his next baclofen pump refill.

## 2013-06-20 ENCOUNTER — Ambulatory Visit: Payer: Self-pay | Admitting: Neurology

## 2013-06-23 ENCOUNTER — Ambulatory Visit
Admission: RE | Admit: 2013-06-23 | Discharge: 2013-06-23 | Disposition: A | Discharge status: Home / Self Care | Payer: Medicare Other | Source: Ambulatory Visit | Attending: Neurology | Admitting: Neurology

## 2013-06-23 DIAGNOSIS — H5310 Unspecified subjective visual disturbances: Secondary | ICD-10-CM

## 2013-06-23 DIAGNOSIS — R269 Unspecified abnormalities of gait and mobility: Secondary | ICD-10-CM

## 2013-06-27 ENCOUNTER — Telehealth: Payer: Self-pay | Admitting: Neurology

## 2013-06-27 DIAGNOSIS — I6340 Cerebral infarction due to embolism of unspecified cerebral artery: Secondary | ICD-10-CM

## 2013-06-27 NOTE — Telephone Encounter (Signed)
I called patient. The CT scan of brain shows evidence of a small wedge infarct in the right parieto-occipital area with some mild hemorrhagic conversion. 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 The patient will be set up for a transcranial Doppler bubble study, and a carotid Doppler study. The patient very well may have a PFO. The patient is to remain on Coumadin at this time.

## 2013-07-13 ENCOUNTER — Ambulatory Visit (INDEPENDENT_AMBULATORY_CARE_PROVIDER_SITE_OTHER): Payer: Self-pay

## 2013-07-13 ENCOUNTER — Encounter: Payer: Self-pay | Admitting: Neurology

## 2013-07-13 ENCOUNTER — Telehealth: Payer: Self-pay | Admitting: Neurology

## 2013-07-13 ENCOUNTER — Ambulatory Visit (INDEPENDENT_AMBULATORY_CARE_PROVIDER_SITE_OTHER): Payer: Medicare Other | Admitting: Neurology

## 2013-07-13 VITALS — BP 150/94 | HR 66

## 2013-07-13 DIAGNOSIS — Z0289 Encounter for other administrative examinations: Secondary | ICD-10-CM

## 2013-07-13 DIAGNOSIS — I6340 Cerebral infarction due to embolism of unspecified cerebral artery: Secondary | ICD-10-CM

## 2013-07-13 DIAGNOSIS — I6350 Cerebral infarction due to unspecified occlusion or stenosis of unspecified cerebral artery: Secondary | ICD-10-CM

## 2013-07-13 HISTORY — PX: OTHER SURGICAL HISTORY: SHX169

## 2013-07-13 NOTE — Telephone Encounter (Signed)
I called patient. The transcranial Doppler bubble study was positive. The patient is on Coumadin, when he comes off, the patient should be on aspirin or Plavix. I have discussed this with Dr. Pearlean Brownie , who indicates that he does not need a TEE in the future, but the PFO is the likely source of the stroke, and the stroke occurred at the time of the pulmonary embolism.
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
## 2013-07-19 ENCOUNTER — Telehealth: Payer: Self-pay | Admitting: Neurology

## 2013-07-19 NOTE — Telephone Encounter (Signed)
I called patient. The carotid Doppler study was unremarkable. The prior transcranial Doppler study did show a right to left shunt flow, likely the source of the stroke at the time of the pulmonary embolism.

## 2013-08-30 ENCOUNTER — Emergency Department (HOSPITAL_COMMUNITY)
Admission: EM | Admit: 2013-08-30 | Discharge: 2013-08-30 | Disposition: A | Discharge status: Home / Self Care | Payer: Medicare Other | Attending: Emergency Medicine | Admitting: Emergency Medicine

## 2013-08-30 ENCOUNTER — Encounter (HOSPITAL_COMMUNITY): Payer: Self-pay | Admitting: Emergency Medicine

## 2013-08-30 DIAGNOSIS — G8250 Quadriplegia, unspecified: Secondary | ICD-10-CM | POA: Insufficient documentation

## 2013-08-30 DIAGNOSIS — K219 Gastro-esophageal reflux disease without esophagitis: Secondary | ICD-10-CM | POA: Insufficient documentation

## 2013-08-30 DIAGNOSIS — I10 Essential (primary) hypertension: Secondary | ICD-10-CM | POA: Insufficient documentation

## 2013-08-30 DIAGNOSIS — Z86711 Personal history of pulmonary embolism: Secondary | ICD-10-CM | POA: Insufficient documentation

## 2013-08-30 DIAGNOSIS — Z8781 Personal history of (healed) traumatic fracture: Secondary | ICD-10-CM | POA: Insufficient documentation

## 2013-08-30 DIAGNOSIS — Z8614 Personal history of Methicillin resistant Staphylococcus aureus infection: Secondary | ICD-10-CM | POA: Insufficient documentation

## 2013-08-30 DIAGNOSIS — E669 Obesity, unspecified: Secondary | ICD-10-CM | POA: Insufficient documentation

## 2013-08-30 DIAGNOSIS — Z79899 Other long term (current) drug therapy: Secondary | ICD-10-CM | POA: Insufficient documentation

## 2013-08-30 DIAGNOSIS — Z86718 Personal history of other venous thrombosis and embolism: Secondary | ICD-10-CM | POA: Insufficient documentation

## 2013-08-30 DIAGNOSIS — G8389 Other specified paralytic syndromes: Secondary | ICD-10-CM

## 2013-08-30 DIAGNOSIS — G4733 Obstructive sleep apnea (adult) (pediatric): Secondary | ICD-10-CM | POA: Insufficient documentation

## 2013-08-30 DIAGNOSIS — N390 Urinary tract infection, site not specified: Secondary | ICD-10-CM | POA: Insufficient documentation

## 2013-08-30 DIAGNOSIS — Z87828 Personal history of other (healed) physical injury and trauma: Secondary | ICD-10-CM | POA: Insufficient documentation

## 2013-08-30 DIAGNOSIS — Z9981 Dependence on supplemental oxygen: Secondary | ICD-10-CM | POA: Insufficient documentation

## 2013-08-30 DIAGNOSIS — R339 Retention of urine, unspecified: Secondary | ICD-10-CM | POA: Insufficient documentation

## 2013-08-30 DIAGNOSIS — Z7901 Long term (current) use of anticoagulants: Secondary | ICD-10-CM | POA: Insufficient documentation

## 2013-08-30 LAB — URINE MICROSCOPIC-ADD ON

## 2013-08-30 LAB — URINALYSIS, ROUTINE W REFLEX MICROSCOPIC
Bilirubin Urine: NEGATIVE
Glucose, UA: NEGATIVE mg/dL
Ketones, ur: NEGATIVE mg/dL
NITRITE: NEGATIVE
PROTEIN: 30 mg/dL — AB
Specific Gravity, Urine: 1.01 (ref 1.005–1.030)
Urobilinogen, UA: 1 mg/dL (ref 0.0–1.0)
pH: 6 (ref 5.0–8.0)

## 2013-08-30 MED ORDER — CIPROFLOXACIN HCL 500 MG PO TABS: 500.0000 mg | ORAL_TABLET | Freq: Two times a day (BID) | ORAL | Status: DC

## 2013-08-30 NOTE — ED Notes (Signed)
Patient presents stating the he has been having trouble urinating.  States he noticed that it was burning but only has a small trickle when he urinates

## 2013-08-30 NOTE — Discharge Instructions (Signed)
Please followup with your primary care provider or a urology specialist for continued evaluation and treatment of your urinary tract infection and urinary retention.     Acute Urinary Retention Acute urinary retention is when you are unable to pee (urinate). Acute urinary retention is common in older men. Prostates can get bigger, which blocks the flow of pee.  HOME CARE  Drink enough fluids to keep your pee clear or pale yellow.  If you are sent home with a tube that drains the bladder (catheter), there will be a drainage bag attached to it. There are two types of bags. One is big that you can wear at night without having to empty it. One is smaller and needs to be emptied more often.  Keep the drainage bag empty.  Keep the drainage bag lower than your catheter.  Only take medicine as told by your doctor. GET HELP IF:  You have a low-grade fever.  You have spasms or you are leaking pee when you have spasms. GET HELP RIGHT AWAY IF:   You have chills or a fever.  Your catheter stops draining pee.  Your catheter falls out.  You have increased bleeding that does not stop after you have rested and increased the amount of fluids you had been drinking. MAKE SURE YOU:   Understand these instructions.  Will watch your condition.  Will get help right away if you are not doing well or get worse. Document Released: 01/07/2008 Document Revised: 05/11/2013 Document Reviewed: 12/30/2012 [facility] Patient Information 2014 [place], [place].    Urinary Tract Infection A urinary tract infection (UTI) can occur any place along the urinary tract. The tract includes the kidneys, ureters, bladder, and urethra. A type of germ called bacteria often causes a UTI. UTIs are often helped with antibiotic medicine.  HOME CARE   If given, take antibiotics as told by your doctor. Finish them even if you start to feel better.  Drink enough fluids to keep your pee (urine) clear or pale yellow.  Avoid  tea, drinks with caffeine, and bubbly (carbonated) drinks.  Pee often. Avoid holding your pee in for a long time.  Pee before and after having sex (intercourse).  Wipe from front to back after you poop (bowel movement) if you are a woman. Use each tissue only once. GET HELP RIGHT AWAY IF:   You have back pain.  You have lower belly (abdominal) pain.  You have chills.  You feel sick to your stomach (nauseous).  You throw up (vomit).  Your burning or discomfort with peeing does not go away.  You have a fever.  Your symptoms are not better in 3 days. MAKE SURE YOU:   Understand these instructions.  Will watch your condition.  Will get help right away if you are not doing well or get worse. Document Released: 01/07/2008 Document Revised: 04/14/2012 Document Reviewed: 02/19/2012 [facility]ExitCare Patient Information 2014 [place], [place].

## 2013-08-30 NOTE — ED Provider Notes (Signed)
Medical screening examination/treatment/procedure(s) were performed by non-physician practitioner and as supervising physician I was immediately available for consultation/collaboration.   Tsion Inghram, MD 08/30/13 0539 

## 2013-08-30 NOTE — ED Notes (Signed)
PT D/C with foley catheter to follow up with urology .

## 2013-08-30 NOTE — ED Provider Notes (Signed)
CSN: [id number]     Arrival date & time 08/30/13  0030 History   First MD Initiated Contact with Patient 08/30/13 [phone]     Chief Complaint  Patient presents with  . Urinary Retention   HPI  History provided by the patient's and significant other. Patient is a 48 year old male with history of previous cervical spine injury, hypertension, previous PE, who presents with complaints of urinary retention and suprapubic pain and pressure. Patient states he was having slight burning with urination and pressure earlier in the day. He was having only small amounts of urination with dribbling. His symptoms began to worsen gradually throughout the whole day and evening. He began having significant pressure and pain over the suprapubic area. He denies having any hematuria or flank pain. No fever, chills or sweats. No associated nausea or vomiting. He is not using treatments for symptoms. No other aggravating or alleviating factors. No other associated symptoms.    Past Medical History  Diagnosis Date  . Other quadriplegia and quadriparesis   . Gait disorder   . Gastroesophageal reflux disease   . Obesity   . Lumbago   . Leg fracture, right   . Obstructive sleep apnea on CPAP   . Spinal cord injury, C5-C7   . Hypertension     does not see a cardiologist  . History of MRSA infection     wounds in Fingers and then on leg  . Hypoglycemia   . Complication of anesthesia   . PONV (postoperative nausea and vomiting)   . DVT (deep venous thrombosis)     right leg  . Pulmonary embolus    Past Surgical History  Procedure Laterality Date  . Right anterior cruciate ligament repair    . Baclofen pump placement    . Lumbar fusion  06/02/2013    L4  L5  Dr Yevette Edwards  . Transforaminal lumbar interbody fusion (tlif) with pedicle screw fixation 1 level Left 06/02/2013    Procedure: TRANSFORAMINAL LUMBAR INTERBODY FUSION (TLIF) WITH PEDICLE SCREW FIXATION 1 LEVEL;  Surgeon: Emilee Hero, MD;   Location: [facility] OR;  Service: Orthopedics;  Laterality: Left;  Left sided lumbar 4-5 transforaminal lumbar interbody fusion with instrumentation, vitoss, bone marrow aspirate.   History reviewed. No pertinent family history. History  Substance Use Topics  . Smoking status: Never Smoker   . Smokeless tobacco: Current User    Types: Chew  . Alcohol Use: No    Review of Systems  Constitutional: Negative for fever, chills and diaphoresis.  Gastrointestinal: Positive for abdominal pain. Negative for nausea, vomiting and diarrhea.  Genitourinary: Positive for difficulty urinating. Negative for flank pain.  All other systems reviewed and are negative.    Allergies  Review of patient's allergies indicates no known allergies.  Home Medications   Current Outpatient Rx  Name  Route  Sig  Dispense  Refill  . amitriptyline (ELAVIL) 75 MG tablet   Oral   Take 150 mg by mouth at bedtime.         Marland Kitchen BACLOFEN IT   Intrathecal   by Intrathecal route.         . benazepril (LOTENSIN) 20 MG tablet   Oral   Take 20 mg by mouth daily.         . diazepam (VALIUM) 5 MG tablet   Oral   Take 5 mg by mouth at bedtime as needed for sleep.         Marland Kitchen enoxaparin (LOVENOX) 100 MG/ML injection  Subcutaneous   Inject 1 mL (100 mg total) into the skin every 12 (twelve) hours.   2 Syringe   0   . gabapentin (NEURONTIN) 800 MG tablet   Oral   Take 800 mg by mouth 3 (three) times daily.         . L-ARGININE PO   Oral   Take by mouth.         . metoprolol succinate (TOPROL-XL) 50 MG 24 hr tablet   Oral   Take 50 mg by mouth daily. Take with or immediately following a meal.         . omeprazole (PRILOSEC) 20 MG capsule   Oral   Take 40 mg by mouth daily.         Marland Kitchen. warfarin (COUMADIN) 5 MG tablet   Oral   Take 1 tablet (5 mg total) by mouth daily.   20 tablet   0    BP 165/102  Pulse 116  Temp(Src) 98.1 F (36.7 C) (Oral)  Resp 20  Ht 6' (1.829 m)  Wt 210 lb (95.255  kg)  BMI 28.47 kg/m2  SpO2 96% Physical Exam  Nursing note and vitals reviewed. Constitutional: He is oriented to person, place, and time. He appears well-developed and well-nourished. No distress.  HENT:  Head: Normocephalic.  Cardiovascular: Normal rate and regular rhythm.   Pulmonary/Chest: Effort normal and breath sounds normal. No respiratory distress. He has no wheezes. He has no rales.  Abdominal: Soft. There is tenderness. There is no rebound and no guarding.  There is slight suprapubic distention and tenderness. Patient has implantable pain device under the skin of abdomen. No peritoneal signs.  Neurological: He is alert and oriented to person, place, and time.  Skin: Skin is warm.  Psychiatric: He has a normal mood and affect. His behavior is normal.    ED Course  Procedures   DIAGNOSTIC STUDIES: Oxygen Saturation is 96% on room air.    COORDINATION OF CARE:  Nursing notes reviewed. Vital signs reviewed. Initial pt interview and examination performed.   1:14 AM-patient seen and evaluated. Patient appears in some discomfort. Discussed work up plan with pt at bedside, which includes UA with treatment with Foley catheter. Pt agrees with plan.  Bladder scanner shows over 700 cc.  Patient having immediate relief of symptoms after Foley catheter placed.  Results for orders placed during the hospital encounter of 08/30/13  URINALYSIS, ROUTINE W REFLEX MICROSCOPIC      Result Value Range   Color, Urine YELLOW  YELLOW   APPearance CLOUDY (*) CLEAR   Specific Gravity, Urine 1.010  1.005 - 1.030   pH 6.0  5.0 - 8.0   Glucose, UA NEGATIVE  NEGATIVE mg/dL   Hgb urine dipstick LARGE (*) NEGATIVE   Bilirubin Urine NEGATIVE  NEGATIVE   Ketones, ur NEGATIVE  NEGATIVE mg/dL   Protein, ur 30 (*) NEGATIVE mg/dL   Urobilinogen, UA 1.0  0.0 - 1.0 mg/dL   Nitrite NEGATIVE  NEGATIVE   Leukocytes, UA LARGE (*) NEGATIVE  URINE MICROSCOPIC-ADD ON      Result Value Range   Squamous  Epithelial / LPF RARE  RARE   WBC, UA 21-50  <3 WBC/hpf   RBC / HPF 11-20  <3 RBC/hpf   Bacteria, UA FEW (*) RARE      MDM   1. UTI (lower urinary tract infection)   2. Urinary retention        Angus Sellereter S Jhalil Silvera, PA-C 08/30/13 [phone]

## 2013-09-01 LAB — URINE CULTURE: Colony Count: >=100000

## 2013-09-02 NOTE — ED Notes (Signed)
+   Urine Patient treated with Ciprofloxacin-sensitive to same-chart appended per protocol MD.

## 2013-10-25 ENCOUNTER — Other Ambulatory Visit: Payer: Self-pay | Admitting: Neurology

## 2013-10-28 ENCOUNTER — Other Ambulatory Visit: Payer: Self-pay | Admitting: Neurology

## 2013-10-28 ENCOUNTER — Telehealth: Payer: Self-pay | Admitting: Neurology

## 2013-10-28 NOTE — Telephone Encounter (Signed)
Patient calling about refill for Gabapentin[place] [place]--I told patient Rx was faxed on 3-25 and had confirmation they received--please send Rx in again --thank you

## 2013-10-28 NOTE — Telephone Encounter (Signed)
I called the pharmacy regarding Gabapentin.  They said the Rx has been ready for pick up since 03/25, but the patient has not been in to get it.  I called the patient.  He says they told him yesterday they did not have it.  (He also goes by Ralph [name], which may cause confusion.)  He will follow up with the pharmacy about this and call us back if needed. Patient inquired about appt.  Advised it appears this message was also sent to Triage regarding appt.

## 2013-10-28 NOTE — Telephone Encounter (Signed)
Pt called to check for his appointment. He has none on file.  He states that he needs to get his pump refilled before 12-01-13.  He also needs a follow-up so that he can also get his prescriptions refilled.  Dr. Anne HahnWillis doesn't have any appointments open.  Please call to advise.  Thank you

## 2013-11-03 NOTE — Telephone Encounter (Signed)
Pt called to check the status of an appointment to get his pump refilled before 12-01-13. He also needs a follow-up in order to get his prescriptions refilled. Dr. Anne HahnWillis has an appointment open at 10:00 am tomorrow, however I did not know if this would be too early for him to get his pump refilled.  Please advise.  Thank you

## 2013-11-03 NOTE — Telephone Encounter (Signed)
Patient called back to confirm appt per Arman Bogusana J

## 2013-11-03 NOTE — Telephone Encounter (Addendum)
            Left Vm message for patient to return call to confirm appt for baclofen pump refill (11/24/13@12 :00),is also requesting a medication refill

## 2013-11-04 ENCOUNTER — Ambulatory Visit: Payer: Self-pay | Admitting: Neurology

## 2013-11-24 ENCOUNTER — Encounter: Payer: Self-pay | Admitting: Neurology

## 2013-11-24 ENCOUNTER — Encounter (INDEPENDENT_AMBULATORY_CARE_PROVIDER_SITE_OTHER): Payer: Self-pay

## 2013-11-24 ENCOUNTER — Ambulatory Visit (INDEPENDENT_AMBULATORY_CARE_PROVIDER_SITE_OTHER): Payer: Medicare Other | Admitting: Neurology

## 2013-11-24 VITALS — BP 149/95 | HR 66 | Wt 227.0 lb

## 2013-11-24 DIAGNOSIS — I82409 Acute embolism and thrombosis of unspecified deep veins of unspecified lower extremity: Secondary | ICD-10-CM

## 2013-11-24 DIAGNOSIS — R269 Unspecified abnormalities of gait and mobility: Secondary | ICD-10-CM

## 2013-11-24 DIAGNOSIS — G8389 Other specified paralytic syndromes: Secondary | ICD-10-CM

## 2013-11-24 DIAGNOSIS — G8250 Quadriplegia, unspecified: Secondary | ICD-10-CM

## 2013-11-24 MED ORDER — ESZOPICLONE 3 MG PO TABS: 3.0000 mg | ORAL_TABLET | Freq: Every day | ORAL | Status: DC

## 2013-11-24 MED ORDER — DIAZEPAM 5 MG PO TABS: 5.0000 mg | ORAL_TABLET | Freq: Every evening | ORAL | Status: DC | PRN

## 2013-11-24 MED ORDER — AMITRIPTYLINE HCL 75 MG PO TABS: 150.0000 mg | ORAL_TABLET | Freq: Every day | ORAL | Status: DC

## 2013-11-24 MED ORDER — GABAPENTIN 800 MG PO TABS: 800.0000 mg | ORAL_TABLET | Freq: Three times a day (TID) | ORAL | Status: DC

## 2013-11-24 NOTE — Progress Notes (Signed)
Ralph [name] is a 48 year old gentleman with a history of a spastic quadriparesis associated with a prior spinal cord injury. The patient is a baclofen pump in place, and he comes in for a baclofen pump refill. 2 weeks ago, he indicates that he fell, and he has had some increased back pain. The patient had lumbosacral spine surgery in October of 2015, and he has done well with his back pain since that time. The patient currently is having some pain radiating into the right hip area. He plans ongoing back to his neurosurgeon for x-rays of his back.  Baclofen pump was refilled, reprogrammed.  The next alarm date is 05/10/2014. A visit will be scheduled for a baclofen, refill prior to this date.  Refills were given for his amitriptyline, diazepam, and gabapentin. The patient will be given a trial on Lunesta for sleep.
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
## 2013-11-28 ENCOUNTER — Other Ambulatory Visit (INDEPENDENT_AMBULATORY_CARE_PROVIDER_SITE_OTHER): Payer: Self-pay

## 2013-11-28 DIAGNOSIS — Z0289 Encounter for other administrative examinations: Secondary | ICD-10-CM

## 2013-12-02 LAB — CARDIOLIPIN ANTIBODY
Anticardiolipin IgA: <9 APL U/mL (ref 0–11)
Anticardiolipin IgG: <9 GPL U/mL (ref 0–14)
Anticardiolipin IgM: <9 MPL U/mL (ref 0–12)

## 2013-12-02 LAB — LUPUS ANTICOAGULANT
DPT CONFIRM RATIO: 0.89 ratio (ref 0.00–1.20)
DRVVT: 31.6 s (ref 0.0–55.1)
PTT LA: 36.1 s (ref 0.0–50.0)
THROMBIN TIME: 17 s (ref 0.0–20.0)
dPT: 38.7 s (ref 0.0–55.0)

## 2013-12-02 LAB — FACTOR 5 LEIDEN

## 2013-12-02 NOTE — Progress Notes (Signed)
Quick Note:  I called and LMVM for pt that labs normal. He is to call back with questions. ______

## 2013-12-13 ENCOUNTER — Encounter: Payer: Self-pay | Admitting: Neurology

## 2013-12-13 ENCOUNTER — Telehealth: Payer: Self-pay | Admitting: Neurology

## 2013-12-13 NOTE — Telephone Encounter (Signed)
Left message for patient about rescheduling 05/08/14 appointment per Dr. Anne HahnWillis schedule, printed and mailed letter with new appointment time.

## 2014-02-03 ENCOUNTER — Other Ambulatory Visit: Payer: Self-pay | Admitting: Neurology

## 2014-02-06 NOTE — Telephone Encounter (Signed)
Dr Willis is out of the office, forwarding request to [facility] for approval  

## 2014-02-06 NOTE — Telephone Encounter (Signed)
Rx signed and faxed.

## 2014-03-07 ENCOUNTER — Other Ambulatory Visit: Payer: Self-pay | Admitting: Neurology

## 2014-03-08 ENCOUNTER — Other Ambulatory Visit: Payer: Self-pay

## 2014-03-08 MED ORDER — ESZOPICLONE 3 MG PO TABS: ORAL_TABLET | ORAL | Status: DC

## 2014-03-08 NOTE — Telephone Encounter (Signed)
Dr Willis is out of the office, forwarding request to [facility] for approval  

## 2014-03-08 NOTE — Telephone Encounter (Signed)
Rx signed and faxed.

## 2014-05-08 ENCOUNTER — Encounter: Payer: Medicare Other | Admitting: Neurology

## 2014-05-09 ENCOUNTER — Encounter: Payer: Self-pay | Admitting: Neurology

## 2014-05-09 ENCOUNTER — Ambulatory Visit (INDEPENDENT_AMBULATORY_CARE_PROVIDER_SITE_OTHER): Payer: Medicare Other | Admitting: Neurology

## 2014-05-09 ENCOUNTER — Encounter (INDEPENDENT_AMBULATORY_CARE_PROVIDER_SITE_OTHER): Payer: Self-pay

## 2014-05-09 VITALS — BP 151/95 | HR 63 | Wt 230.0 lb

## 2014-05-09 DIAGNOSIS — G8250 Quadriplegia, unspecified: Secondary | ICD-10-CM

## 2014-05-09 DIAGNOSIS — R269 Unspecified abnormalities of gait and mobility: Secondary | ICD-10-CM

## 2014-05-09 DIAGNOSIS — G8389 Other specified paralytic syndromes: Secondary | ICD-10-CM

## 2014-05-09 NOTE — Procedures (Signed)
     History:  Lenis NoonDarryl Ocain is a 48 year old gentleman with a history of a cervical myelopathy with a spastic quadriparesis. The patient returns to the office today for a baclofen pump refill. He reports some issues with spasticity early in the morning. Otherwise, he is doing well.   Baclofen pump refill note  The baclofen pump site was cleaned with Betadine solution. A 21-gauge needle was inserted into the pump port site. Approximately 3.0 cc of residual baclofen was removed. 20 cc of replacement baclofen was placed into the pump at 2000 mcg/cc concentration.  The pump was reprogrammed for the following settings:  The pump settings are at a simple continuous rate, and the rate was increased from 215.1 mcg per day to a rate of 225.2 mcg per day. This represents a 5% increase in the daily baclofen pump rate.  The alarm volume is set at 2.0 cc. The next alarm date is 10/15/2014.  The patient tolerated the procedure well. There were no complications of the above procedure.

## 2014-07-14 ENCOUNTER — Other Ambulatory Visit: Payer: Self-pay

## 2014-07-14 MED ORDER — ESZOPICLONE 3 MG PO TABS: ORAL_TABLET | ORAL | Status: DC

## 2014-09-21 ENCOUNTER — Other Ambulatory Visit: Payer: Self-pay

## 2014-09-21 MED ORDER — AMITRIPTYLINE HCL 75 MG PO TABS: 150.0000 mg | ORAL_TABLET | Freq: Every day | ORAL | Status: DC

## 2014-09-21 MED ORDER — GABAPENTIN 800 MG PO TABS: 800.0000 mg | ORAL_TABLET | Freq: Three times a day (TID) | ORAL | Status: DC

## 2014-09-21 NOTE — Telephone Encounter (Signed)
Prescribed 4/23

## 2014-09-28 ENCOUNTER — Ambulatory Visit (INDEPENDENT_AMBULATORY_CARE_PROVIDER_SITE_OTHER): Payer: Medicare PPO | Admitting: Neurology

## 2014-09-28 ENCOUNTER — Encounter: Payer: Self-pay | Admitting: Neurology

## 2014-09-28 VITALS — BP 141/91 | HR 66

## 2014-09-28 DIAGNOSIS — G8389 Other specified paralytic syndromes: Secondary | ICD-10-CM

## 2014-09-28 DIAGNOSIS — G8250 Quadriplegia, unspecified: Secondary | ICD-10-CM

## 2014-09-28 DIAGNOSIS — G8222 Paraplegia, incomplete: Secondary | ICD-10-CM

## 2014-09-28 MED ORDER — BACLOFEN 20000 MCG/20ML IT SOLN
20000.0000 ug | Freq: Once | INTRATHECAL | Status: AC
  Administered 2014-09-28: 20000 ug via INTRATHECAL

## 2014-09-28 NOTE — Procedures (Signed)
     History:  Ralph BibleDarrell [name] is a [age] year old gentleman with a history of a spastic paraparesis. He has baclofen pump in place, and he has recently noted some increased stiffness in the legs in the morning time. He will fall on occasion in part secondary to this. He returns for a baclofen pump refill.  Baclofen pump refill note  The baclofen pump site was cleaned with Betadine solution. A 21-gauge needle was inserted into the pump port site. Approximately 5 cc of residual baclofen was removed. The pump indicates that 4 mL was present in the pump. 20 cc of replacement baclofen was placed into the pump at 2000 mcg/cc concentration.  The pump was reprogrammed for the following settings: from 225.2 mcg/day simple continuous rate to 235.1 mcg/day simple continuous rate, a 4% increase in rate.  The alarm volume is set at 2.0 cc. The next alarm date is 02/28/15.  The patient tolerated the procedure well. There were no complications of the above procedure.  The NDC number is [id number] The baclofen expiration date is 5/18. The baclofen lot number is [id number].

## 2014-09-28 NOTE — Progress Notes (Signed)
Please refer to back and pump refill procedure note.

## 2014-11-30 DIAGNOSIS — H47213 Primary optic atrophy, bilateral: Secondary | ICD-10-CM | POA: Diagnosis not present

## 2015-01-24 DIAGNOSIS — Z0289 Encounter for other administrative examinations: Secondary | ICD-10-CM

## 2015-02-15 ENCOUNTER — Ambulatory Visit (INDEPENDENT_AMBULATORY_CARE_PROVIDER_SITE_OTHER): Payer: Medicare PPO | Admitting: Neurology

## 2015-02-15 ENCOUNTER — Encounter: Payer: Self-pay | Admitting: Neurology

## 2015-02-15 VITALS — BP 151/87 | HR 74

## 2015-02-15 DIAGNOSIS — G8250 Quadriplegia, unspecified: Secondary | ICD-10-CM

## 2015-02-15 DIAGNOSIS — G8222 Paraplegia, incomplete: Secondary | ICD-10-CM

## 2015-02-15 DIAGNOSIS — R269 Unspecified abnormalities of gait and mobility: Secondary | ICD-10-CM

## 2015-02-15 DIAGNOSIS — G8389 Other specified paralytic syndromes: Secondary | ICD-10-CM

## 2015-02-15 MED ORDER — BACLOFEN 40 MG/20ML IT SOLN
40.0000 mg | Freq: Once | INTRATHECAL | Status: AC
  Administered 2015-02-15: 40 mg via INTRATHECAL

## 2015-02-15 NOTE — Procedures (Signed)
     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 The patient has noted some gradual increase in his spasticity involving the legs, particularly early in the morning and later in the evening. He reports no recent falls, he walks with a cane. He comes in today for a baclofen pump refill.  Baclofen pump refill note  The baclofen pump site was cleaned with Betadine solution. A 21-gauge needle was inserted into the pump port site. Approximately 4 cc of residual baclofen was removed, the pump indicates that it has a 3.6 mL residual. 20 cc of replacement baclofen was placed into the pump at 2000 mcg/cc concentration.  The pump was reprogrammed for the following settings: The simple continuous rate was increased from 235.1 g daily to a rate of 244.7 g a day, representing a 4% increase.  The alarm volume is set at 2.0 mL. The next alarm date is 07/12/2015.  The patient tolerated the procedure well. There were no complications of the above procedure.  The NDC number is [id number] The baclofen expiration date is September 2018. The baclofen lot number is [id number].

## 2015-02-16 ENCOUNTER — Telehealth: Payer: Self-pay | Admitting: Neurology

## 2015-02-16 NOTE — Telephone Encounter (Signed)
Appointment scheduled 12/2 at 12 PM.

## 2015-02-16 NOTE — Telephone Encounter (Signed)
Patient called requesting to be scheduled for baclofen pump refill. Per Talbert ForestShirley it to be scheduled by RN. He can be reached at [phone].

## 2015-02-22 ENCOUNTER — Other Ambulatory Visit: Payer: Self-pay | Admitting: Neurology

## 2015-02-23 NOTE — Telephone Encounter (Signed)
Rx signed and faxed.

## 2015-03-09 ENCOUNTER — Other Ambulatory Visit: Payer: Self-pay | Admitting: Neurology

## 2015-03-09 NOTE — Telephone Encounter (Signed)
Origibally prescribed 04/23

## 2015-03-22 ENCOUNTER — Other Ambulatory Visit: Payer: Self-pay | Admitting: Neurology

## 2015-04-02 DIAGNOSIS — L723 Sebaceous cyst: Secondary | ICD-10-CM | POA: Diagnosis not present

## 2015-04-30 DIAGNOSIS — I10 Essential (primary) hypertension: Secondary | ICD-10-CM | POA: Diagnosis not present

## 2015-05-04 DIAGNOSIS — N503 Cyst of epididymis: Secondary | ICD-10-CM | POA: Diagnosis not present

## 2015-05-04 DIAGNOSIS — N433 Hydrocele, unspecified: Secondary | ICD-10-CM | POA: Diagnosis not present

## 2015-05-04 DIAGNOSIS — N508 Other specified disorders of male genital organs: Secondary | ICD-10-CM | POA: Diagnosis not present

## 2015-05-25 DIAGNOSIS — N433 Hydrocele, unspecified: Secondary | ICD-10-CM | POA: Diagnosis not present

## 2015-05-26 DIAGNOSIS — Z981 Arthrodesis status: Secondary | ICD-10-CM | POA: Diagnosis not present

## 2015-05-26 DIAGNOSIS — S20229A Contusion of unspecified back wall of thorax, initial encounter: Secondary | ICD-10-CM | POA: Diagnosis not present

## 2015-05-26 DIAGNOSIS — M47816 Spondylosis without myelopathy or radiculopathy, lumbar region: Secondary | ICD-10-CM | POA: Diagnosis not present

## 2015-05-26 DIAGNOSIS — M549 Dorsalgia, unspecified: Secondary | ICD-10-CM | POA: Diagnosis not present

## 2015-05-26 DIAGNOSIS — I10 Essential (primary) hypertension: Secondary | ICD-10-CM | POA: Diagnosis not present

## 2015-05-29 DIAGNOSIS — I10 Essential (primary) hypertension: Secondary | ICD-10-CM | POA: Diagnosis not present

## 2015-06-13 DIAGNOSIS — N433 Hydrocele, unspecified: Secondary | ICD-10-CM | POA: Diagnosis not present

## 2015-06-21 DIAGNOSIS — I10 Essential (primary) hypertension: Secondary | ICD-10-CM | POA: Diagnosis not present

## 2015-07-03 ENCOUNTER — Ambulatory Visit (INDEPENDENT_AMBULATORY_CARE_PROVIDER_SITE_OTHER): Payer: Medicare PPO | Admitting: Neurology

## 2015-07-03 ENCOUNTER — Encounter: Payer: Self-pay | Admitting: Neurology

## 2015-07-03 VITALS — BP 158/106 | HR 70

## 2015-07-03 DIAGNOSIS — Z9989 Dependence on other enabling machines and devices: Principal | ICD-10-CM

## 2015-07-03 DIAGNOSIS — R269 Unspecified abnormalities of gait and mobility: Secondary | ICD-10-CM

## 2015-07-03 DIAGNOSIS — G4733 Obstructive sleep apnea (adult) (pediatric): Secondary | ICD-10-CM | POA: Insufficient documentation

## 2015-07-03 DIAGNOSIS — G8222 Paraplegia, incomplete: Secondary | ICD-10-CM | POA: Diagnosis not present

## 2015-07-03 DIAGNOSIS — G8250 Quadriplegia, unspecified: Secondary | ICD-10-CM

## 2015-07-03 DIAGNOSIS — G8389 Other specified paralytic syndromes: Secondary | ICD-10-CM | POA: Diagnosis not present

## 2015-07-03 MED ORDER — BACLOFEN 40000 MCG/20ML IT SOLN
40000.0000 ug | Freq: Once | INTRATHECAL | Status: AC
  Administered 2015-07-03: 40000 ug via INTRATHECAL

## 2015-07-03 NOTE — Procedures (Signed)
     History:  Ralph NoonDarryl [name] is a 49 year old gentleman with a history of A. Spastic quadriparesis. The patient has an associated gait disorder. He comes in for a baclofen pump refill. He indicates that spasticity is worse in the evening hours, he generally will go to bed around 8 PM, wake up around 4 AM. He wishes to have a baclofen rate increase during this period of time.  Baclofen pump refill note  The baclofen pump site was cleaned with Betadine solution. A 21-gauge needle was inserted into the pump port site. Approximately 4 cc of residual baclofen was removed, the pump indicates a 3.1 cc residual. 20 cc of replacement baclofen was placed into the pump at 2000 mcg/cc concentration.  The pump was reprogrammed for the following settings: The patient was set for a basal rate of 10.2 mcg per hour, from 9 PM to 12 midnight, the patient will have an increase in the basal rate to 11.5 mcg per hour for a total of 34.5 mcg during this period of time. The total daily dose will go from 244.7 mcg per day to a dose of 248.7 mcg per day.  The alarm volume is set at 2.0 cc. The next alarm date is November 24, 2015.  The patient tolerated the procedure well. There were no complications of the above procedure.  The NDC number is [id number] The baclofen expiration date is 03/19. The baclofen lot number is [id number].

## 2015-07-06 ENCOUNTER — Encounter: Payer: Self-pay | Admitting: Neurology

## 2015-07-06 ENCOUNTER — Other Ambulatory Visit: Payer: Self-pay | Admitting: Urology

## 2015-07-19 ENCOUNTER — Telehealth: Payer: Self-pay

## 2015-07-19 NOTE — Telephone Encounter (Addendum)
He was last seen at [facility] in [place] and it might have been 2012 the last time.He is not sure who it is that he gets cpap and supplies from. He had someone come out a long time ago and he still has the same cpap and tubing. He suggest calling them because he is not sure. [phone]

## 2015-07-19 NOTE — Telephone Encounter (Signed)
Called pt to ask when and where he had his last sleep study, and who provides cpap and cpap supplies, (which medical equipment company)? It appears pt may already have a sleep neurologist at [facility] in [place].  If pt calls back, please ask him these questions.

## 2015-07-24 ENCOUNTER — Institutional Professional Consult (permissible substitution): Payer: Medicare PPO | Admitting: Neurology

## 2015-07-24 ENCOUNTER — Telehealth: Payer: Self-pay

## 2015-07-24 NOTE — Telephone Encounter (Signed)
PT called the sleep lab and alerted them that he will not be able to make his appt at 10:00 today. He r/s for 12/28. This counts as a no show per office policy.

## 2015-07-24 NOTE — Telephone Encounter (Signed)
I called [facility] (they are now part of [facility] now). They will not release pt's sleep study without a signed release. Release can be faxed to [phone] Attn: Toniann FailWendy. Will not be able to obtain this release until pt comes in for his appt.

## 2015-07-25 ENCOUNTER — Encounter: Payer: Self-pay | Admitting: Neurology

## 2015-07-25 DIAGNOSIS — N433 Hydrocele, unspecified: Secondary | ICD-10-CM | POA: Diagnosis not present

## 2015-07-31 ENCOUNTER — Encounter (HOSPITAL_BASED_OUTPATIENT_CLINIC_OR_DEPARTMENT_OTHER): Payer: Self-pay | Admitting: *Deleted

## 2015-08-01 ENCOUNTER — Encounter: Payer: Self-pay | Admitting: Neurology

## 2015-08-01 ENCOUNTER — Encounter (HOSPITAL_BASED_OUTPATIENT_CLINIC_OR_DEPARTMENT_OTHER): Payer: Self-pay | Admitting: *Deleted

## 2015-08-01 ENCOUNTER — Ambulatory Visit (INDEPENDENT_AMBULATORY_CARE_PROVIDER_SITE_OTHER): Payer: Medicare PPO | Admitting: Neurology

## 2015-08-01 VITALS — BP 150/90 | HR 78 | Resp 20 | Ht 72.0 in | Wt 226.0 lb

## 2015-08-01 DIAGNOSIS — Z9989 Dependence on other enabling machines and devices: Principal | ICD-10-CM

## 2015-08-01 DIAGNOSIS — I2782 Chronic pulmonary embolism: Secondary | ICD-10-CM

## 2015-08-01 DIAGNOSIS — G4733 Obstructive sleep apnea (adult) (pediatric): Secondary | ICD-10-CM | POA: Diagnosis not present

## 2015-08-01 HISTORY — DX: Chronic pulmonary embolism: I27.82

## 2015-08-01 NOTE — Progress Notes (Signed)
SLEEP MEDICINE CLINIC   Provider:  Melvyn Novasarmen  Osborn [name], M D  Referring Provider: Marylen [name], Ralph [name], Ralph [name] Primary Care Physician:  Ralph [name],Ralph [name], Ralph [name]  Chief Complaint  Patient presents with  . New Patient (Initial Visit)    Dr. Anne HahnWillis referred pt to get started on cpap supplies, has had sleep study done, doesn't know his DME, rm 10, alone    HPI:  Ralph [name] is a 49 y.o. male , seen here as a referral from RalphWillis for a sleep evaluation,  [name] was diagnosed with obstructive sleep apnea at an AHI of 29 during REM sleep and a baseline AHI of only 8.5 in average sleep in a sleep study performed by Dr. Aggie Cosierlark [name]. The study was performed at [facility] on 07/10/2010. It is now over 48 years old. The patient was asked to use CPAP as a result of the sleep study and brought the machine here the CPAP titration was performed on 08/21/2010 it was recommended that he uses 13 cm water pressure on CPAP.  [name] reports that the contact to the durable medical equipment company was never truly established he has not received supplies in years. He has tried to use his CPAP a couple of nights ago and the mask is no longer functioning. He was originally titrated on a nasal mask but later switched for comfort to using a chinstrap. He has no recent documentation of his need for CPAP, and will therefore undergo a new qualifying study. I would like to mention that the patient used to machine of a couple of days and it shows that during the user time his AHI was 8.2 on 13 cm water. This may just reflect a very high air leak.  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 The head in 2014 showed a hemorrhagic lesion in the right parietal cortex. He did not have a previous history of stroke or seizures or TIAs. He was started on warfarin and is followed by Dr. Pearlean BrownieSethi. Which 2 newer anticoagulants after  fluctuating INRs were making it difficult to remain on warfarin. He also has a history of pulmonary embolus and DVT in the right leg. 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 Anne HahnWillis.   Sleep habits are as follows: The patient usually goes to bed by 8 PM, he rises at about 3 AM and usually stays up. The bedroom is cool, quiet and dark. 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 He sleeps about 5-6 hors, waking up every hour. Not sure why. Sleeps on the back, wakes on the back. Back pain is present but doesn't seem to wake him. He reports no nocturia but that he finds himself awake may go to the bathroom. He has experienced sleep paralysis upon awakening. He experiences this mostly not in the bedroom but when he fell asleep on the couch or in a recliner in a certain position. His wife reports him snoring and having apnea.   Sleep medical history and family sleep history: OSA diagnosed in 2008. No family history, adopted, grew up foster care. 2 siblings with learning disability.  Social history:  Married, disabled.   Review of Systems: Out of a complete 14 system review, the patient complains of only the following symptoms, and all other reviewed systems are negative. Snoring, apnea, quadriparesis.   Epworth score 10 , Fatigue severity score 34  ,  depression score 2   Social History   Social History  . Marital Status: Married    Spouse Name: N/A  . Number of Children: N/A  . Years of Education: N/A   Occupational History  . Not on file.   Social History Main Topics  . Smoking status: Never Smoker   . Smokeless tobacco: Current User    Types: Chew  . Alcohol Use: No  . Drug Use: No  . Sexual Activity: Not on file   Other Topics Concern  . Not on file   Social History Narrative    Family History  Problem Relation Age of Onset  . Mental retardation Sister     Past Medical History  Diagnosis Date  . Other quadriplegia and quadriparesis   .  Gait disorder   . Gastroesophageal reflux disease   . Lumbago   . Leg fracture, right   . Spinal cord injury, C5-C7 (HCC)   . History of MRSA infection     wounds in Fingers and then on leg  . Hypoglycemia   . Complication of anesthesia   . PONV (postoperative nausea and vomiting)   . DVT (deep venous thrombosis) (HCC)     right leg  . Pulmonary embolus (HCC)   . Hypertension   . OSA on CPAP     Past Surgical History  Procedure Laterality Date  . Right anterior cruciate ligament repair    . Baclofen pump placement    . Transforaminal lumbar interbody fusion (tlif) with pedicle screw fixation 1 level Left 06/02/2013    Procedure: TRANSFORAMINAL LUMBAR INTERBODY FUSION (TLIF) WITH PEDICLE SCREW FIXATION 1 LEVEL;  Surgeon: Ralph [name], Ralph [name];  Location: [facility] OR;  Service: Orthopedics;  Laterality: Left;  Left sided lumbar 4-5 transforaminal lumbar interbody fusion with instrumentation, vitoss, bone marrow aspirate.    Current Outpatient Prescriptions  Medication Sig Dispense Refill  . amitriptyline (ELAVIL) 75 MG tablet TAKE 2 TABLETS AT BEDTIME 180 tablet 4  . amLODipine (NORVASC) 5 MG tablet Take 5 mg by mouth at bedtime.  3  . BACLOFEN IT by Intrathecal route.    . Eszopiclone 3 MG TABS TAKE 1 TABLET BY MOUTH IMMEDIATELY BEFORE BEDTIME 30 tablet 5  . gabapentin (NEURONTIN) 800 MG tablet TAKE 1 TABLET THREE TIMES DAILY 270 tablet 1  . metoprolol succinate (TOPROL-XL) 50 MG 24 hr tablet Take 50 mg by mouth daily. Take with or immediately following a meal.    . omeprazole (PRILOSEC) 20 MG capsule Take 40 mg by mouth daily.    . diazepam (VALIUM) 5 MG tablet Take 1 tablet (5 mg total) by mouth at bedtime as needed. (Patient not taking: Reported on 08/01/2015) 90 tablet 5   No current facility-administered medications for this visit.    Allergies as of 08/01/2015  . (No Known Allergies)    Vitals: BP 150/90 mmHg  Pulse 78  Resp 20  Ht 6' (1.829 m)  Wt 226 lb (102.513  kg)  BMI 30.64 kg/m2 Last Weight:  Wt Readings from Last 1 Encounters:  08/01/15 226 lb (102.513 kg)   ZOX:WRUE mass index is 30.64 kg/(m^2).     Last Height:   Ht Readings from Last 1 Encounters:  08/01/15 6' (1.829 m)    Physical exam:  General: The patient is awake, alert and appears not in acute distress. The patient is well groomed. Head: Normocephalic, atraumatic. Neck is supple. Mallampati 3  neck circumference: 17 Nasal airflow unrestircted , TMJ not evident .  Retrognathia is seen.  Cardiovascular:  Regular rate and rhythm, without  murmurs or carotid bruit, and without distended neck veins. Respiratory: Lungs are clear to auscultation. Skin:  Without evidence of edema, or rash TNeurologic exam : The patient is awake and alert, oriented to place and time.   Memory subjective  described as intact.  Attention span & concentration ability appears normal.  Speech is fluent,  without  dysarthria, dysphonia or aphasia.  Mood and affect are appropriate.  Cranial nerves: Pupils are equal and briskly reactive to light.Extraocular movements  in vertical and horizontal planes intact and without nystagmus. Visual fields by finger perimetry are intact. Hearing to finger rub intact.   Facial sensation intact to fine touch.  Facial motor strength is symmetric and tongue and uvula move midline. Shoulder shrug was symmetrical.    Coordination: Rapid alternating movements in the fingers/hands was normal. Finger-to-nose maneuver  normal without evidence of ataxia, dysmetria or tremor.  Gait and station: Patient walks without assistive device . Deep tendon reflexes: in the  upper and lower extremities are symmetric and intact. Babinski maneuver response is downgoing.  The patient was advised of the nature of the diagnosed sleep disorder , the treatment options and risks for general a health and wellness arising from not treating the condition.  I spent more than 45 minutes of face to face  time with the patient. Greater than 50% of time was spent in counseling and coordination of care. We have discussed the diagnosis and differential and I answered the patient'[name] questions.     Assessment:  After physical and neurologic examination, review of laboratory studies,  Personal review of imaging studies, reports of other /same  Imaging studies ,  Results of polysomnography/ neurophysiology testing and pre-existing records as far as provided in visit., my assessment is   1) Repeat OSA testing, PSG SPlit .    2) Order a new nasal mask, introduce patient to [facility]. He can use it current machine until sleep study.   RV after SPLIT.   Porfirio Mylar Jadian Karman Ralph [name]  08/01/2015   CC: Ralph Ponto, Ralph [name] [address] [place] [address],

## 2015-08-02 NOTE — Progress Notes (Signed)
SPOKE W/ PT TODAY. STATES HYDROCELE NO LONGER AN ISSUE IT IS RESOLVING.  PT CALLING OR SCHEDULER AT OFFICE TODAY.

## 2015-08-02 NOTE — H&P (Signed)
History of Present Illness   [name] was treated for a positive urinary tract infection. He went into retention with residual 700 mL in the emergency room. In 2001, he had a C-spine injury and was self catheterizing for 3 months. May have had a DVT. He has had a pulmonary embolism and he is on Lovenox and Coumadin. He had enterococcus in his urine treated with Levaquin. Today he is here for a screening renal ultrasound. Right kidney was 7.25 cm x 4.86 cm x 6.25 cm. Left kidney was 12.19 cm x 5.57 cm x 6. 05 cm. Renal cortex was normal. There is 250 mL in his bladder. There is no hydronephrosis. Some of the calices were a little bit full.    When I saw [name] in October 2016, he was voiding spontaneously and he had noted a left-sided hydrocele. I called in Mobic and he is here in followup. He had an ultrasound on May 01, 2015, and he had a hydrocele on the left and normal testicles and epididymis bilaterally.   Frequency: Stable.    Past Medical History Problems  1. History of arthritis (Z87.39) 2. History of deep vein thrombophlebitis of lower extremity (Z86.72) 3. History of gastroesophageal reflux (GERD) (Z87.19) 4. History of hypertension (Z86.79) 5. History of pulmonary embolism (Z86.711) 6. History of Obstructive sleep apnea, adult (G47.33)  Surgical History Problems  1. History of Back Surgery 2. History of Gastric Surgery 3. History of Leg Repair  Current Meds 1. Amitriptyline HCl TABS;  Therapy: (Recorded:30Jan2015) to Recorded 2. Benazepril HCl - 20 MG Oral Tablet;  Therapy: (Recorded:30Jan2015) to Recorded 3. Coumadin TABS;  Therapy: (Recorded:30Jan2015) to Recorded 4. Gabapentin 800 MG Oral Tablet;  Therapy: (Recorded:30Jan2015) to Recorded 5. Levofloxacin 500 MG Oral Tablet; TAKE 1 TABLET DAILY;  Therapy: 30Jan2015 to (Evaluate:06Feb2015)  Requested for: 30Jan2015; Last  Rx:30Jan2015 Ordered 6. Meloxicam 15 MG Oral Tablet; TAKE 1 TABLET DAILY WITH  FOOD;  Therapy: 21Oct2016 to (Evaluate:20Dec2016)  Requested for: 21Oct2016; Last  Rx:21Oct2016 Ordered 7. Metoprolol Tartrate 50 MG Oral Tablet;  Therapy: (Recorded:30Jan2015) to Recorded 8. Omeprazole 20 MG Oral Tablet Delayed Release;  Therapy: (Recorded:30Jan2015) to Recorded  Allergies Medication  1. No Known Drug Allergies  Social History Problems  1. Denied: History of Alcohol use 2. Caffeine use (F15.90)   one drink daily 3. Married 4. Non-smoker (Z78.9) 5. Number of children   2 daughters 6. Occupation   disabled  Vitals Vital Signs [Data Includes: Last 1 Day]  Recorded: 09Nov2016 09:11AM  Blood Pressure: 156 / 108 Temperature: 97.8 F Heart Rate: 77  Assessment Assessed  1. Hydrocele (N43.3)  Plan Hydrocele  1. Follow-up Week x 6 Office  Follow-up  Status: Canceled 2. Follow-up Week x 6 Office  Follow-up  Status: Hold For - Date of Service  Requested for:  21Dec2016  Discussion/Summary   [name] has had Mobic for about 3 weeks. He says things are basically unchanged. He wanted to review things again and he was concerned that the hydrocele could bust. I talked to him about this.   I talked to him about a hydrocele surgery with pros, cons, and risks also including the risk of infection with sequelae, bleeding with sequelae, pain, recurrence rates, and swelling postoperatively.   I am going to see him in about 6 weeks after 2 months of antiinflammatories. We will proceed accordingly.    After a thorough review of the management options for the patient's condition the patient  elected to proceed with  surgical therapy as noted above. We have discussed the potential benefits and risks of the procedure, side effects of the proposed treatment, the likelihood of the patient achieving the goals of the procedure, and any potential problems that might occur during the procedure or recuperation. Informed consent has been obtained.

## 2015-08-07 ENCOUNTER — Ambulatory Visit (HOSPITAL_BASED_OUTPATIENT_CLINIC_OR_DEPARTMENT_OTHER): Admission: RE | Admit: 2015-08-07 | Payer: Medicare PPO | Source: Ambulatory Visit | Admitting: Urology

## 2015-08-07 ENCOUNTER — Encounter (HOSPITAL_BASED_OUTPATIENT_CLINIC_OR_DEPARTMENT_OTHER): Admission: RE | Payer: Self-pay | Source: Ambulatory Visit

## 2015-08-07 HISTORY — DX: Quadriplegia, unspecified: G82.50

## 2015-08-07 SURGERY — HYDROCELECTOMY
Anesthesia: General | Laterality: Left

## 2015-08-17 ENCOUNTER — Telehealth: Payer: Self-pay | Admitting: Neurology

## 2015-08-17 ENCOUNTER — Telehealth: Payer: Self-pay

## 2015-08-17 ENCOUNTER — Other Ambulatory Visit: Payer: Self-pay | Admitting: Neurology

## 2015-08-17 NOTE — Telephone Encounter (Signed)
I called the patient. The mail-order has gone up on the tear of the amitriptyline. The patient will call a local pharmacy, if the price remains high, we will switch to nortriptyline. Amitriptyline his generic, should be quite inexpensive.

## 2015-08-17 NOTE — Telephone Encounter (Signed)
Pt called said amitriptyline (ELAVIL) 75 MG tablet has went from tier 1 to tier 3 and the cost is $131. Pharmacy [facility] said nortriptyline was a similar drug and maybe the provider could switch him-copay would be between $20 to $35. Pt is wanting what Dr Anne HahnWillis would advise.

## 2015-08-17 NOTE — Telephone Encounter (Signed)
I spoke to patient and let him know that his Ralph PattenLunesta Rx was ready. He asked if I could fax it in to his [facility] pharmacy. I have faxed and will call him back if any problem.

## 2015-08-28 DIAGNOSIS — N433 Hydrocele, unspecified: Secondary | ICD-10-CM | POA: Diagnosis not present

## 2015-08-31 ENCOUNTER — Other Ambulatory Visit: Payer: Self-pay | Admitting: Urology

## 2015-09-25 ENCOUNTER — Ambulatory Visit (INDEPENDENT_AMBULATORY_CARE_PROVIDER_SITE_OTHER): Payer: Medicare PPO | Admitting: Neurology

## 2015-09-25 DIAGNOSIS — G4733 Obstructive sleep apnea (adult) (pediatric): Secondary | ICD-10-CM

## 2015-09-25 DIAGNOSIS — Z9989 Dependence on other enabling machines and devices: Principal | ICD-10-CM

## 2015-09-25 DIAGNOSIS — I2782 Chronic pulmonary embolism: Secondary | ICD-10-CM

## 2015-09-25 NOTE — Sleep Study (Signed)
Please see the scanned sleep study interpretation located in the Procedure tab within the Chart Review section. 

## 2015-09-26 ENCOUNTER — Telehealth: Payer: Self-pay | Admitting: Neurology

## 2015-09-26 ENCOUNTER — Encounter (HOSPITAL_BASED_OUTPATIENT_CLINIC_OR_DEPARTMENT_OTHER): Payer: Self-pay | Admitting: *Deleted

## 2015-09-26 DIAGNOSIS — Q211 Atrial septal defect: Secondary | ICD-10-CM

## 2015-09-26 DIAGNOSIS — Q2112 Patent foramen ovale: Secondary | ICD-10-CM

## 2015-09-26 DIAGNOSIS — Z8673 Personal history of transient ischemic attack (TIA), and cerebral infarction without residual deficits: Secondary | ICD-10-CM

## 2015-09-26 NOTE — Telephone Encounter (Signed)
I called the patient. The patient has had a transcranial Doppler bubble study that showed evidence of a large PFO. This was not apparent on the standard 2-D echocardiogram, but apparently the patient will be going in for surgery for a hydrocele. The anesthesiologist once a repeat 2-D echocardiogram. I will get this set up.

## 2015-09-26 NOTE — Progress Notes (Addendum)
NPO AFTER MN.  ARRIVE AT 0600. NEEDS ISTAT AND EKG .   WILL TAKE  AM MEDS W/ EXCEPTION NO DIOVAN/ HCT WITH SIPS OF WATER.   REVIEWED CHART W/ DR ROSE MDA,  STATED PT NEEDS 2D ECHO DONE PRIOR TO DOS .  CALLED AND LM FOR PAM, OR SCHEDULER FOR DR MACDIAMID.

## 2015-09-26 NOTE — Telephone Encounter (Signed)
Patient called to advise he is having outpatient procedure on Tuesday 10/02/15, they called him today regarding pre-op and during questionnaire he told them about previous stroke and at that time was discovered he had a hole in his heart, a bubble test was done by Dr. Pearlean Brownie, pre-op department is now wanting our office to do a 2D ECG prior to surgery.

## 2015-09-27 ENCOUNTER — Telehealth: Payer: Self-pay

## 2015-09-27 DIAGNOSIS — G4731 Primary central sleep apnea: Secondary | ICD-10-CM

## 2015-09-27 DIAGNOSIS — G4733 Obstructive sleep apnea (adult) (pediatric): Secondary | ICD-10-CM

## 2015-09-27 NOTE — Telephone Encounter (Signed)
No answer and no vm. Will try back later.

## 2015-09-27 NOTE — Telephone Encounter (Signed)
Called again, no answer no vm. Called wife's number and no answer and no vm.

## 2015-10-02 ENCOUNTER — Ambulatory Visit (HOSPITAL_BASED_OUTPATIENT_CLINIC_OR_DEPARTMENT_OTHER): Admission: RE | Admit: 2015-10-02 | Payer: Medicare PPO | Source: Ambulatory Visit | Admitting: Urology

## 2015-10-02 HISTORY — DX: Other specified disorders of bladder: N32.89

## 2015-10-02 SURGERY — HYDROCELECTOMY
Anesthesia: General | Laterality: Left

## 2015-10-02 NOTE — Telephone Encounter (Signed)
I discussed pt's sleep study results with him. I advised him that his study revealed complex sleep apnea and CPAP treatment is urgently advised and is the only therapy indicated. Dr. Vickey Huger recommends proceeding with a PAP titration study to optimize therapy. Pt is agreeable to coming in for a cpap titration. I advised him that weight loss and positional therapy might be entertained. Pt verbalized understanding that our sleep lab will call pt to schedule the cpap titration.

## 2015-10-15 ENCOUNTER — Other Ambulatory Visit: Payer: Self-pay | Admitting: Neurology

## 2015-10-15 ENCOUNTER — Telehealth: Payer: Self-pay | Admitting: Neurology

## 2015-10-15 MED ORDER — NORTRIPTYLINE HCL 75 MG PO CAPS: 150.0000 mg | ORAL_CAPSULE | Freq: Every day | ORAL | Status: DC

## 2015-10-15 NOTE — Telephone Encounter (Signed)
That is fine, please fax prescription. thanks

## 2015-10-15 NOTE — Telephone Encounter (Signed)
Pt called said he is wanting to try the nortriptyline, please fax RX to [facility]. Phone # [phone]

## 2015-10-15 NOTE — Telephone Encounter (Signed)
Faxed rx to [facility] per pt request.

## 2015-10-15 NOTE — Telephone Encounter (Signed)
Dr Lucia GaskinsAhern- are you ok to place new order? Or do you want to wait until Dr Anne HahnWillis is back on Wed?

## 2015-10-18 NOTE — Telephone Encounter (Signed)
error 

## 2015-10-19 DIAGNOSIS — I10 Essential (primary) hypertension: Secondary | ICD-10-CM | POA: Diagnosis not present

## 2015-10-19 DIAGNOSIS — N529 Male erectile dysfunction, unspecified: Secondary | ICD-10-CM | POA: Diagnosis not present

## 2015-10-19 DIAGNOSIS — Z0000 Encounter for general adult medical examination without abnormal findings: Secondary | ICD-10-CM | POA: Diagnosis not present

## 2015-10-19 DIAGNOSIS — Z1389 Encounter for screening for other disorder: Secondary | ICD-10-CM | POA: Diagnosis not present

## 2015-10-19 DIAGNOSIS — Z79899 Other long term (current) drug therapy: Secondary | ICD-10-CM | POA: Diagnosis not present

## 2015-10-19 DIAGNOSIS — K219 Gastro-esophageal reflux disease without esophagitis: Secondary | ICD-10-CM | POA: Diagnosis not present

## 2015-10-29 ENCOUNTER — Telehealth: Payer: Self-pay | Admitting: Neurology

## 2015-10-29 ENCOUNTER — Other Ambulatory Visit: Payer: Self-pay

## 2015-10-29 ENCOUNTER — Ambulatory Visit (HOSPITAL_COMMUNITY): Payer: Medicare PPO | Attending: Neurology

## 2015-10-29 ENCOUNTER — Other Ambulatory Visit: Payer: Self-pay | Admitting: Neurology

## 2015-10-29 ENCOUNTER — Ambulatory Visit (INDEPENDENT_AMBULATORY_CARE_PROVIDER_SITE_OTHER): Payer: Medicare PPO | Admitting: Neurology

## 2015-10-29 DIAGNOSIS — G4731 Primary central sleep apnea: Secondary | ICD-10-CM | POA: Diagnosis not present

## 2015-10-29 DIAGNOSIS — G4733 Obstructive sleep apnea (adult) (pediatric): Secondary | ICD-10-CM

## 2015-10-29 DIAGNOSIS — Q211 Atrial septal defect: Secondary | ICD-10-CM | POA: Diagnosis not present

## 2015-10-29 DIAGNOSIS — Z8673 Personal history of transient ischemic attack (TIA), and cerebral infarction without residual deficits: Secondary | ICD-10-CM | POA: Diagnosis not present

## 2015-10-29 DIAGNOSIS — I517 Cardiomegaly: Secondary | ICD-10-CM | POA: Insufficient documentation

## 2015-10-29 DIAGNOSIS — I071 Rheumatic tricuspid insufficiency: Secondary | ICD-10-CM | POA: Insufficient documentation

## 2015-10-29 DIAGNOSIS — I340 Nonrheumatic mitral (valve) insufficiency: Secondary | ICD-10-CM | POA: Diagnosis not present

## 2015-10-29 DIAGNOSIS — I639 Cerebral infarction, unspecified: Secondary | ICD-10-CM | POA: Diagnosis present

## 2015-10-29 DIAGNOSIS — I5189 Other ill-defined heart diseases: Secondary | ICD-10-CM | POA: Insufficient documentation

## 2015-10-29 DIAGNOSIS — Q2112 Patent foramen ovale: Secondary | ICD-10-CM

## 2015-10-29 NOTE — Sleep Study (Signed)
Please see the scanned sleep study interpretation located in the Procedure tab within the Chart Review section. 

## 2015-10-29 NOTE — Telephone Encounter (Addendum)
I called patient. The echocardiogram shows mild diastolic dysfunction, atrial enlargement on the left. No significant abnormalities seen. The patient has a known patent foramen ovale.   2 D echo 10/29/15:  Study Conclusions  - Left ventricle: The cavity size was normal. There was mild focal basal hypertrophy of the septum. Systolic function was normal. The estimated ejection fraction was in the range of 55% to 60%. Wall motion was normal; there were no regional wall motion abnormalities. Doppler parameters are consistent with abnormal left ventricular relaxation (grade 1 diastolic dysfunction). - Left atrium: The atrium was mildly dilated.  Impressions:  - Normal LV systolic function; grade 1 diastolic dysfunction; mild LAE; trace MR and TR.

## 2015-10-30 NOTE — Telephone Encounter (Signed)
I called the patient, discussed the results of the echocardiogram with him. He reports some issues with irregular heartbeat at nighttime when he lies down. He may need to mention that to his primary doctor, could potentially represent atrial fibrillation.

## 2015-10-30 NOTE — Telephone Encounter (Signed)
Pt returned Dr Willis call.  °

## 2015-10-31 NOTE — Addendum Note (Signed)
Addended byHermenia Fiscal: Tarini Carrier on: 10/31/2015 03:49 PM   Modules accepted: Medications

## 2015-11-01 ENCOUNTER — Telehealth: Payer: Self-pay

## 2015-11-01 DIAGNOSIS — G4733 Obstructive sleep apnea (adult) (pediatric): Secondary | ICD-10-CM

## 2015-11-01 NOTE — Telephone Encounter (Signed)
I spoke with pt regarding his sleep study results. I advised him that his cpap titration was a difficult study and an optimal treatment pressure was not identified in this study. Dr. Vickey Hugerohmeier recommends starting an auto PAP with a window of 7-18 cm H2O and a re-evaluation, and if necessary, an attended bipap titration should be performed if symptoms do not resolve or if the apap treatment does not work. Pt is agreeable to trying apap. I advised pt that [facility] and they would call him within a week to get the cpap set up. Pt verbalized understanding. A follow up appt was made for 5/8 at 10:30. Pt verbalized understanding.

## 2015-11-14 DIAGNOSIS — E876 Hypokalemia: Secondary | ICD-10-CM | POA: Diagnosis not present

## 2015-11-14 DIAGNOSIS — M4800 Spinal stenosis, site unspecified: Secondary | ICD-10-CM | POA: Diagnosis not present

## 2015-11-14 DIAGNOSIS — G4733 Obstructive sleep apnea (adult) (pediatric): Secondary | ICD-10-CM | POA: Diagnosis not present

## 2015-11-14 DIAGNOSIS — R269 Unspecified abnormalities of gait and mobility: Secondary | ICD-10-CM | POA: Diagnosis not present

## 2015-11-19 ENCOUNTER — Ambulatory Visit (INDEPENDENT_AMBULATORY_CARE_PROVIDER_SITE_OTHER): Payer: Medicare PPO | Admitting: Neurology

## 2015-11-19 ENCOUNTER — Encounter: Payer: Self-pay | Admitting: Neurology

## 2015-11-19 VITALS — BP 135/87 | HR 67 | Ht 72.0 in | Wt 229.0 lb

## 2015-11-19 DIAGNOSIS — R269 Unspecified abnormalities of gait and mobility: Secondary | ICD-10-CM | POA: Diagnosis not present

## 2015-11-19 DIAGNOSIS — G8250 Quadriplegia, unspecified: Secondary | ICD-10-CM

## 2015-11-19 MED ORDER — BACLOFEN 40000 MCG/20ML IT SOLN
40000.0000 ug | Freq: Once | INTRATHECAL | Status: AC
  Administered 2015-11-19: 40000 ug via INTRATHECAL

## 2015-11-19 NOTE — Procedures (Signed)
     History:  Ralph [name] is a [age] year old gentleman with a history of a myelopathy associated with spasticity of the lower extremities. The patient comes in for a baclofen pump refill. He reports that he has had some increased spasticity primarily when he first gets up in the morning. He does better later in the day. He reports no recent falls.  Baclofen pump refill note  The baclofen pump site was cleaned with Betadine solution. A 21-gauge needle was inserted into the pump port site. Approximately 4 cc of residual baclofen was removed, the pump indicates a 2.8 mL. 20 cc of replacement baclofen was placed into the pump at 2000 mcg/cc concentration.  The pump was reprogrammed for the following settings: The pump was originally at a daily dose of 248.7 g per day, with a basal rate of 10.2 mcg/h, from 9 PM until midnight the patient was getting an increased rate of 11.5 mcg/h for total dose of 34.5 g. This was maintained, but a setting from midnight until 3 AM was done at 11.5 g per hour, total dose of 34.5 g per day. The total daily dose at this point is 252.6 g per day representing a 2% increase in the daily dosing.  The alarm volume is set at 2.0 mL. The next alarm date is 04/09/2016.  The patient tolerated the procedure well. There were no complications of the above procedure.  The NDC number is [id number] The baclofen expiration date is September 2018. The baclofen lot number is [id number].

## 2015-12-10 ENCOUNTER — Ambulatory Visit: Payer: Self-pay | Admitting: Neurology

## 2015-12-10 DIAGNOSIS — E870 Hyperosmolality and hypernatremia: Secondary | ICD-10-CM | POA: Diagnosis not present

## 2015-12-14 DIAGNOSIS — G4733 Obstructive sleep apnea (adult) (pediatric): Secondary | ICD-10-CM | POA: Diagnosis not present

## 2015-12-14 DIAGNOSIS — R269 Unspecified abnormalities of gait and mobility: Secondary | ICD-10-CM | POA: Diagnosis not present

## 2015-12-14 DIAGNOSIS — M4800 Spinal stenosis, site unspecified: Secondary | ICD-10-CM | POA: Diagnosis not present

## 2015-12-15 ENCOUNTER — Other Ambulatory Visit: Payer: Self-pay | Admitting: Neurology

## 2015-12-17 ENCOUNTER — Encounter: Payer: Self-pay | Admitting: Neurology

## 2015-12-17 ENCOUNTER — Ambulatory Visit (INDEPENDENT_AMBULATORY_CARE_PROVIDER_SITE_OTHER): Payer: Medicare PPO | Admitting: Neurology

## 2015-12-17 VITALS — BP 130/88 | HR 86 | Resp 20 | Ht 72.0 in | Wt 228.0 lb

## 2015-12-17 DIAGNOSIS — G4733 Obstructive sleep apnea (adult) (pediatric): Secondary | ICD-10-CM | POA: Diagnosis not present

## 2015-12-17 DIAGNOSIS — G4731 Primary central sleep apnea: Secondary | ICD-10-CM

## 2015-12-17 DIAGNOSIS — R0902 Hypoxemia: Secondary | ICD-10-CM

## 2015-12-17 DIAGNOSIS — Z9989 Dependence on other enabling machines and devices: Secondary | ICD-10-CM

## 2015-12-17 NOTE — Patient Instructions (Signed)
Hypoxemia °Hypoxemia occurs when your blood does not contain enough oxygen. The body cannot work well when it does not have enough oxygen because every part of your body needs oxygen. Oxygen travels to all parts of the body through your blood. Hypoxemia can develop suddenly or can come on slowly. °CAUSES °Some common causes of hypoxemia include: °· Long-term (chronic) lung diseases, such as chronic obstructive pulmonary disease (COPD) or interstitial lung disease.  °· Disorders that affect breathing at night, such as sleep apnea. °· Fluid buildup in your lungs (pulmonary edema).  °· Lung infection (pneumonia). °· Lung or throat cancer. °· Abnormal blood flow that bypasses the lungs (shunt). °· Certain diseases that affect nerves or muscles. °· A collapsed lung (pneumothorax). °· A blood clot in the lungs (pulmonary embolus). °· Certain types of heart disease. °· Slow or shallow breathing (hypoventilation).  °· Certain medicines. °· High altitudes. °· Toxic chemicals and gases. °SIGNS AND SYMPTOMS °Not everyone who has hypoxemia will develop symptoms. If the hypoxemia developed quickly, you will likely have symptoms such as shortness of breath. If the hypoxemia came on slowly over months or years, you may not notice any symptoms. Symptoms can include: °· Shortness of breath (dyspnea). °· Bluish color of the skin, lips, or nail beds. °· Breathing that is fast, noisy, or shallow. °· A fast heartbeat. °· Feeling tired or sleepy. °· Being confused or feeling anxious. °DIAGNOSIS °To determine if you have hypoxemia, your health care provider may perform: °· A physical exam. °· Blood tests. °· A pulse oximetry. A sensor will be put on your finger, toe, or earlobe to measure the percent of oxygen in your blood. °TREATMENT °You will likely be treated with oxygen therapy. Depending on the cause of your hypoxemia, you may need oxygen for a short time (weeks or months), or you may need it indefinitely. Your health care provider  may also recommend other therapies to treat the underlying cause of your hypoxemia. °HOME CARE INSTRUCTIONS °· Only take over-the-counter or prescription medicines as directed by your health care provider. °· Follow oxygen safety measures if you are on oxygen therapy. These may include: °¨ Always having a backup supply of oxygen. °¨ Not allowing anyone to smoke around oxygen. °¨ Handling the oxygen tanks carefully and as instructed. °· If you smoke, quit. Stay away from people who smoke. °· Follow up with your health care provider as directed. °SEEK MEDICAL CARE IF: °· You have any concerns about your oxygen therapy. °· You still have trouble breathing. °· You become short of breath when you exercise. °· You are tired when you wake up. °· You have a headache when you wake up. °SEEK IMMEDIATE MEDICAL CARE IF:  °· Your breathing gets worse. °· You have new shortness of breath with normal activity. °· You have a bluish color of the skin, lips, or nail beds. °· You have confusion or cloudy thinking. °· You cough up dark mucus. °· You have chest pain. °· You have a fever. °MAKE SURE YOU: °· Understand these instructions. °· Will watch your condition. °· Will get help right away if you are not doing well or get worse. °  °This information is not intended to replace advice given to you by your health care provider. Make sure you discuss any questions you have with your health care provider. °  °Document Released: 02/03/2011 Document Revised: 07/26/2013 Document Reviewed: 02/17/2013 °Elsevier Interactive Patient Education ©2016 [employer] Inc. ° °
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
## 2015-12-18 ENCOUNTER — Telehealth: Payer: Self-pay | Admitting: Neurology

## 2015-12-18 DIAGNOSIS — R0902 Hypoxemia: Secondary | ICD-10-CM

## 2015-12-18 NOTE — Telephone Encounter (Signed)
Spoke to pt. He [facility] and [facility] him the [facility]NO on Wednesday night. He just wanted to verify that it is a one night test. I verified that it is indeed only one night, and [facility] will fax the results to Dr. Vickey Hugerohmeier, and then I will call patient. Pt verbalized understanding.

## 2015-12-18 NOTE — Telephone Encounter (Signed)
Ralph [name] [id number] [name] [phone] * [phone] 68 NEEDS TO SPEAK WITH SOMEONE AT THE OFFICE, HAS QUESTIONS ABOUT PREVIOUS DISCUSSION W/DR [name] Y

## 2015-12-20 ENCOUNTER — Telehealth: Payer: Self-pay | Admitting: Neurology

## 2015-12-20 MED ORDER — GABAPENTIN 800 MG PO TABS: 800.0000 mg | ORAL_TABLET | Freq: Three times a day (TID) | ORAL | Status: DC

## 2015-12-20 NOTE — Telephone Encounter (Signed)
error 

## 2015-12-20 NOTE — Telephone Encounter (Signed)
Spoke to [place]gins[place]ica, [employer] who says that requested medication was shipped out yesterday and pt should receive w/i 3-5 days. Short-fill retailed to local pharmacy until pt gets new shipment.

## 2015-12-20 NOTE — Telephone Encounter (Signed)
Pt called said [employer] said he did not have any refills on gabapentin (NEURONTIN) 800 MG tablet . Operator advised him there are 3 refills and it was sent via electronically 12/17/15. He is out of medication and is requesting a 5-7 day refill sent to his local pharmacy: [facility] [place]. He also said he will check back with [employer].

## 2015-12-21 DIAGNOSIS — G4733 Obstructive sleep apnea (adult) (pediatric): Secondary | ICD-10-CM | POA: Diagnosis not present

## 2015-12-21 DIAGNOSIS — M4800 Spinal stenosis, site unspecified: Secondary | ICD-10-CM | POA: Diagnosis not present

## 2015-12-21 DIAGNOSIS — R269 Unspecified abnormalities of gait and mobility: Secondary | ICD-10-CM | POA: Diagnosis not present

## 2016-01-02 NOTE — Telephone Encounter (Signed)
I called pt to discuss results/referral. No answer, left a message asking him to call me back.

## 2016-01-02 NOTE — Telephone Encounter (Signed)
[facility]   SpO2<= 89% for 4:48:32.  SpO2<=88% for 2:13:40  Dr. Vickey Hugerohmeier signed the ONO results and said "low O2". Do you want to order oxygen for this pt or defer to pulmonology with a referral?

## 2016-01-02 NOTE — Telephone Encounter (Signed)
This patient was not on opioids, yes he needs oxygen. Can we get this done through Pulmonology, please.  CC to Dr Anne Hahnwillis.

## 2016-01-02 NOTE — Telephone Encounter (Signed)
Spoke to pt and advised her that his ONO on CPAP still revealed low oxygen and Dr. Vickey Hugerohmeier recommends seeing a pulmonologist to discuss. Usually, insurance does not accept ONO results as a criteria to obtain oxygen and insurances generally want the order for oxygen to come from a pulmonologist. Dr. Vickey Hugerohmeier referred pt to Dr. Delton CoombesByrum. Pt verbalized understanding of results. Pt had no further questions at this time but was encouraged to call back if questions arise.

## 2016-01-02 NOTE — Telephone Encounter (Signed)
We cannot get oxygen prescribed based on ONO.  That's why the in lab sleep study is often essential.  I will need to refer to pulmonology- this patient was on opioids , too

## 2016-01-02 NOTE — Addendum Note (Signed)
Addended by: Geronimo RunningINKINS, Isabel Freese A on: 01/02/2016 09:01 AM   Modules accepted: Orders

## 2016-01-02 NOTE — Telephone Encounter (Signed)
Referral to Dr. Delton CoombesByrum placed. ONO results sent for scanning.

## 2016-01-14 ENCOUNTER — Ambulatory Visit (INDEPENDENT_AMBULATORY_CARE_PROVIDER_SITE_OTHER): Payer: Medicare PPO | Admitting: Adult Health

## 2016-01-14 ENCOUNTER — Encounter: Payer: Self-pay | Admitting: Adult Health

## 2016-01-14 VITALS — BP 137/94 | HR 74 | Ht 72.0 in | Wt 225.4 lb

## 2016-01-14 DIAGNOSIS — G4733 Obstructive sleep apnea (adult) (pediatric): Secondary | ICD-10-CM

## 2016-01-14 DIAGNOSIS — R269 Unspecified abnormalities of gait and mobility: Secondary | ICD-10-CM | POA: Diagnosis not present

## 2016-01-14 DIAGNOSIS — M4800 Spinal stenosis, site unspecified: Secondary | ICD-10-CM | POA: Diagnosis not present

## 2016-01-14 DIAGNOSIS — E876 Hypokalemia: Secondary | ICD-10-CM | POA: Diagnosis not present

## 2016-01-14 DIAGNOSIS — Z9989 Dependence on other enabling machines and devices: Principal | ICD-10-CM

## 2016-01-14 NOTE — Progress Notes (Signed)
I agree with the assessment and plan as directed by NP .The patient is known to me .   Tia Hieronymus, MD  

## 2016-01-14 NOTE — Patient Instructions (Signed)
CPAP download is excellent Continue using nightly Change out supplies every 3 months.
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
## 2016-01-22 DIAGNOSIS — N433 Hydrocele, unspecified: Secondary | ICD-10-CM | POA: Diagnosis not present

## 2016-01-25 DIAGNOSIS — N433 Hydrocele, unspecified: Secondary | ICD-10-CM | POA: Diagnosis not present

## 2016-01-31 ENCOUNTER — Encounter: Payer: Self-pay | Admitting: Emergency Medicine

## 2016-01-31 ENCOUNTER — Ambulatory Visit (INDEPENDENT_AMBULATORY_CARE_PROVIDER_SITE_OTHER): Payer: Medicare PPO | Admitting: Emergency Medicine

## 2016-01-31 VITALS — BP 138/68 | HR 83 | Ht 72.0 in | Wt 222.2 lb

## 2016-01-31 DIAGNOSIS — Z86711 Personal history of pulmonary embolism: Secondary | ICD-10-CM

## 2016-01-31 DIAGNOSIS — R0902 Hypoxemia: Secondary | ICD-10-CM

## 2016-01-31 DIAGNOSIS — I2782 Chronic pulmonary embolism: Secondary | ICD-10-CM

## 2016-01-31 DIAGNOSIS — G4734 Idiopathic sleep related nonobstructive alveolar hypoventilation: Secondary | ICD-10-CM

## 2016-01-31 HISTORY — DX: Idiopathic sleep related nonobstructive alveolar hypoventilation: G47.34
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
## 2016-01-31 NOTE — Patient Instructions (Addendum)
We will arrange for you to have oxygen bled into your CPAP every night. We will start 2.5 L/m. After you have been on this for 2 weeks we will perform a repeat overnight oximetry with the oxygen in place. We will perform a walking oximetry today on room air. We will perform a ventilation perfusion scan We will perform pulmonary function testing I do not see any evidence that you are in atrial fibrillation today. We may decide at some point in the future to refer you for cardiology evaluation to attempt to detect transient atrial fibrillation Depending on all of the results above and your response to the addition of oxygen we may need to discuss with Dr. Vickey Hugerohmeier an alternative to your current CPAP. There are devices which can support both obstructive and central sleep apnea Follow with Dr Delton CoombesByrum in 1 month or next available to discuss the results.

## 2016-01-31 NOTE — Assessment & Plan Note (Signed)
Very interesting case as the patient has several possible factors that could contribute to nocturnal hypoxemia. I believe we need to rule out underlying lung disease or pulmonary vascular disease, daytime hypoxemia. Check ventilation/perfusion scan, chest x-ray, pulmonary function testing, walking oximetry today. Note that he does have a right to left shunt on echocardiogram which could contribute to decreased oxygenation. Also there's been some mention of arrhythmia and in the setting of recent stroke we need to consider atrial fibrillation. He is not in atrial fibrillation today but he may need Holter monitoring or cardiology evaluation at some point.  He may respond to the addition of oxygen bled into his CPAP.  I am suspicious that because he has both central and obstructive sleep apnea that he may need nocturnal ventilation in addition to CPAP. This is especially true if the addition of supplemental oxygen to his existing CPAP does not reverse his hypoxemia. I will check a repeat overnight oximetry after the oxygen is added. If he continues to desaturate then I will speak with Dr. Vickey Hugerohmeier about nocturnal ventilation.

## 2016-02-01 ENCOUNTER — Telehealth: Payer: Self-pay | Admitting: Emergency Medicine

## 2016-02-01 DIAGNOSIS — Z9989 Dependence on other enabling machines and devices: Principal | ICD-10-CM

## 2016-02-01 DIAGNOSIS — I2782 Chronic pulmonary embolism: Secondary | ICD-10-CM

## 2016-02-01 DIAGNOSIS — G4733 Obstructive sleep apnea (adult) (pediatric): Secondary | ICD-10-CM

## 2016-02-01 NOTE — Telephone Encounter (Signed)
[facility] She states that the pt has to have a CPAP titration due to his insurance. I explained to her that I would place the order today. She voiced understanding and had no further questions. Order has been placed. Nothing further needed.

## 2016-02-01 NOTE — Telephone Encounter (Signed)
Called spoke with Melissa. She states that in order for the CPAP titration to be covered it must be documented in RB's note that the pt has a history of chronic resp problem and it must be in the problem list.  I explained to her that I would need to send the message to RB. The pt is scheduled for the titration on 03/11/16. She voiced understanding and had no further questions.    RB please advise

## 2016-02-04 ENCOUNTER — Telehealth: Payer: Self-pay

## 2016-02-04 DIAGNOSIS — R0902 Hypoxemia: Secondary | ICD-10-CM

## 2016-02-04 NOTE — Telephone Encounter (Signed)
[facility] called and wanted to see if we could repeat CPAP titration on this patient to get him approved for oxygen. Insurance will not cover until we show no apnea and low sats. We booked him for 02-14-16. Can you put in order if you approve?

## 2016-02-11 NOTE — Telephone Encounter (Signed)
Order for cpap titration placed.

## 2016-02-12 NOTE — Telephone Encounter (Signed)
RB please advise. Thanks.  

## 2016-02-13 DIAGNOSIS — R269 Unspecified abnormalities of gait and mobility: Secondary | ICD-10-CM | POA: Diagnosis not present

## 2016-02-13 DIAGNOSIS — M4800 Spinal stenosis, site unspecified: Secondary | ICD-10-CM | POA: Diagnosis not present

## 2016-02-13 DIAGNOSIS — G4733 Obstructive sleep apnea (adult) (pediatric): Secondary | ICD-10-CM | POA: Diagnosis not present

## 2016-02-18 ENCOUNTER — Ambulatory Visit (INDEPENDENT_AMBULATORY_CARE_PROVIDER_SITE_OTHER): Payer: Medicare PPO | Admitting: Neurology

## 2016-02-18 DIAGNOSIS — Z9989 Dependence on other enabling machines and devices: Principal | ICD-10-CM

## 2016-02-18 DIAGNOSIS — G4733 Obstructive sleep apnea (adult) (pediatric): Secondary | ICD-10-CM | POA: Diagnosis not present

## 2016-02-18 DIAGNOSIS — R0902 Hypoxemia: Secondary | ICD-10-CM

## 2016-02-19 ENCOUNTER — Ambulatory Visit (HOSPITAL_COMMUNITY): Payer: Medicare PPO

## 2016-02-19 NOTE — Telephone Encounter (Signed)
RB please advise. Thanks.  

## 2016-02-20 ENCOUNTER — Ambulatory Visit (HOSPITAL_COMMUNITY)
Admission: RE | Admit: 2016-02-20 | Discharge: 2016-02-20 | Disposition: A | Discharge status: Home / Self Care | Payer: Medicare PPO | Source: Ambulatory Visit | Attending: Emergency Medicine | Admitting: Emergency Medicine

## 2016-02-20 DIAGNOSIS — Z86711 Personal history of pulmonary embolism: Secondary | ICD-10-CM | POA: Insufficient documentation

## 2016-02-20 DIAGNOSIS — R0902 Hypoxemia: Secondary | ICD-10-CM

## 2016-02-20 DIAGNOSIS — G4734 Idiopathic sleep related nonobstructive alveolar hypoventilation: Secondary | ICD-10-CM

## 2016-02-20 LAB — PULMONARY FUNCTION TEST
DL/VA % pred: 93 %
DL/VA: 4.36 ml/min/mmHg/L
DLCO UNC % PRED: 102 %
DLCO unc: 33.09 ml/min/mmHg
FEF 25-75 PRE: 5.13 L/s
FEF 25-75 Post: 4.81 L/sec
FEF2575-%CHANGE-POST: -6 %
FEF2575-%PRED-PRE: 145 %
FEF2575-%Pred-Post: 136 %
FEV1-%Change-Post: 0 %
FEV1-%PRED-POST: 115 %
FEV1-%Pred-Pre: 115 %
FEV1-POST: 4.56 L
FEV1-PRE: 4.56 L
FEV1FVC-%CHANGE-POST: 0 %
FEV1FVC-%Pred-Pre: 106 %
FEV6-%CHANGE-POST: 0 %
FEV6-%PRED-POST: 110 %
FEV6-%PRED-PRE: 111 %
FEV6-POST: 5.44 L
FEV6-PRE: 5.46 L
FEV6FVC-%Change-Post: 0 %
FEV6FVC-%PRED-POST: 103 %
FEV6FVC-%PRED-PRE: 103 %
FVC-%CHANGE-POST: 0 %
FVC-%Pred-Post: 107 %
FVC-%Pred-Pre: 108 %
FVC-Post: 5.46 L
FVC-Pre: 5.51 L
POST FEV6/FVC RATIO: 100 %
PRE FEV1/FVC RATIO: 83 %
Post FEV1/FVC ratio: 84 %
Pre FEV6/FVC Ratio: 99 %
RV % PRED: 111 %
RV: 2.24 L
TLC % PRED: 125 %
TLC: 8.71 L

## 2016-02-20 MED ORDER — ALBUTEROL SULFATE (2.5 MG/3ML) 0.083% IN NEBU
2.5000 mg | INHALATION_SOLUTION | Freq: Once | RESPIRATORY_TRACT | Status: AC
  Administered 2016-02-20: 2.5 mg via RESPIRATORY_TRACT

## 2016-02-20 MED ORDER — TECHNETIUM TC 99M DIETHYLENETRIAME-PENTAACETIC ACID: 30.1000 | Freq: Once | INTRAVENOUS | Status: DC | PRN

## 2016-02-20 MED ORDER — TECHNETIUM TO 99M ALBUMIN AGGREGATED
4.1000 | Freq: Once | INTRAVENOUS | Status: AC | PRN
  Administered 2016-02-20: 4 via INTRAVENOUS

## 2016-02-21 NOTE — Telephone Encounter (Signed)
Spoke with Melissa and she said that in [facility] study, patient has to have a diagnosis of Asthma, COPD, etc.. She said that she is going to check to see if Chronic PE will cover it and she will call me back.  But she said that Nocturnal Hypoxemia and OSA will not work.  [facility] back [facility].

## 2016-02-21 NOTE — Telephone Encounter (Signed)
A review of pt's problem list reveals that he has chronic PE, nocturnal hypoxemia, OSA on CPAP. Is this not adequate?

## 2016-02-21 NOTE — Telephone Encounter (Signed)
I have just tried to schedule this patient for [facility] CPAP Titration had him scheduled for 02/22/2016. I then call the patient to ask if he can do the CPAP Titration tomorrow night he stated [facility] and you can imagine how small I felt

## 2016-02-21 NOTE — Telephone Encounter (Signed)
Called and left detailed message on voicemail for Ralph [name] to call back.

## 2016-02-21 NOTE — Telephone Encounter (Signed)
Spoke with Efraim KaufmannMelissa, [facility].  Order entered again with [facility].  CPAP Titration must be done within 30 days of last OV which was on 01/31/16.  Need to have done by 02/29/16 or patient will require another OV.  Please EXPEDITE this order.  To [facility]

## 2016-02-22 ENCOUNTER — Ambulatory Visit (HOSPITAL_BASED_OUTPATIENT_CLINIC_OR_DEPARTMENT_OTHER): Payer: Medicare PPO

## 2016-02-22 ENCOUNTER — Telehealth: Payer: Self-pay | Admitting: *Deleted

## 2016-02-22 ENCOUNTER — Other Ambulatory Visit: Payer: Self-pay | Admitting: *Deleted

## 2016-02-22 DIAGNOSIS — G4733 Obstructive sleep apnea (adult) (pediatric): Secondary | ICD-10-CM

## 2016-02-22 DIAGNOSIS — Z9989 Dependence on other enabling machines and devices: Principal | ICD-10-CM

## 2016-02-22 NOTE — Progress Notes (Unsigned)
Order placed for DME cpap supplies.  [facility]

## 2016-02-22 NOTE — Telephone Encounter (Signed)
Pt called by Dr. Vickey Hugerohmeier.  Needs his cpap next week.  His insurance will run out end of July.   [facility] and [facility] and relayed this information to her.   Order and referral with comments, sleep study faxed to her as well and received fax confirmation.

## 2016-02-25 ENCOUNTER — Other Ambulatory Visit: Payer: Self-pay

## 2016-02-25 DIAGNOSIS — I2782 Chronic pulmonary embolism: Secondary | ICD-10-CM

## 2016-02-25 NOTE — Telephone Encounter (Signed)
I also sent order to [facility] and asked them to please process ASAP since pt's insurance is expiring.

## 2016-02-25 NOTE — Telephone Encounter (Signed)
I spoke to pt and advised him that the order for cpap 7-15 cm H2O was sent to [facility]. I advised him that there were some PLMS with associated sleep disruption and treatment can be considered. Pt says that [facility] already called him for a pressure change on his cpap. Pt asked that I fax a copy of his sleep study to Dr. Delton Coombes to review because he may need oxygen orders. Pt verbalized understanding of results.

## 2016-02-26 DIAGNOSIS — G4733 Obstructive sleep apnea (adult) (pediatric): Secondary | ICD-10-CM | POA: Diagnosis not present

## 2016-02-26 DIAGNOSIS — M4800 Spinal stenosis, site unspecified: Secondary | ICD-10-CM | POA: Diagnosis not present

## 2016-02-26 DIAGNOSIS — R269 Unspecified abnormalities of gait and mobility: Secondary | ICD-10-CM | POA: Diagnosis not present

## 2016-03-04 ENCOUNTER — Other Ambulatory Visit (INDEPENDENT_AMBULATORY_CARE_PROVIDER_SITE_OTHER): Payer: Medicare PPO

## 2016-03-04 ENCOUNTER — Encounter: Payer: Self-pay | Admitting: Adult Health

## 2016-03-04 ENCOUNTER — Ambulatory Visit (INDEPENDENT_AMBULATORY_CARE_PROVIDER_SITE_OTHER): Payer: Medicare PPO | Admitting: Adult Health

## 2016-03-04 VITALS — BP 128/84 | HR 70 | Temp 98.1°F | Ht 72.0 in | Wt 225.0 lb

## 2016-03-04 DIAGNOSIS — G4734 Idiopathic sleep related nonobstructive alveolar hypoventilation: Secondary | ICD-10-CM | POA: Diagnosis not present

## 2016-03-04 DIAGNOSIS — R0602 Shortness of breath: Secondary | ICD-10-CM | POA: Diagnosis not present

## 2016-03-04 DIAGNOSIS — G4733 Obstructive sleep apnea (adult) (pediatric): Secondary | ICD-10-CM | POA: Diagnosis not present

## 2016-03-04 DIAGNOSIS — M4800 Spinal stenosis, site unspecified: Secondary | ICD-10-CM | POA: Diagnosis not present

## 2016-03-04 DIAGNOSIS — Z9989 Dependence on other enabling machines and devices: Principal | ICD-10-CM

## 2016-03-04 DIAGNOSIS — I2782 Chronic pulmonary embolism: Secondary | ICD-10-CM | POA: Diagnosis not present

## 2016-03-04 DIAGNOSIS — R269 Unspecified abnormalities of gait and mobility: Secondary | ICD-10-CM | POA: Diagnosis not present

## 2016-03-04 LAB — CBC WITH DIFFERENTIAL/PLATELET
BASOS ABS: 0 10*3/uL (ref 0.0–0.1)
Basophils Relative: 0.5 % (ref 0.0–3.0)
EOS ABS: 0.1 10*3/uL (ref 0.0–0.7)
Eosinophils Relative: 1.8 % (ref 0.0–5.0)
HCT: 42.8 % (ref 39.0–52.0)
Hemoglobin: 14.6 g/dL (ref 13.0–17.0)
LYMPHS ABS: 1.2 10*3/uL (ref 0.7–4.0)
Lymphocytes Relative: 20.8 % (ref 12.0–46.0)
MCHC: 34 g/dL (ref 30.0–36.0)
MCV: 85.2 fl (ref 78.0–100.0)
MONO ABS: 0.5 10*3/uL (ref 0.1–1.0)
Monocytes Relative: 9 % (ref 3.0–12.0)
NEUTROS PCT: 67.9 % (ref 43.0–77.0)
Neutro Abs: 4 10*3/uL (ref 1.4–7.7)
Platelets: 220 10*3/uL (ref 150.0–400.0)
RBC: 5.02 Mil/uL (ref 4.22–5.81)
RDW: 14 % (ref 11.5–15.5)
WBC: 5.9 10*3/uL (ref 4.0–10.5)

## 2016-03-04 LAB — BASIC METABOLIC PANEL
BUN: 10 mg/dL (ref 6–23)
CALCIUM: 9.5 mg/dL (ref 8.4–10.5)
CHLORIDE: 103 meq/L (ref 96–112)
CO2: 33 mEq/L — ABNORMAL HIGH (ref 19–32)
CREATININE: 0.89 mg/dL (ref 0.40–1.50)
GFR: 96.55 mL/min (ref >60.00)
Glucose, Bld: 100 mg/dL — ABNORMAL HIGH (ref 70–99)
Potassium: 3.8 mEq/L (ref 3.5–5.1)
Sodium: 142 mEq/L (ref 135–145)

## 2016-03-04 NOTE — Assessment & Plan Note (Signed)
Nocturnal hypoxemia despite C Pap compliance C Pap titration study on room air shows desaturations with low O2 saturation at 83% with 35 minutes of desaturations noted.  Patient has multiple comorbidities including previous stroke, spinal cord injury with spastic quadriparesis that could lead to hypoventilation along with central sleep apnea events. Pulmonary function test shows no obstructive or restrictive lung disease. VQ scan was normal C Pap titration study does show some desaturations and therefore will add in oxygen with his C Pap. Repeat overnight oximetry test on C Pap with 2 L of oxygen Check labs with CBC and bmet (looking for anemia or hypercarbia ).
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
## 2016-03-04 NOTE — Patient Instructions (Signed)
Labs today  Adding Oxygen 2l/m with CPAP .  Overnight oximetry test on CPAP w./ Oxygen in 6 weeks  follow up Dr. Delton Coombes  In 2 months and As needed

## 2016-03-06 ENCOUNTER — Other Ambulatory Visit: Payer: Self-pay | Admitting: Neurology

## 2016-03-07 NOTE — Telephone Encounter (Signed)
Rx printed, signed, faxed to pharmacy. 

## 2016-03-10 NOTE — Progress Notes (Signed)
LMOM TCB x2

## 2016-03-11 ENCOUNTER — Encounter (HOSPITAL_BASED_OUTPATIENT_CLINIC_OR_DEPARTMENT_OTHER): Payer: Medicare PPO

## 2016-03-15 DIAGNOSIS — M4800 Spinal stenosis, site unspecified: Secondary | ICD-10-CM | POA: Diagnosis not present

## 2016-03-15 DIAGNOSIS — R269 Unspecified abnormalities of gait and mobility: Secondary | ICD-10-CM | POA: Diagnosis not present

## 2016-03-15 DIAGNOSIS — G4733 Obstructive sleep apnea (adult) (pediatric): Secondary | ICD-10-CM | POA: Diagnosis not present

## 2016-03-20 DIAGNOSIS — I10 Essential (primary) hypertension: Secondary | ICD-10-CM | POA: Diagnosis not present

## 2016-04-04 ENCOUNTER — Ambulatory Visit (INDEPENDENT_AMBULATORY_CARE_PROVIDER_SITE_OTHER): Payer: Medicare PPO | Admitting: Neurology

## 2016-04-04 ENCOUNTER — Encounter: Payer: Self-pay | Admitting: Neurology

## 2016-04-04 VITALS — BP 142/86 | HR 64 | Ht 72.0 in | Wt 224.0 lb

## 2016-04-04 DIAGNOSIS — G8250 Quadriplegia, unspecified: Secondary | ICD-10-CM

## 2016-04-04 DIAGNOSIS — I2782 Chronic pulmonary embolism: Secondary | ICD-10-CM | POA: Diagnosis not present

## 2016-04-04 DIAGNOSIS — R269 Unspecified abnormalities of gait and mobility: Secondary | ICD-10-CM | POA: Diagnosis not present

## 2016-04-04 DIAGNOSIS — G4734 Idiopathic sleep related nonobstructive alveolar hypoventilation: Secondary | ICD-10-CM | POA: Diagnosis not present

## 2016-04-04 DIAGNOSIS — R0602 Shortness of breath: Secondary | ICD-10-CM | POA: Diagnosis not present

## 2016-04-04 DIAGNOSIS — G4733 Obstructive sleep apnea (adult) (pediatric): Secondary | ICD-10-CM | POA: Diagnosis not present

## 2016-04-04 DIAGNOSIS — M4800 Spinal stenosis, site unspecified: Secondary | ICD-10-CM | POA: Diagnosis not present

## 2016-04-04 MED ORDER — BACLOFEN 40 MG/20ML IT SOLN: 40.0000 mg | Freq: Once | INTRATHECAL | 0 refills | Status: DC

## 2016-04-04 MED ORDER — BACLOFEN 40 MG/20ML IT SOLN: 40.0000 mg | Freq: Once | INTRATHECAL | Status: DC

## 2016-04-04 NOTE — Addendum Note (Signed)
Addended by: Levert FeinsteinYAN, Sully Manzi on: 04/04/2016 01:45 PM   Modules accepted: Orders

## 2016-04-04 NOTE — Progress Notes (Signed)
Please refer to baclofen pump procedure note. 

## 2016-04-04 NOTE — Procedures (Signed)
      History:  Ralph [name] is a 50 year old patient with a history of a prior spinal cord injury with a spastic quadriparesis. The patient comes in for a baclofen pump refill. He has had one fall recently in the last 2 weeks, otherwise he has been doing well. The level of spasticity is adequate for him, he does not require an adjustment on the pump settings.  Baclofen pump refill note  The baclofen pump site was cleaned with Betadine solution. A 21-gauge needle was inserted into the pump port site. Approximately 3.5 cc of residual baclofen was removed, the pump indicates a 2.7 cc residual. 20 cc of replacement baclofen was placed into the pump at 2000 mcg/cc concentration.  The pump was reprogrammed for the following settings: The patient was kept at the current rate with a basal rate of 10.2 g per hour. From 12 midnight until 3 AM the patient receives a rate of 11.5 g per hour with a total dose of 34.5 g. From 9 PM until 12 midnight the patient receives a dose of 11.5 g per hour, total dose of 34.5 g. The total daily dose is 252.6 g.  The alarm volume is set at 2.0 cc. The next alarm date is 08/24/2006.  The patient tolerated the procedure well. There were no complications of the above procedure.  The NDC number is [id number] The baclofen expiration date is July 2019. The baclofen lot number is [id number].

## 2016-04-15 DIAGNOSIS — R269 Unspecified abnormalities of gait and mobility: Secondary | ICD-10-CM | POA: Diagnosis not present

## 2016-04-15 DIAGNOSIS — M5126 Other intervertebral disc displacement, lumbar region: Secondary | ICD-10-CM | POA: Diagnosis not present

## 2016-04-15 DIAGNOSIS — I10 Essential (primary) hypertension: Secondary | ICD-10-CM | POA: Diagnosis not present

## 2016-04-15 DIAGNOSIS — Z5181 Encounter for therapeutic drug level monitoring: Secondary | ICD-10-CM | POA: Diagnosis not present

## 2016-04-15 DIAGNOSIS — E876 Hypokalemia: Secondary | ICD-10-CM | POA: Diagnosis not present

## 2016-04-15 DIAGNOSIS — G4733 Obstructive sleep apnea (adult) (pediatric): Secondary | ICD-10-CM | POA: Diagnosis not present

## 2016-04-15 DIAGNOSIS — M4800 Spinal stenosis, site unspecified: Secondary | ICD-10-CM | POA: Diagnosis not present

## 2016-04-18 ENCOUNTER — Encounter: Payer: Medicare PPO | Admitting: Neurology

## 2016-04-30 ENCOUNTER — Encounter: Payer: Self-pay | Admitting: Adult Health

## 2016-04-30 DIAGNOSIS — R269 Unspecified abnormalities of gait and mobility: Secondary | ICD-10-CM | POA: Diagnosis not present

## 2016-04-30 DIAGNOSIS — M4800 Spinal stenosis, site unspecified: Secondary | ICD-10-CM | POA: Diagnosis not present

## 2016-04-30 DIAGNOSIS — G4733 Obstructive sleep apnea (adult) (pediatric): Secondary | ICD-10-CM | POA: Diagnosis not present

## 2016-05-04 DIAGNOSIS — G4733 Obstructive sleep apnea (adult) (pediatric): Secondary | ICD-10-CM | POA: Diagnosis not present

## 2016-05-04 DIAGNOSIS — I2782 Chronic pulmonary embolism: Secondary | ICD-10-CM | POA: Diagnosis not present

## 2016-05-04 DIAGNOSIS — R269 Unspecified abnormalities of gait and mobility: Secondary | ICD-10-CM | POA: Diagnosis not present

## 2016-05-04 DIAGNOSIS — R0602 Shortness of breath: Secondary | ICD-10-CM | POA: Diagnosis not present

## 2016-05-04 DIAGNOSIS — M4800 Spinal stenosis, site unspecified: Secondary | ICD-10-CM | POA: Diagnosis not present

## 2016-05-04 DIAGNOSIS — G4734 Idiopathic sleep related nonobstructive alveolar hypoventilation: Secondary | ICD-10-CM | POA: Diagnosis not present

## 2016-05-06 ENCOUNTER — Telehealth: Payer: Self-pay | Admitting: Adult Health

## 2016-05-06 DIAGNOSIS — Z9989 Dependence on other enabling machines and devices: Principal | ICD-10-CM

## 2016-05-06 DIAGNOSIS — G4733 Obstructive sleep apnea (adult) (pediatric): Secondary | ICD-10-CM

## 2016-05-06 NOTE — Telephone Encounter (Signed)
Per verbal order from TP after reviewing ONO on CPAP on 2L  Improved on 2L Only little desats Will need to increase to 3L with CPAP  Lmtcb x1

## 2016-05-08 NOTE — Telephone Encounter (Signed)
Pt informed of ono results.  Pt will increase oxygen to 3L with cpap.  [facility]

## 2016-05-08 NOTE — Telephone Encounter (Signed)
Patient returning call - can be reached at [phone]

## 2016-05-14 ENCOUNTER — Encounter: Payer: Self-pay | Admitting: Neurology

## 2016-05-15 ENCOUNTER — Encounter: Payer: Self-pay | Admitting: Adult Health

## 2016-05-15 ENCOUNTER — Ambulatory Visit (INDEPENDENT_AMBULATORY_CARE_PROVIDER_SITE_OTHER): Payer: Medicare PPO | Admitting: Adult Health

## 2016-05-15 VITALS — BP 136/89 | HR 71 | Ht 72.0 in | Wt 224.4 lb

## 2016-05-15 DIAGNOSIS — Z9989 Dependence on other enabling machines and devices: Secondary | ICD-10-CM

## 2016-05-15 DIAGNOSIS — R269 Unspecified abnormalities of gait and mobility: Secondary | ICD-10-CM | POA: Diagnosis not present

## 2016-05-15 DIAGNOSIS — G4733 Obstructive sleep apnea (adult) (pediatric): Secondary | ICD-10-CM

## 2016-05-15 DIAGNOSIS — M4800 Spinal stenosis, site unspecified: Secondary | ICD-10-CM | POA: Diagnosis not present
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
## 2016-05-15 NOTE — Progress Notes (Signed)
I agree with the assessment and plan as directed by NP .The patient is known to me .   Addylynn Balin, MD  

## 2016-05-15 NOTE — Patient Instructions (Signed)
Continue using CPAP nightly If your symptoms worsen or you develop new symptoms please let us know.   

## 2016-05-16 ENCOUNTER — Ambulatory Visit: Payer: Medicare PPO | Admitting: Emergency Medicine

## 2016-05-19 ENCOUNTER — Encounter: Payer: Self-pay | Admitting: Emergency Medicine

## 2016-05-19 ENCOUNTER — Ambulatory Visit (INDEPENDENT_AMBULATORY_CARE_PROVIDER_SITE_OTHER): Payer: Medicare PPO | Admitting: Emergency Medicine

## 2016-05-19 DIAGNOSIS — R0981 Nasal congestion: Secondary | ICD-10-CM | POA: Diagnosis not present

## 2016-05-19 DIAGNOSIS — Z9989 Dependence on other enabling machines and devices: Secondary | ICD-10-CM

## 2016-05-19 DIAGNOSIS — G4734 Idiopathic sleep related nonobstructive alveolar hypoventilation: Secondary | ICD-10-CM | POA: Diagnosis not present

## 2016-05-19 DIAGNOSIS — G4733 Obstructive sleep apnea (adult) (pediatric): Secondary | ICD-10-CM | POA: Diagnosis not present

## 2016-05-19 HISTORY — DX: Nasal congestion: R09.81

## 2016-05-19 NOTE — Assessment & Plan Note (Signed)
O2 at 3L/min bled into CPAP

## 2016-05-19 NOTE — Assessment & Plan Note (Signed)
We will try adding chlorpheniramine as needed for his nasal congestion. He also uses Flonase occasionally.

## 2016-05-19 NOTE — Patient Instructions (Addendum)
Please continue your CPAP and oxygen as you are using them Try using chlorpheniramine 4mg  up to every 6 hours if needed for nasal congestion.  Follow with Dr Delton CoombesByrum in 12 months or sooner if you have any problems

## 2016-05-19 NOTE — Assessment & Plan Note (Signed)
Please continue your CPAP and oxygen as you are using them Follow with Dr Delton CoombesByrum in 12 months or sooner if you have any problems or

## 2016-05-19 NOTE — Progress Notes (Signed)
Subjective:    Patient ID: Ralph [name], male    DOB: Nov 10, 1965, 50 y.o.   MRN: [id number]  HPI 50 yo Never smoker seen for pulmonary consult for nocturnal hypoxemia 01/31/2016 with Dr. Delton Coombes. Patient has a history of obstructive and central sleep apnea treated with CPAP (on auto titration, w/ sleep study 09/25/15)  .He has a, get a history with previous CVA in 2014, right lower extremity DVT and pulmonary embolism in 2014 treated with anticoagulation. He no longer on anticoagulation. He also has a right to left shunt on Bubble echo that was found in 2014 during stroke workup with Pearlean Brownie. 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   Follow up 03/04/16: Nocturnal Hyoxemia Patient returns for a one-month follow-up. Patient was seen last visit for a very consult for nocturnal hypoxemia despite C Pap compliance.  Patient had an overnight oximetry test while on C Pap that showed extended periods of hypoxemia. Patient has an extensive past medical history as above. He was set up for a pulmonary function test  to rule out any underlying lung disease. Pulmonary function test showed normal lung function with an FEV1 at 115%, ratio 84, FVC 107%, no bronchodilator response, DLCO 102%.. VQ scan was done on July 19 that was normal.  CPAP titration study showed AHI 6.3. Analysis did show 6 obstructive apneas, 4 central apneas and 9 mixed apneas.  Average O2 saturation was 92% with the lowest desaturation at 83%. 35 minutes of desaturations below 90% . Recommendations for auto C Pap at 7-15 cm of H2O. Patient says his C Pap settings have been changed remotely his titration study. He has not been started on oxygen with his C Pap. Discussed adding in oxygen to his C Pap.  He denies any chest pain, orthopnea, PND, or increased leg swelling. Does have chronic left arm weakness from spinal cord injury and has generalized weakness, balance issues and lower extremity spasticity due  to his spinal cord injury.  ROV 05/19/16 -- This follow-up visit for patient with history of obstructive and central sleep apnea, noted to have nocturnal hypoxemia also has a history of CVA, right lower extremity DVT and PE, intracardiac shunt noted on bubble study. At his last visit we added 3 L/m to his CPAP, AutoSet 7 - 15 cm H2O. I performed a ventilation perfusion scan on 02/20/16 to look for chronic thromboembolic disease. This was a normal scan. His sleepiness is improved, still will have some urge to nap during the day. Good compliance - 97% of nights uses > 4 hours. He is having afternoon nasal congestion. He tried using flonase for a month to see if this would help.    Review of Systems Constitutional:   No  weight loss, night sweats,  Fevers, chills,  +fatigue, or  lassitude.  HEENT:   No headaches,  Difficulty swallowing,  Tooth/dental problems, or  Sore throat,                No sneezing, itching, ear ache, nasal congestion, post nasal drip,   CV:  No chest pain,  Orthopnea, PND, swelling in lower extremities, anasarca, dizziness, palpitations, syncope.   GI  No heartburn, indigestion, abdominal pain, nausea, vomiting, diarrhea, change in bowel habits, loss of appetite, bloody stools.   Resp: No shortness of breath with exertion or at rest.  No excess mucus, no productive cough,  No non-productive cough,  No coughing up of blood.  No change  in color of mucus.  No wheezing.  No chest wall deformity  Skin: no rash or lesions.  GU: no dysuria, change in color of urine, no urgency or frequency.  No flank pain, no hematuria   MS:  No joint pain or swelling.  No decreased range of motion.  No back pain.  Psych:  No change in mood or affect. No depression or anxiety.  No memory loss.       Objective:   Physical Exam Vitals:   05/19/16 1345  BP: (!) 144/90  Pulse: 75  SpO2: 97%  Weight: 103.9 kg (229 lb)  Height: 6' (1.829 m)  Body mass index is 31.06 kg/m.    Gen:  Pleasant, well-nourished, in no distress,  normal affect  ENT: No lesions,  mouth clear,  oropharynx clear, no postnasal drip  Neck: No JVD, no TMG, no carotid bruits  Lungs: No use of accessory muscles, no dullness to percussion, clear without rales or rhonchi  Cardiovascular: RRR, heart sounds normal, no murmur or gallops, no peripheral edema  Musculoskeletal: No deformities, no cyanosis or clubbing  Neuro: alert, non focal  Skin: Warm, no lesions or rashes      Assessment & Plan:  OSA on CPAP Please continue your CPAP and oxygen as you are using them Follow with Dr Delton CoombesByrum in 12 months or sooner if you have any problems or  Nocturnal hypoxemia O2 at 3L/min bled into CPAP  Nasal congestion We will try adding chlorpheniramine as needed for his nasal congestion. He also uses Flonase occasionally.  Levy Pupaobert Cleora Karnik, MD, PhD 05/19/2016, 2:10 PM [facility] [phone] or if no answer [phone]

## 2016-05-30 DIAGNOSIS — M4800 Spinal stenosis, site unspecified: Secondary | ICD-10-CM | POA: Diagnosis not present

## 2016-05-30 DIAGNOSIS — G4733 Obstructive sleep apnea (adult) (pediatric): Secondary | ICD-10-CM | POA: Diagnosis not present

## 2016-05-30 DIAGNOSIS — R269 Unspecified abnormalities of gait and mobility: Secondary | ICD-10-CM | POA: Diagnosis not present

## 2016-05-30 DIAGNOSIS — M5416 Radiculopathy, lumbar region: Secondary | ICD-10-CM | POA: Diagnosis not present

## 2016-06-04 DIAGNOSIS — G4734 Idiopathic sleep related nonobstructive alveolar hypoventilation: Secondary | ICD-10-CM | POA: Diagnosis not present

## 2016-06-04 DIAGNOSIS — I2782 Chronic pulmonary embolism: Secondary | ICD-10-CM | POA: Diagnosis not present

## 2016-06-04 DIAGNOSIS — M4800 Spinal stenosis, site unspecified: Secondary | ICD-10-CM | POA: Diagnosis not present

## 2016-06-04 DIAGNOSIS — G4733 Obstructive sleep apnea (adult) (pediatric): Secondary | ICD-10-CM | POA: Diagnosis not present

## 2016-06-04 DIAGNOSIS — R269 Unspecified abnormalities of gait and mobility: Secondary | ICD-10-CM | POA: Diagnosis not present

## 2016-06-04 DIAGNOSIS — R0602 Shortness of breath: Secondary | ICD-10-CM | POA: Diagnosis not present

## 2016-06-05 ENCOUNTER — Other Ambulatory Visit: Payer: Self-pay | Admitting: Orthopedic Surgery

## 2016-06-05 DIAGNOSIS — M5416 Radiculopathy, lumbar region: Secondary | ICD-10-CM

## 2016-06-10 ENCOUNTER — Telehealth: Payer: Self-pay | Admitting: Neurology

## 2016-06-10 NOTE — Telephone Encounter (Signed)
Okay to have the patient dropped by in the afternoon for a quick check of the pump following the MRI.

## 2016-06-10 NOTE — Telephone Encounter (Signed)
Patient called to advise, he is having MRI 11/14 8:00am and is supposed to let Dr. Anne HahnWillis know, to make sure Baclofen pump doesn't cut off. Would like to come by our office after MRI to check machine to make sure it hasn't quit. Please call to advise.

## 2016-06-11 NOTE — Telephone Encounter (Signed)
I called patient back and he is aware that it is ok to come by and have pump checked after MRI on 14th.

## 2016-06-15 DIAGNOSIS — M4800 Spinal stenosis, site unspecified: Secondary | ICD-10-CM | POA: Diagnosis not present

## 2016-06-15 DIAGNOSIS — R269 Unspecified abnormalities of gait and mobility: Secondary | ICD-10-CM | POA: Diagnosis not present

## 2016-06-15 DIAGNOSIS — G4733 Obstructive sleep apnea (adult) (pediatric): Secondary | ICD-10-CM | POA: Diagnosis not present

## 2016-06-17 ENCOUNTER — Ambulatory Visit
Admission: RE | Admit: 2016-06-17 | Discharge: 2016-06-17 | Disposition: A | Discharge status: Home / Self Care | Payer: Medicare PPO | Source: Ambulatory Visit | Attending: Orthopedic Surgery | Admitting: Orthopedic Surgery

## 2016-06-17 DIAGNOSIS — M5416 Radiculopathy, lumbar region: Secondary | ICD-10-CM

## 2016-06-17 DIAGNOSIS — M4326 Fusion of spine, lumbar region: Secondary | ICD-10-CM | POA: Diagnosis not present

## 2016-06-17 NOTE — Progress Notes (Signed)
Error

## 2016-06-18 NOTE — Telephone Encounter (Signed)
06/17/16 0845 - Pt arrived after MRI to have baclofen pump checked. BP 148/86, HR 78. Ambulating w/ stooped posture using walking stick/cane. C/o lower back pain rated @ 8/10 that radiates down bilateral legs. Pump was checked by Dr. Anne HahnWillis.

## 2016-06-19 DIAGNOSIS — H1033 Unspecified acute conjunctivitis, bilateral: Secondary | ICD-10-CM | POA: Diagnosis not present

## 2016-06-24 ENCOUNTER — Ambulatory Visit (INDEPENDENT_AMBULATORY_CARE_PROVIDER_SITE_OTHER): Payer: Medicare PPO | Admitting: Allergy

## 2016-06-24 ENCOUNTER — Encounter: Payer: Self-pay | Admitting: Allergy

## 2016-06-24 VITALS — BP 140/96 | HR 88 | Temp 98.3°F | Resp 16 | Ht 72.0 in | Wt 178.4 lb

## 2016-06-24 DIAGNOSIS — T6391XD Toxic effect of contact with unspecified venomous animal, accidental (unintentional), subsequent encounter: Secondary | ICD-10-CM | POA: Diagnosis not present

## 2016-06-24 DIAGNOSIS — J310 Chronic rhinitis: Secondary | ICD-10-CM

## 2016-06-24 DIAGNOSIS — R0981 Nasal congestion: Secondary | ICD-10-CM

## 2016-06-24 MED ORDER — EPINEPHRINE 0.3 MG/0.3ML IJ SOAJ: 0.3000 mg | Freq: Once | INTRAMUSCULAR | 3 refills | Status: AC

## 2016-06-24 MED ORDER — AZELASTINE HCL 0.1 % NA SOLN: 2.0000 | Freq: Two times a day (BID) | NASAL | 5 refills | Status: DC

## 2016-06-24 NOTE — Patient Instructions (Addendum)
Nasal stuffiness Allergy skin testing today was negative to environmental allergens.  Some medications may cause or worsen nasal congestion/stuffiness.  Of the medications you are taking nortriptyline may worsen your symptoms.    Non-allergic triggers (vasomotor rhinitis) of nasal stuffiness/congestion can include changes in the temperature, strong odors, pollutants  Nasal decongestants like Afrin if used more than 3-5 days at a time may cause rebound inflammation (worsening nasal congestion upon discontinuation of medication).   Would avoid Afrin use as much as possible unless you are not able to breathe from both nostrils.    If this is the case you may use Afrin 2 sprays each nostril for max of 3 days- wait 15-20 minutes then follow with your nasal spray(s).  To treat will start with Azelastine nasal spray 2 sprays each nostril twice a day.     Give this spray about 2-3 weeks.   If you do not notice any improvements would add on Flonase (or Rhinocort or Nasacort over-the-counter) to the Azelastine regimen.      Stinging insect reaction Given history of swelling, nausea and feeling dizzy/headache would recommend venom skin testing for possible venom immunotherapy (which is at the last a 3-5 year commitment of weekly up to every 6-8 week shots) Will prescribe an Epipen to use in case of sting with allergic reaction  Follow-up 3 months
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
## 2016-07-01 DIAGNOSIS — M5416 Radiculopathy, lumbar region: Secondary | ICD-10-CM | POA: Diagnosis not present

## 2016-07-04 DIAGNOSIS — R0602 Shortness of breath: Secondary | ICD-10-CM | POA: Diagnosis not present

## 2016-07-04 DIAGNOSIS — G4733 Obstructive sleep apnea (adult) (pediatric): Secondary | ICD-10-CM | POA: Diagnosis not present

## 2016-07-04 DIAGNOSIS — I2782 Chronic pulmonary embolism: Secondary | ICD-10-CM | POA: Diagnosis not present

## 2016-07-04 DIAGNOSIS — M4800 Spinal stenosis, site unspecified: Secondary | ICD-10-CM | POA: Diagnosis not present

## 2016-07-04 DIAGNOSIS — R269 Unspecified abnormalities of gait and mobility: Secondary | ICD-10-CM | POA: Diagnosis not present

## 2016-07-04 DIAGNOSIS — G4734 Idiopathic sleep related nonobstructive alveolar hypoventilation: Secondary | ICD-10-CM | POA: Diagnosis not present

## 2016-07-07 DIAGNOSIS — M48061 Spinal stenosis, lumbar region without neurogenic claudication: Secondary | ICD-10-CM | POA: Diagnosis not present

## 2016-07-07 DIAGNOSIS — M5136 Other intervertebral disc degeneration, lumbar region: Secondary | ICD-10-CM | POA: Diagnosis not present

## 2016-07-15 DIAGNOSIS — G4733 Obstructive sleep apnea (adult) (pediatric): Secondary | ICD-10-CM | POA: Diagnosis not present

## 2016-07-15 DIAGNOSIS — M4800 Spinal stenosis, site unspecified: Secondary | ICD-10-CM | POA: Diagnosis not present

## 2016-07-15 DIAGNOSIS — R269 Unspecified abnormalities of gait and mobility: Secondary | ICD-10-CM | POA: Diagnosis not present

## 2016-07-21 DIAGNOSIS — M48062 Spinal stenosis, lumbar region with neurogenic claudication: Secondary | ICD-10-CM | POA: Diagnosis not present

## 2016-07-23 ENCOUNTER — Other Ambulatory Visit: Payer: Self-pay | Admitting: Orthopedic Surgery

## 2016-07-27 ENCOUNTER — Encounter (HOSPITAL_COMMUNITY): Payer: Self-pay

## 2016-07-27 ENCOUNTER — Inpatient Hospital Stay (HOSPITAL_COMMUNITY)
Admission: EM | Admit: 2016-07-27 | Discharge: 2016-07-31 | DRG: 417 | Disposition: A | Discharge status: Home / Self Care | Payer: Medicare PPO | Attending: Internal Medicine | Admitting: Internal Medicine

## 2016-07-27 ENCOUNTER — Emergency Department (HOSPITAL_COMMUNITY): Payer: Medicare PPO

## 2016-07-27 DIAGNOSIS — R17 Unspecified jaundice: Secondary | ICD-10-CM | POA: Diagnosis not present

## 2016-07-27 DIAGNOSIS — N179 Acute kidney failure, unspecified: Secondary | ICD-10-CM | POA: Diagnosis present

## 2016-07-27 DIAGNOSIS — Z79899 Other long term (current) drug therapy: Secondary | ICD-10-CM | POA: Diagnosis not present

## 2016-07-27 DIAGNOSIS — A419 Sepsis, unspecified organism: Secondary | ICD-10-CM | POA: Diagnosis not present

## 2016-07-27 DIAGNOSIS — M545 Low back pain: Secondary | ICD-10-CM | POA: Diagnosis present

## 2016-07-27 DIAGNOSIS — R945 Abnormal results of liver function studies: Secondary | ICD-10-CM

## 2016-07-27 DIAGNOSIS — G4733 Obstructive sleep apnea (adult) (pediatric): Secondary | ICD-10-CM | POA: Diagnosis present

## 2016-07-27 DIAGNOSIS — Z8673 Personal history of transient ischemic attack (TIA), and cerebral infarction without residual deficits: Secondary | ICD-10-CM | POA: Diagnosis not present

## 2016-07-27 DIAGNOSIS — R7989 Other specified abnormal findings of blood chemistry: Secondary | ICD-10-CM | POA: Diagnosis not present

## 2016-07-27 DIAGNOSIS — G8250 Quadriplegia, unspecified: Secondary | ICD-10-CM | POA: Diagnosis present

## 2016-07-27 DIAGNOSIS — Z86718 Personal history of other venous thrombosis and embolism: Secondary | ICD-10-CM

## 2016-07-27 DIAGNOSIS — K8000 Calculus of gallbladder with acute cholecystitis without obstruction: Secondary | ICD-10-CM | POA: Diagnosis not present

## 2016-07-27 DIAGNOSIS — Z86711 Personal history of pulmonary embolism: Secondary | ICD-10-CM | POA: Diagnosis not present

## 2016-07-27 DIAGNOSIS — K219 Gastro-esophageal reflux disease without esophagitis: Secondary | ICD-10-CM | POA: Diagnosis not present

## 2016-07-27 DIAGNOSIS — R110 Nausea: Secondary | ICD-10-CM | POA: Diagnosis not present

## 2016-07-27 DIAGNOSIS — R5082 Postprocedural fever: Secondary | ICD-10-CM | POA: Diagnosis not present

## 2016-07-27 DIAGNOSIS — R1011 Right upper quadrant pain: Secondary | ICD-10-CM | POA: Diagnosis not present

## 2016-07-27 DIAGNOSIS — R000 Tachycardia, unspecified: Secondary | ICD-10-CM | POA: Diagnosis not present

## 2016-07-27 DIAGNOSIS — K810 Acute cholecystitis: Secondary | ICD-10-CM | POA: Diagnosis present

## 2016-07-27 DIAGNOSIS — G8929 Other chronic pain: Secondary | ICD-10-CM | POA: Diagnosis present

## 2016-07-27 DIAGNOSIS — K8020 Calculus of gallbladder without cholecystitis without obstruction: Secondary | ICD-10-CM | POA: Diagnosis not present

## 2016-07-27 DIAGNOSIS — G4730 Sleep apnea, unspecified: Secondary | ICD-10-CM | POA: Diagnosis not present

## 2016-07-27 DIAGNOSIS — R799 Abnormal finding of blood chemistry, unspecified: Secondary | ICD-10-CM | POA: Diagnosis not present

## 2016-07-27 DIAGNOSIS — I10 Essential (primary) hypertension: Secondary | ICD-10-CM | POA: Diagnosis present

## 2016-07-27 DIAGNOSIS — D72829 Elevated white blood cell count, unspecified: Secondary | ICD-10-CM | POA: Diagnosis not present

## 2016-07-27 DIAGNOSIS — R0781 Pleurodynia: Secondary | ICD-10-CM | POA: Diagnosis not present

## 2016-07-27 DIAGNOSIS — R509 Fever, unspecified: Secondary | ICD-10-CM | POA: Diagnosis not present

## 2016-07-27 DIAGNOSIS — I9581 Postprocedural hypotension: Secondary | ICD-10-CM | POA: Diagnosis not present

## 2016-07-27 DIAGNOSIS — Z9989 Dependence on other enabling machines and devices: Secondary | ICD-10-CM

## 2016-07-27 HISTORY — DX: Acute cholecystitis: K81.0

## 2016-07-27 HISTORY — DX: Unspecified osteoarthritis, unspecified site: M19.90

## 2016-07-27 HISTORY — DX: Cerebral infarction, unspecified: I63.9

## 2016-07-27 HISTORY — DX: Other specified abnormal findings of blood chemistry: R79.89

## 2016-07-27 LAB — URINALYSIS, ROUTINE W REFLEX MICROSCOPIC
Bacteria, UA: NONE SEEN
Bilirubin Urine: NEGATIVE
Glucose, UA: NEGATIVE mg/dL
Ketones, ur: NEGATIVE mg/dL
Leukocytes, UA: NEGATIVE
Nitrite: NEGATIVE
Protein, ur: 30 mg/dL — AB
SPECIFIC GRAVITY, URINE: 1.019 (ref 1.005–1.030)
SQUAMOUS EPITHELIAL / LPF: NONE SEEN
pH: 5 (ref 5.0–8.0)

## 2016-07-27 LAB — I-STAT CG4 LACTIC ACID, ED: Lactic Acid, Venous: 1.01 mmol/L (ref 0.5–1.9)

## 2016-07-27 MED ORDER — PIPERACILLIN-TAZOBACTAM 3.375 G IVPB
3.3750 g | Freq: Three times a day (TID) | INTRAVENOUS | Status: DC
  Administered 2016-07-28 – 2016-07-30 (×8): 3.375 g via INTRAVENOUS
  Filled 2016-07-27 (×9): qty 50

## 2016-07-27 MED ORDER — HYDROMORPHONE HCL 2 MG/ML IJ SOLN
1.0000 mg | Freq: Once | INTRAMUSCULAR | Status: AC
  Administered 2016-07-27: 1 mg via INTRAVENOUS
  Filled 2016-07-27: qty 1

## 2016-07-27 MED ORDER — PIPERACILLIN-TAZOBACTAM 3.375 G IVPB 30 MIN
3.3750 g | Freq: Once | INTRAVENOUS | Status: DC
  Filled 2016-07-27: qty 50

## 2016-07-27 MED ORDER — ONDANSETRON HCL 4 MG/2ML IJ SOLN
4.0000 mg | Freq: Once | INTRAMUSCULAR | Status: AC
  Administered 2016-07-27: 4 mg via INTRAVENOUS
  Filled 2016-07-27: qty 2

## 2016-07-27 MED ORDER — HYDROMORPHONE HCL 2 MG/ML IJ SOLN
1.0000 mg | INTRAMUSCULAR | Status: DC | PRN
  Administered 2016-07-28: 1 mg via INTRAVENOUS
  Filled 2016-07-27: qty 1

## 2016-07-27 MED ORDER — BACITRACIN ZINC 500 UNIT/GM EX OINT
TOPICAL_OINTMENT | CUTANEOUS | Status: AC
Start: 2016-07-27 — End: 2016-07-27
  Filled 2016-07-27: qty 28.35

## 2016-07-27 MED ORDER — SODIUM CHLORIDE 0.9 % IV BOLUS (SEPSIS)
2000.0000 mL | Freq: Once | INTRAVENOUS | Status: AC
  Administered 2016-07-27: 2000 mL via INTRAVENOUS

## 2016-07-27 NOTE — ED Triage Notes (Signed)
Patient transported via [facility] due to suspected of Cholecystitis. The hospital he was sent from did not have imaging available due to the holiday.  Patients BP during transport was 145/75, O2 was 96, and pulse was 85.
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
## 2016-07-27 NOTE — ED Notes (Signed)
Provider requested patient only receive 1L of NS because [facility].

## 2016-07-27 NOTE — Progress Notes (Signed)
Pharmacy Antibiotic Note  Ralph [name] is a 50 y.o. male admitted on 07/27/2016 with intra-abdominal infection.  Pharmacy has been consulted for Zosyn dosing.  No recent labs, no PMH or clinical indication to suspect AKI at this time.  Plan: Zosyn 3.375g IV q8h EI Follow c/s, renal function, LOT   Height: 6' (182.9 cm) Weight: 220 lb (99.8 kg) IBW/kg (Calculated) : 77.6  Temp (24hrs), Avg:99.6 F (37.6 C), Min:99.6 F (37.6 C), Max:99.6 F (37.6 C)  No results for input(s): WBC, CREATININE, LATICACIDVEN, VANCOTROUGH, VANCOPEAK, VANCORANDOM, GENTTROUGH, GENTPEAK, GENTRANDOM, TOBRATROUGH, TOBRAPEAK, TOBRARND, AMIKACINPEAK, AMIKACINTROU, AMIKACIN in the last 168 hours.  CrCl cannot be calculated (Patient's most recent lab result is older than the maximum 21 days allowed.).    No Known Allergies  Antimicrobials this admission: Zosyn 12/24 >>   Dose adjustments this admission: n/a  Microbiology results: 12/24 BCx:  12/24 UCx:    Thank you for allowing pharmacy to be a part of this patient's care.  Steele Ledonne D. Lux Skilton, PharmD, BCPS Clinical Pharmacist Pager: [phone] 07/27/2016 6:40 PM
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
## 2016-07-28 ENCOUNTER — Encounter (HOSPITAL_COMMUNITY)
Admission: EM | Disposition: A | Discharge status: Home / Self Care | Payer: Self-pay | Source: Home / Self Care | Attending: Internal Medicine

## 2016-07-28 ENCOUNTER — Encounter (HOSPITAL_COMMUNITY): Payer: Self-pay | Admitting: Anesthesiology

## 2016-07-28 ENCOUNTER — Inpatient Hospital Stay (HOSPITAL_COMMUNITY): Payer: Medicare PPO | Admitting: Anesthesiology

## 2016-07-28 HISTORY — PX: CHOLECYSTECTOMY: SHX55

## 2016-07-28 LAB — COMPREHENSIVE METABOLIC PANEL
ALT: 62 U/L (ref 17–63)
ANION GAP: 10 (ref 5–15)
AST: 53 U/L — ABNORMAL HIGH (ref 15–41)
Albumin: 3.3 g/dL — ABNORMAL LOW (ref 3.5–5.0)
Alkaline Phosphatase: 80 U/L (ref 38–126)
BILIRUBIN TOTAL: 3 mg/dL — AB (ref 0.3–1.2)
BUN: 11 mg/dL (ref 6–20)
CALCIUM: 8.2 mg/dL — AB (ref 8.9–10.3)
CO2: 23 mmol/L (ref 22–32)
CREATININE: 1.25 mg/dL — AB (ref 0.61–1.24)
Chloride: 104 mmol/L (ref 101–111)
Glucose, Bld: 76 mg/dL (ref 65–99)
Potassium: 3.8 mmol/L (ref 3.5–5.1)
SODIUM: 137 mmol/L (ref 135–145)
TOTAL PROTEIN: 6 g/dL — AB (ref 6.5–8.1)

## 2016-07-28 LAB — URINE CULTURE: Culture: NO GROWTH

## 2016-07-28 LAB — CBC
HCT: 43.2 % (ref 39.0–52.0)
HEMOGLOBIN: 14 g/dL (ref 13.0–17.0)
MCH: 29.7 pg (ref 26.0–34.0)
MCHC: 32.4 g/dL (ref 30.0–36.0)
MCV: 91.7 fL (ref 78.0–100.0)
PLATELETS: 141 10*3/uL — AB (ref 150–400)
RBC: 4.71 MIL/uL (ref 4.22–5.81)
RDW: 14.3 % (ref 11.5–15.5)
WBC: 11.9 10*3/uL — AB (ref 4.0–10.5)

## 2016-07-28 LAB — SURGICAL PCR SCREEN
MRSA, PCR: NEGATIVE
STAPHYLOCOCCUS AUREUS: NEGATIVE

## 2016-07-28 SURGERY — LAPAROSCOPIC CHOLECYSTECTOMY WITH INTRAOPERATIVE CHOLANGIOGRAM
Anesthesia: General

## 2016-07-28 MED ORDER — OXYCODONE HCL 5 MG PO TABS
5.0000 mg | ORAL_TABLET | ORAL | Status: DC | PRN
  Administered 2016-07-28 – 2016-07-29 (×5): 10 mg via ORAL
  Filled 2016-07-28 (×5): qty 2

## 2016-07-28 MED ORDER — METOPROLOL SUCCINATE ER 100 MG PO TB24
100.0000 mg | ORAL_TABLET | Freq: Every day | ORAL | Status: DC
  Administered 2016-07-28 – 2016-07-31 (×4): 100 mg via ORAL
  Filled 2016-07-28 (×4): qty 1

## 2016-07-28 MED ORDER — ONDANSETRON HCL 4 MG/2ML IJ SOLN
4.0000 mg | Freq: Four times a day (QID) | INTRAMUSCULAR | Status: DC | PRN
  Administered 2016-07-28 – 2016-07-30 (×3): 4 mg via INTRAVENOUS
  Filled 2016-07-28: qty 2

## 2016-07-28 MED ORDER — SUCCINYLCHOLINE CHLORIDE 20 MG/ML IJ SOLN
INTRAMUSCULAR | Status: DC | PRN
  Administered 2016-07-28: 100 mg via INTRAVENOUS

## 2016-07-28 MED ORDER — PROMETHAZINE HCL 25 MG/ML IJ SOLN: 6.2500 mg | INTRAMUSCULAR | Status: DC | PRN

## 2016-07-28 MED ORDER — FENTANYL CITRATE (PF) 100 MCG/2ML IJ SOLN
INTRAMUSCULAR | Status: AC
  Filled 2016-07-28: qty 4

## 2016-07-28 MED ORDER — PROPOFOL 10 MG/ML IV BOLUS
INTRAVENOUS | Status: DC | PRN
  Administered 2016-07-28: 150 mg via INTRAVENOUS

## 2016-07-28 MED ORDER — PHENYLEPHRINE HCL 10 MG/ML IJ SOLN
INTRAMUSCULAR | Status: DC | PRN
  Administered 2016-07-28: 120 ug via INTRAVENOUS
  Administered 2016-07-28: 80 ug via INTRAVENOUS
  Administered 2016-07-28: 200 ug via INTRAVENOUS

## 2016-07-28 MED ORDER — MIDAZOLAM HCL 2 MG/2ML IJ SOLN: 0.5000 mg | Freq: Once | INTRAMUSCULAR | Status: DC | PRN

## 2016-07-28 MED ORDER — BUPIVACAINE HCL (PF) 0.25 % IJ SOLN
INTRAMUSCULAR | Status: AC
  Filled 2016-07-28: qty 30

## 2016-07-28 MED ORDER — ZOLPIDEM TARTRATE 5 MG PO TABS: 5.0000 mg | ORAL_TABLET | Freq: Every evening | ORAL | Status: DC | PRN

## 2016-07-28 MED ORDER — FENTANYL CITRATE (PF) 100 MCG/2ML IJ SOLN
INTRAMUSCULAR | Status: DC | PRN
  Administered 2016-07-28: 50 ug via INTRAVENOUS
  Administered 2016-07-28: 100 ug via INTRAVENOUS

## 2016-07-28 MED ORDER — MEPERIDINE HCL 25 MG/ML IJ SOLN: 6.2500 mg | INTRAMUSCULAR | Status: DC | PRN

## 2016-07-28 MED ORDER — HYDROMORPHONE HCL 1 MG/ML IJ SOLN
INTRAMUSCULAR | Status: AC
  Filled 2016-07-28: qty 0.5

## 2016-07-28 MED ORDER — ACETAMINOPHEN 325 MG PO TABS
650.0000 mg | ORAL_TABLET | Freq: Four times a day (QID) | ORAL | Status: DC | PRN
  Administered 2016-07-28 – 2016-07-31 (×8): 650 mg via ORAL
  Filled 2016-07-28 (×8): qty 2

## 2016-07-28 MED ORDER — SODIUM CHLORIDE 0.9 % IV SOLN
INTRAVENOUS | Status: DC
  Administered 2016-07-28 – 2016-07-30 (×7): via INTRAVENOUS

## 2016-07-28 MED ORDER — EPINEPHRINE PF 1 MG/ML IJ SOLN
INTRAMUSCULAR | Status: AC
  Filled 2016-07-28: qty 1

## 2016-07-28 MED ORDER — ONDANSETRON HCL 4 MG PO TABS
4.0000 mg | ORAL_TABLET | Freq: Four times a day (QID) | ORAL | Status: DC | PRN
  Administered 2016-07-30 – 2016-07-31 (×2): 4 mg via ORAL
  Filled 2016-07-28 (×2): qty 1

## 2016-07-28 MED ORDER — GABAPENTIN 400 MG PO CAPS
800.0000 mg | ORAL_CAPSULE | Freq: Three times a day (TID) | ORAL | Status: DC
  Administered 2016-07-28 – 2016-07-31 (×9): 800 mg via ORAL
  Filled 2016-07-28 (×10): qty 2

## 2016-07-28 MED ORDER — ROCURONIUM BROMIDE 100 MG/10ML IV SOLN
INTRAVENOUS | Status: DC | PRN
  Administered 2016-07-28: 40 mg via INTRAVENOUS

## 2016-07-28 MED ORDER — ENOXAPARIN SODIUM 40 MG/0.4ML ~~LOC~~ SOLN: 40.0000 mg | SUBCUTANEOUS | Status: DC

## 2016-07-28 MED ORDER — BUPIVACAINE-EPINEPHRINE 0.25% -1:200000 IJ SOLN
INTRAMUSCULAR | Status: DC | PRN
  Administered 2016-07-28: 30 mL

## 2016-07-28 MED ORDER — PROPOFOL 10 MG/ML IV BOLUS
INTRAVENOUS | Status: AC
  Filled 2016-07-28: qty 20

## 2016-07-28 MED ORDER — SUGAMMADEX SODIUM 200 MG/2ML IV SOLN
INTRAVENOUS | Status: DC | PRN
  Administered 2016-07-28: 400 mg via INTRAVENOUS

## 2016-07-28 MED ORDER — ACETAMINOPHEN 650 MG RE SUPP: 650.0000 mg | Freq: Four times a day (QID) | RECTAL | Status: DC | PRN

## 2016-07-28 MED ORDER — 0.9 % SODIUM CHLORIDE (POUR BTL) OPTIME
TOPICAL | Status: DC | PRN
Start: 2016-07-28 — End: 2016-07-28
  Administered 2016-07-28: 1000 mL

## 2016-07-28 MED ORDER — AMLODIPINE BESYLATE 5 MG PO TABS
5.0000 mg | ORAL_TABLET | Freq: Every day | ORAL | Status: DC
  Administered 2016-07-28 – 2016-07-29 (×3): 5 mg via ORAL
  Filled 2016-07-28 (×3): qty 1

## 2016-07-28 MED ORDER — POTASSIUM CHLORIDE CRYS ER 20 MEQ PO TBCR
20.0000 meq | EXTENDED_RELEASE_TABLET | Freq: Two times a day (BID) | ORAL | Status: DC
  Administered 2016-07-28 – 2016-07-31 (×6): 20 meq via ORAL
  Filled 2016-07-28 (×6): qty 1

## 2016-07-28 MED ORDER — PHENYLEPHRINE 40 MCG/ML (10ML) SYRINGE FOR IV PUSH (FOR BLOOD PRESSURE SUPPORT)
PREFILLED_SYRINGE | INTRAVENOUS | Status: AC
  Filled 2016-07-28: qty 10

## 2016-07-28 MED ORDER — SUGAMMADEX SODIUM 200 MG/2ML IV SOLN
INTRAVENOUS | Status: AC
  Filled 2016-07-28: qty 4

## 2016-07-28 MED ORDER — NON FORMULARY: 1.0000 mg | Freq: Every evening | Status: DC | PRN

## 2016-07-28 MED ORDER — NORTRIPTYLINE HCL 25 MG PO CAPS
150.0000 mg | ORAL_CAPSULE | Freq: Every day | ORAL | Status: DC
  Administered 2016-07-28 – 2016-07-30 (×4): 150 mg via ORAL
  Filled 2016-07-28 (×5): qty 6

## 2016-07-28 MED ORDER — MIDAZOLAM HCL 2 MG/2ML IJ SOLN
INTRAMUSCULAR | Status: AC
  Filled 2016-07-28: qty 2

## 2016-07-28 MED ORDER — EPHEDRINE SULFATE 50 MG/ML IJ SOLN
INTRAMUSCULAR | Status: DC | PRN
  Administered 2016-07-28: 10 mg via INTRAVENOUS

## 2016-07-28 MED ORDER — EPHEDRINE 5 MG/ML INJ
INTRAVENOUS | Status: AC
  Filled 2016-07-28: qty 10

## 2016-07-28 MED ORDER — LIDOCAINE HCL (CARDIAC) 20 MG/ML IV SOLN
INTRAVENOUS | Status: DC | PRN
  Administered 2016-07-28: 25 mg via INTRAVENOUS

## 2016-07-28 MED ORDER — HYDROMORPHONE HCL 1 MG/ML IJ SOLN
0.2500 mg | INTRAMUSCULAR | Status: DC | PRN
Start: 2016-07-28 — End: 2016-07-28
  Administered 2016-07-28 (×2): 0.5 mg via INTRAVENOUS

## 2016-07-28 MED ORDER — IOPAMIDOL (ISOVUE-300) INJECTION 61%
INTRAVENOUS | Status: AC
  Filled 2016-07-28: qty 50

## 2016-07-28 MED ORDER — PANTOPRAZOLE SODIUM 40 MG PO TBEC
80.0000 mg | DELAYED_RELEASE_TABLET | Freq: Every day | ORAL | Status: DC
  Administered 2016-07-29 – 2016-07-31 (×3): 80 mg via ORAL
  Filled 2016-07-28 (×3): qty 2

## 2016-07-28 MED ORDER — SODIUM CHLORIDE 0.9 % IR SOLN
Status: DC | PRN
  Administered 2016-07-28: 1000 mL

## 2016-07-28 MED ORDER — ROCURONIUM BROMIDE 10 MG/ML (PF) SYRINGE
PREFILLED_SYRINGE | INTRAVENOUS | Status: AC
  Filled 2016-07-28: qty 5

## 2016-07-28 MED ORDER — SUCCINYLCHOLINE CHLORIDE 200 MG/10ML IV SOSY
PREFILLED_SYRINGE | INTRAVENOUS | Status: AC
  Filled 2016-07-28: qty 10

## 2016-07-28 MED ORDER — LACTATED RINGERS IV SOLN
INTRAVENOUS | Status: DC | PRN
  Administered 2016-07-28: 11:00:00 via INTRAVENOUS

## 2016-07-28 MED ORDER — ESZOPICLONE 1 MG PO TABS
1.0000 mg | ORAL_TABLET | Freq: Every evening | ORAL | Status: DC | PRN
  Administered 2016-07-28 – 2016-07-30 (×3): 1 mg via ORAL
  Filled 2016-07-28 (×3): qty 1

## 2016-07-28 MED ORDER — MIDAZOLAM HCL 5 MG/5ML IJ SOLN
INTRAMUSCULAR | Status: DC | PRN
  Administered 2016-07-28: 2 mg via INTRAVENOUS

## 2016-07-28 MED ORDER — ONDANSETRON HCL 4 MG/2ML IJ SOLN
INTRAMUSCULAR | Status: AC
  Filled 2016-07-28: qty 2

## 2016-07-28 SURGICAL SUPPLY — 54 items
ADH SKN CLS APL DERMABOND .7 (GAUZE/BANDAGES/DRESSINGS) ×1
APPLIER CLIP 5 13 M/L LIGAMAX5 (MISCELLANEOUS) ×3
APR CLP MED LRG 5 ANG JAW (MISCELLANEOUS) ×1
BAG SPEC RTRVL 10 TROC 200 (ENDOMECHANICALS) ×2
BLADE SURG ROTATE 9660 (MISCELLANEOUS) IMPLANT
CANISTER SUCTION 2500CC (MISCELLANEOUS) ×3 IMPLANT
CHLORAPREP W/TINT 26ML (MISCELLANEOUS) ×3 IMPLANT
CLIP APPLIE 5 13 M/L LIGAMAX5 (MISCELLANEOUS) ×1 IMPLANT
CLOSURE STERI-STRIP 1/2X4 (GAUZE/BANDAGES/DRESSINGS) ×1
CLOSURE WOUND 1/2 X4 (GAUZE/BANDAGES/DRESSINGS) ×1
CLSR STERI-STRIP ANTIMIC 1/2X4 (GAUZE/BANDAGES/DRESSINGS) ×1 IMPLANT
COVER MAYO STAND STRL (DRAPES) IMPLANT
COVER SURGICAL LIGHT HANDLE (MISCELLANEOUS) ×3 IMPLANT
DERMABOND ADVANCED (GAUZE/BANDAGES/DRESSINGS) ×2
DERMABOND ADVANCED .7 DNX12 (GAUZE/BANDAGES/DRESSINGS) ×1 IMPLANT
DEVICE TROCAR PUNCTURE CLOSURE (ENDOMECHANICALS) ×2 IMPLANT
DRAPE C-ARM 42X72 X-RAY (DRAPES) ×2 IMPLANT
DRSG TEGADERM 2-3/8X2-3/4 SM (GAUZE/BANDAGES/DRESSINGS) ×12 IMPLANT
DRSG TEGADERM 4X4.75 (GAUZE/BANDAGES/DRESSINGS) ×2 IMPLANT
ELECT REM PT RETURN 9FT ADLT (ELECTROSURGICAL) ×3
ELECTRODE REM PT RTRN 9FT ADLT (ELECTROSURGICAL) ×1 IMPLANT
GLOVE BIOGEL PI IND STRL 7.0 (GLOVE) IMPLANT
GLOVE BIOGEL PI IND STRL 8 (GLOVE) ×1 IMPLANT
GLOVE BIOGEL PI INDICATOR 7.0 (GLOVE) ×2
GLOVE BIOGEL PI INDICATOR 8 (GLOVE) ×2
GLOVE ECLIPSE 7.5 STRL STRAW (GLOVE) ×5 IMPLANT
GLOVE SURG SS PI 6.5 STRL IVOR (GLOVE) ×2 IMPLANT
GOWN STRL REUS W/ TWL LRG LVL3 (GOWN DISPOSABLE) ×3 IMPLANT
GOWN STRL REUS W/TWL LRG LVL3 (GOWN DISPOSABLE) ×9
IV NS 1000ML (IV SOLUTION) ×6
IV NS 1000ML BAXH (IV SOLUTION) IMPLANT
KIT BASIN OR (CUSTOM PROCEDURE TRAY) ×3 IMPLANT
KIT ROOM TURNOVER OR (KITS) ×3 IMPLANT
L-HOOK LAP DISP 36CM (ELECTROSURGICAL) ×3
LHOOK LAP DISP 36CM (ELECTROSURGICAL) IMPLANT
NS IRRIG 1000ML POUR BTL (IV SOLUTION) ×3 IMPLANT
PAD ARMBOARD 7.5X6 YLW CONV (MISCELLANEOUS) ×3 IMPLANT
PENCIL BUTTON HOLSTER BLD 10FT (ELECTRODE) ×2 IMPLANT
POUCH RETRIEVAL ECOSAC 10 (ENDOMECHANICALS) ×1 IMPLANT
POUCH RETRIEVAL ECOSAC 10MM (ENDOMECHANICALS) ×4
SCISSORS LAP 5X35 DISP (ENDOMECHANICALS) ×3 IMPLANT
SET CHOLANGIOGRAPH 5 50 .035 (SET/KITS/TRAYS/PACK) IMPLANT
SET IRRIG TUBING LAPAROSCOPIC (IRRIGATION / IRRIGATOR) ×3 IMPLANT
SLEEVE ENDOPATH XCEL 5M (ENDOMECHANICALS) ×6 IMPLANT
SPECIMEN JAR SMALL (MISCELLANEOUS) ×3 IMPLANT
STRIP CLOSURE SKIN 1/2X4 (GAUZE/BANDAGES/DRESSINGS) ×2 IMPLANT
SUT MNCRL AB 4-0 PS2 18 (SUTURE) ×3 IMPLANT
SUT VICRYL 0 UR6 27IN ABS (SUTURE) ×4 IMPLANT
TOWEL OR 17X24 6PK STRL BLUE (TOWEL DISPOSABLE) ×3 IMPLANT
TOWEL OR 17X26 10 PK STRL BLUE (TOWEL DISPOSABLE) ×3 IMPLANT
TRAY LAPAROSCOPIC MC (CUSTOM PROCEDURE TRAY) ×3 IMPLANT
TROCAR XCEL BLUNT TIP 100MML (ENDOMECHANICALS) ×3 IMPLANT
TROCAR XCEL NON-BLD 5MMX100MML (ENDOMECHANICALS) ×3 IMPLANT
TUBING INSUFFLATION (TUBING) ×3 IMPLANT
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
## 2016-07-28 NOTE — Anesthesia Postprocedure Evaluation (Signed)
Anesthesia Post Note  Patient: Ralph [name]  Procedure(s) Performed: Procedure(s) (LRB): LAPAROSCOPIC CHOLECYSTECTOMY (N/A)  Patient location during evaluation: PACU Anesthesia Type: General Level of consciousness: awake and alert, patient cooperative and oriented Pain management: pain level controlled Vital Signs Assessment: post-procedure vital signs reviewed and stable Respiratory status: spontaneous breathing, nonlabored ventilation, respiratory function stable and patient connected to nasal cannula oxygen Cardiovascular status: blood pressure returned to baseline and stable Postop Assessment: no signs of nausea or vomiting Anesthetic complications: no       Last Vitals:  Vitals:   07/28/16 1320 07/28/16 1328  BP: 104/61 (!) 104/49  Pulse: 91 92  Resp: 11 14  Temp:  37.2 C    Last Pain:  Vitals:   07/28/16 1300  TempSrc:   PainSc: 7                  Alylah Blakney,E. Martrice Apt

## 2016-07-28 NOTE — Anesthesia Procedure Notes (Signed)
Procedure Name: Intubation Date/Time: 07/28/2016 10:55 AM Performed by: Carmela RimaMARTINELLI, Shani Fitch F Pre-anesthesia Checklist: Timeout performed, Suction available, Emergency Drugs available and Patient identified Patient Re-evaluated:Patient Re-evaluated prior to inductionOxygen Delivery Method: Circle system utilized Preoxygenation: Pre-oxygenation with 100% oxygen Intubation Type: IV induction and Rapid sequence Laryngoscope Size: Mac and 4 Grade View: Grade I Tube type: Oral Tube size: 7.5 mm Number of attempts: 1 Placement Confirmation: breath sounds checked- equal and bilateral,  positive ETCO2 and ETT inserted through vocal cords under direct vision Secured at: 23 cm Tube secured with: Tape Dental Injury: Teeth and Oropharynx as per pre-operative assessment

## 2016-07-28 NOTE — Interval H&P Note (Signed)
History and Physical Interval Note:  Patient has a palpable gallbladder, will likely require decompression.  Possible open procedure. [name], [name], MD, FACS [phone]--pager [phone]--office [facility]  07/28/2016 10:40 AM  Ralph [name]  has presented today for surgery, with the diagnosis of Acute cholecystitis and cholelithiasis  The various methods of treatment have been discussed with the patient and family. After consideration of risks, benefits and other options for treatment, the patient has consented to  Procedure(s): LAPAROSCOPIC CHOLECYSTECTOMY WITH INTRAOPERATIVE CHOLANGIOGRAM (N/A) as a surgical intervention .  The patient's history has been reviewed, patient examined, no change in status, stable for surgery.  I have reviewed the patient's chart and labs.  Questions were answered to the patient's satisfaction.     Ralph [name]

## 2016-07-28 NOTE — Progress Notes (Signed)
Patient placed on CPAP using FFM with 5L 02 bleed in as per patient's home regimen.  Home settings were unknown to patient and 10 cmH20 was used.  Patient tolerated well and is familiar with equipment and procedure.

## 2016-07-28 NOTE — Transfer of Care (Signed)
Immediate Anesthesia Transfer of Care Note  Patient: [name]  Procedure(s) Performed: Procedure(s): LAPAROSCOPIC CHOLECYSTECTOMY (N/A)  Patient Location: PACU  Anesthesia Type:General  Level of Consciousness: awake, alert  and oriented  Airway & Oxygen Therapy: Patient Spontanous Breathing and Patient connected to face mask oxygen  Post-op Assessment: Report given to RN, Post -op Vital signs reviewed and stable and Patient moving all extremities X 4  Post vital signs: Reviewed and stable  Last Vitals:  Vitals:   07/28/16 0200 07/28/16 0541  BP:  128/66  Pulse:  (!) 101  Resp:  17  Temp: 37.2 C 37.2 C    Last Pain:  Vitals:   07/28/16 0541  TempSrc: Oral  PainSc:       Patients Stated Pain Goal: 2 (07/27/16 2355)  Complications: No apparent anesthesia complications

## 2016-07-28 NOTE — Progress Notes (Signed)
Placed patient on CPAP for the night with oxygen set at 4lpm ° °
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
## 2016-07-28 NOTE — Op Note (Signed)
OPERATIVE REPORT  DATE OF OPERATION:  07/28/2016  PATIENT:  Ralph [name]  50 y.o. male  PRE-OPERATIVE DIAGNOSIS:  Acute cholecystitis and cholelithiasis  POST-OPERATIVE DIAGNOSIS:  Acute cholecystitis and cholelithiasis/gangrenous cholecystitis  INDICATION(S) FOR OPERATION:  Acute cholecystitis and cholelithiasis  FINDINGS:  Gangrenous, nonperforated gallbladder.  Short cystic duct, no cholangiogram performed  PROCEDURE:  Procedure(s): LAPAROSCOPIC CHOLECYSTECTOMY  SURGEON:  Surgeon(s): Jimmye NormanJames Ninamarie Keel, MD  ASSISTANOrson Slick: Bowman, RNFA  ANESTHESIA:   general  COMPLICATIONS:  None  EBL: <30 ml  BLOOD ADMINISTERED: none  DRAINS: none   SPECIMEN:  Source of Specimen:  Gallbladder, stones, and contents  COUNTS CORRECT:  YES  PROCEDURE DETAILS: The patient was taken to the operating room and placed on the table in the supine position.  After an adequate endotracheal anesthetic was administered, the patient was prepped with ChloroPrep, and then draped in the usual manner exposing the entire abdomen laterally, inferiorly and up  to the costal margins.  After a proper timeout was performed including identifying the patient and the procedure to be performed, a supraumbilical 1.5cm midline incision was made using a #15 blade.  This was taken down to the fascia which was then incised with a #15 blade.  The edges of the fascia were tented up with Kocher clamps as the preperitoneal space was penetrated with a Kelly clamp into the peritoneum.  Once this was done, a pursestring suture of 0 Vicryl was passed around the fascial opening.  This was subsequently used to secure the [facility] cannula which was passed into the peritoneal cavity.  Once the [facility] cannula was in place, carbon dioxide gas was insufflated into the peritoneal cavity up to a maximal intra-abdominal pressure of 15mm Hg.The laparoscope, with attached camera and light source, was passed into the peritoneal cavity to visualize the  direct insertion of two right upper quadrant 5mm cannulas, and a sup-xiphoid 5mm cannula.  Once all cannulas were in place, the dissection was begun.  The gallbladder was noted to be gangrenous and mottled and distended.  It was decompressed with a needle aspirator prior to retraction.  The omentum was adherent to the dome of the gallbladder and was bluntly dissected away.  Two ratcheted graspers were attached to the dome and infundibulum of the gallbladder and retracted towards the anterior abdominal wall and the right upper quadrant.  Using cautery attached to a dissecting forceps, the peritoneum overlaying the triangle of Chalot and the hepatoduodenal triangle was dissected away exposing the cystic duct and the cystic artery.  A critical window was developed between the CBD and the cystic duct The cystic artery was clipped proximally and distally then transected.  During the dissection of the gallbladder it was noted that the cystic duct was very short and stubby.  Performing a cholangiogram seemed to pose a risk of injury to the CBD; therefore, we passed on the cholangiogram.    A clip was placed on the gallbladder side of the cystic duct, then three clips were placed on the distal cystic duct  then transected between the clips.  The gallbladder was then dissected out of the hepatic bed but require enlargement of the supraumbilical fascia, opening the gallbladder to remove multifaceted stones, and an EcoPouch to remove the gallbladder.  Once the gallbladder was removed, the bed was inspected for hemostasis.  Once excellent hemostasis was obtained all gas and fluids were aspirated from above the liver, then the cannulas were removed.  The supraumbilical incision was closed using the pursestring suture which  was in place. However and additional 0 Vicryl stich had to be placed using a UR6 needle 0.25% bupivicaine was injected at all sites.  All 10mm or greater cannula sites were close using a running  subcuticular stitch of 4-0 Monocryl.  5.710mm cannula sites were closed with Dermabond only.Steri-Strips and Tagaderm were used to complete the dressings at all sites.  At this point all needle, sponge, and instrument counts were correct.The patient was awakened from anesthesia and taken to the PACU in stable condition.  [name], [name], MD, FACS [phone]--pager [phone]--office [facility]  PATIENT DISPOSITION:  PACU - hemodynamically stable.   Carine Nordgren 12/25/201712:21 PM

## 2016-07-29 ENCOUNTER — Encounter (HOSPITAL_COMMUNITY): Payer: Self-pay | Admitting: General Surgery

## 2016-07-29 LAB — COMPREHENSIVE METABOLIC PANEL
ALBUMIN: 2.8 g/dL — AB (ref 3.5–5.0)
ALK PHOS: 73 U/L (ref 38–126)
ALT: 85 U/L — ABNORMAL HIGH (ref 17–63)
AST: 64 U/L — AB (ref 15–41)
Anion gap: 6 (ref 5–15)
BILIRUBIN TOTAL: 0.9 mg/dL (ref 0.3–1.2)
BUN: 9 mg/dL (ref 6–20)
CALCIUM: 7.9 mg/dL — AB (ref 8.9–10.3)
CO2: 25 mmol/L (ref 22–32)
Chloride: 105 mmol/L (ref 101–111)
Creatinine, Ser: 1.16 mg/dL (ref 0.61–1.24)
GFR calc Af Amer: >60 mL/min (ref >60)
GLUCOSE: 128 mg/dL — AB (ref 65–99)
Potassium: 4.1 mmol/L (ref 3.5–5.1)
Sodium: 136 mmol/L (ref 135–145)
TOTAL PROTEIN: 6 g/dL — AB (ref 6.5–8.1)

## 2016-07-29 LAB — CBC WITH DIFFERENTIAL/PLATELET
BASOS ABS: 0 10*3/uL (ref 0.0–0.1)
BASOS PCT: 0 %
EOS ABS: 0.1 10*3/uL (ref 0.0–0.7)
EOS PCT: 1 %
HCT: 36.1 % — ABNORMAL LOW (ref 39.0–52.0)
Hemoglobin: 11.7 g/dL — ABNORMAL LOW (ref 13.0–17.0)
Lymphocytes Relative: 6 %
Lymphs Abs: 0.7 10*3/uL (ref 0.7–4.0)
MCH: 29.8 pg (ref 26.0–34.0)
MCHC: 32.4 g/dL (ref 30.0–36.0)
MCV: 92.1 fL (ref 78.0–100.0)
MONO ABS: 1.5 10*3/uL — AB (ref 0.1–1.0)
Monocytes Relative: 14 %
Neutro Abs: 8.8 10*3/uL — ABNORMAL HIGH (ref 1.7–7.7)
Neutrophils Relative %: 79 %
PLATELETS: 205 10*3/uL (ref 150–400)
RBC: 3.92 MIL/uL — ABNORMAL LOW (ref 4.22–5.81)
RDW: 14.3 % (ref 11.5–15.5)
WBC: 11.1 10*3/uL — AB (ref 4.0–10.5)

## 2016-07-29 LAB — CBC
HEMATOCRIT: 34 % — AB (ref 39.0–52.0)
Hemoglobin: 11.2 g/dL — ABNORMAL LOW (ref 13.0–17.0)
MCH: 30.3 pg (ref 26.0–34.0)
MCHC: 32.9 g/dL (ref 30.0–36.0)
MCV: 91.9 fL (ref 78.0–100.0)
PLATELETS: 137 10*3/uL — AB (ref 150–400)
RBC: 3.7 MIL/uL — ABNORMAL LOW (ref 4.22–5.81)
RDW: 14.6 % (ref 11.5–15.5)
WBC: 10.4 10*3/uL (ref 4.0–10.5)

## 2016-07-29 LAB — BASIC METABOLIC PANEL
Anion gap: 12 (ref 5–15)
BUN: 12 mg/dL (ref 6–20)
CO2: 17 mmol/L — AB (ref 22–32)
CREATININE: 1.25 mg/dL — AB (ref 0.61–1.24)
Calcium: 7.9 mg/dL — ABNORMAL LOW (ref 8.9–10.3)
Chloride: 106 mmol/L (ref 101–111)
GFR calc Af Amer: >60 mL/min (ref >60)
GFR calc non Af Amer: >60 mL/min (ref >60)
GLUCOSE: 100 mg/dL — AB (ref 65–99)
POTASSIUM: 4.1 mmol/L (ref 3.5–5.1)
Sodium: 135 mmol/L (ref 135–145)

## 2016-07-29 LAB — HEPATIC FUNCTION PANEL
ALBUMIN: 2.6 g/dL — AB (ref 3.5–5.0)
ALT: 91 U/L — AB (ref 17–63)
AST: 85 U/L — AB (ref 15–41)
Alkaline Phosphatase: 60 U/L (ref 38–126)
Bilirubin, Direct: 0.5 mg/dL (ref 0.1–0.5)
Indirect Bilirubin: 0.7 mg/dL (ref 0.3–0.9)
Total Bilirubin: 1.2 mg/dL (ref 0.3–1.2)
Total Protein: 5.3 g/dL — ABNORMAL LOW (ref 6.5–8.1)

## 2016-07-29 LAB — LACTIC ACID, PLASMA: LACTIC ACID, VENOUS: 0.6 mmol/L (ref 0.5–1.9)

## 2016-07-29 MED ORDER — SODIUM CHLORIDE 0.9 % IV BOLUS (SEPSIS)
500.0000 mL | Freq: Once | INTRAVENOUS | Status: AC
  Administered 2016-07-29: 500 mL via INTRAVENOUS

## 2016-07-29 NOTE — Progress Notes (Signed)
1 Day Post-Op   Subjective: Feels well this morning. Some soreness from the incisions. Abdominal pain is relieved. Denies nausea. Tolerating diet.  Objective: Vital signs in last 24 hours: Temp:  [98.4 F (36.9 C)-102.3 F (39.1 C)] 98.4 F (36.9 C) (12/26 0803) Pulse Rate:  [91-108] 97 (12/26 0500) Resp:  [9-22] 18 (12/26 0500) BP: (85-114)/(49-74) 114/64 (12/26 0500) SpO2:  [91 %-96 %] 96 % (12/26 0500) Last BM Date: 07/27/16  Intake/Output from previous day: 12/25 0701 - 12/26 0700 In: 3717 [P.O.:480; I.V.:3237] Out: 950 [Urine:900; Blood:50] Intake/Output this shift: No intake/output data recorded.  General appearance: alert, cooperative and no distress GI: Somewhat distended and tympanitic but nontender Incision/Wound: Minimal erythema without drainage  Lab Results:   Recent Labs  07/28/16 0708 07/29/16 0532  WBC 11.9* 10.4  HGB 14.0 11.2*  HCT 43.2 34.0*  PLT 141* 137*   BMET  Recent Labs  07/28/16 0708 07/29/16 0532  NA 137 135  K 3.8 4.1  CL 104 106  CO2 23 17*  GLUCOSE 76 100*  BUN 11 12  CREATININE 1.25* 1.25*  CALCIUM 8.2* 7.9*   Hepatic Function Latest Ref Rng & Units 07/29/2016 07/28/2016 05/27/2013  Total Protein 6.5 - 8.1 g/dL 5.3(L) 6.0(L) 7.0  Albumin 3.5 - 5.0 g/dL 2.6(L) 3.3(L) 4.2  AST 15 - 41 U/L 85(H) 53(H) 23  ALT 17 - 63 U/L 91(H) 62 18  Alk Phosphatase 38 - 126 U/L 60 80 64  Total Bilirubin 0.3 - 1.2 mg/dL 1.2 3.0(H) 0.4  Bilirubin, Direct 0.1 - 0.5 mg/dL 0.5 - -    Studies/Results: US Abdomen Limited Ruq  Result Date: 07/27/2016 CLINICAL DATA:  Abdominal pain. EXAM: US ABDOMEN LIMITED - RIGHT UPPER QUADRANT COMPARISON:  None. FINDINGS: Gallbladder: The gallbladder is normally distended. Mobile sludge and gallbladder stones are seen within the the dependent portion of the gallbladder. There is prominent gallbladder wall thickening with transverse measurement of measures up to 6 mm. Pericholecystic fluid is present. The  sonographer reports positive sonographic Murphy's sign. Common bile duct: Diameter: 2.4 mm Liver: No focal lesion identified. Within normal limits in parenchymal echogenicity. IMPRESSION: Cholelithiasis with evidence of acute cholecystitis. Normal caliber of the common bile duct. These results were called by telephone at the time of interpretation on 07/27/2016 at 8:41 pm to Dr. Tobie Poet , who verbally acknowledged these results. Electronically Signed   By: Fidela Salisbury M.D.   On: 07/27/2016 20:41    Anti-infectives: Anti-infectives    Start     Dose/Rate Route Frequency Ordered Stop   07/27/16 2200  piperacillin-tazobactam (ZOSYN) IVPB 3.375 g     3.375 g 12.5 mL/hr over 240 Minutes Intravenous Every 8 hours 07/27/16 1844     07/27/16 1830  piperacillin-tazobactam (ZOSYN) IVPB 3.375 g  Status:  Discontinued     3.375 g 100 mL/hr over 30 Minutes Intravenous  Once 07/27/16 1827 07/27/16 1912      Assessment/Plan: s/p Procedure(s): LAPAROSCOPIC CHOLECYSTECTOMY Severe gangrenous cholecystitis.  Episode of fever and brief hypotension last night now resolved. Leukocytosis resolved and LFTs improved. I think he is generally doing well. Due to the fever and hypotension last night we will continue IV antibiotics today and observe another 24 hours.    LOS: 2 days    Brittany Osier T 07/29/2016

## 2016-07-29 NOTE — Progress Notes (Addendum)
PROGRESS NOTE    Ralph [name]  ZOX:[id number] DOB: [birth year] DOA: 07/27/2016  PCP: Marylen Ponto, MD   Brief Narrative:  Ralph [name] is a 50 y.o. 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  Subjective: Some tenderness in RUQ- eating solid food without difficult. Noted to have fevers. No respiratory complaints, diarrhea or dysuria.   Assessment & Plan:   Principal Problem:   Acute cholecystitis/ elevated LFTs- now with fever and mild hypotension - s/p lap chole- febrile- no other source of infection suspected per history and exam  - gen surgery to manage  Active Problems:   Spastic quadriparesis  - baclofen, Gabapentin, Nortriptyline  Mild AKI - due to hypotension? Sugery? Sepsis? - he appears adequately hydrated- follow    OSA on CPAP - CPAP    Hypotension with h/o Essential hypertension - norvasc and Toprol with holding parameters  DVT prophylaxis: SCDs Code Status: Full code Family Communication:  Disposition Plan: per gen surgery Consultants:   gen surgery Procedures:   Lap chole today Antimicrobials:  Anti-infectives    Start     Dose/Rate Route Frequency Ordered Stop   07/27/16 2200  piperacillin-tazobactam (ZOSYN) IVPB 3.375 g     3.375 g 12.5 mL/hr over 240 Minutes Intravenous Every 8 hours 07/27/16 1844     07/27/16 1830  piperacillin-tazobactam (ZOSYN) IVPB 3.375 g  Status:  Discontinued     3.375 g 100 mL/hr over 30 Minutes Intravenous  Once 07/27/16 1827 07/27/16 1912       Objective: Vitals:   07/29/16 0500 07/29/16 0803 07/29/16 1100 07/29/16 1243  BP: 114/64   (!) 116/58  Pulse: 97   100  Resp: 18   18  Temp: (!) 102.2 F (39 C) 98.4 F (36.9 C) 99.4 F (37.4 C) 100.3 F (37.9 C)  TempSrc: Oral Oral Oral Oral  SpO2: 96%   93%  Weight:       Height:        Intake/Output Summary (Last 24 hours) at 07/29/16 1550 Last data filed at 07/29/16 1300  Gross per 24 hour  Intake             1977 ml  Output             1000 ml  Net              977 ml   Filed Weights   07/27/16 1806 07/28/16 0012  Weight: 99.8 kg (220 lb) 108.2 kg (238 lb 8.6 oz)    Examination: General exam: Appears comfortable  HEENT: PERRLA, oral mucosa moist, no sclera icterus or thrush Respiratory system: Clear to auscultation. Respiratory effort normal. Cardiovascular system: S1 & S2 heard, RRR.  No murmurs  Gastrointestinal system: Abdomen soft,  tender in RUQ,  Distended and tympanic today. Normal bowel sounds. No organomegaly Extremities: No cyanosis, clubbing or edema Skin: No rashes or ulcers Psychiatry:  Mood & affect appropriate.     Data Reviewed: I have personally reviewed following labs and imaging studies  CBC:  Recent Labs Lab 07/28/16 0708 07/29/16 0532  WBC 11.9* 10.4  HGB 14.0 11.2*  HCT 43.2 34.0*  MCV 91.7 91.9  PLT 141* 137*   Basic Metabolic Panel:  Recent Labs Lab 07/28/16 0708 07/29/16 0532  NA 137 135  K 3.8 4.1  CL 104 106  CO2 23 17*  GLUCOSE 76 100*  BUN 11 12  CREATININE 1.25* 1.25*  CALCIUM 8.2* 7.9*   GFR: Estimated Creatinine Clearance: 92.2 mL/min (by C-G formula based on SCr of 1.25 mg/dL (H)). Liver Function Tests:  Recent Labs Lab 07/28/16 0708 07/29/16 0619  AST 53* 85*  ALT 62 91*  ALKPHOS 80 60  BILITOT 3.0* 1.2  PROT 6.0* 5.3*  ALBUMIN 3.3* 2.6*   No results for input(s): LIPASE, AMYLASE in the last 168 hours. No results for input(s): AMMONIA in the last 168 hours. Coagulation Profile: No results for input(s): INR, PROTIME in the last 168 hours. Cardiac Enzymes: No results for input(s): CKTOTAL, CKMB, CKMBINDEX, TROPONINI in the last 168 hours. BNP (last 3 results) No results for input(s): PROBNP in the last 8760 hours. HbA1C: No results for input(s): HGBA1C in the last  72 hours. CBG: No results for input(s): GLUCAP in the last 168 hours. Lipid Profile: No results for input(s): CHOL, HDL, LDLCALC, TRIG, CHOLHDL, LDLDIRECT in the last 72 hours. Thyroid Function Tests: No results for input(s): TSH, T4TOTAL, FREET4, T3FREE, THYROIDAB in the last 72 hours. Anemia Panel: No results for input(s): VITAMINB12, FOLATE, FERRITIN, TIBC, IRON, RETICCTPCT in the last 72 hours. Urine analysis:    Component Value Date/Time   COLORURINE AMBER (A) 07/27/2016 1825   APPEARANCEUR HAZY (A) 07/27/2016 1825   LABSPEC 1.019 07/27/2016 1825   PHURINE 5.0 07/27/2016 1825   GLUCOSEU NEGATIVE 07/27/2016 1825   HGBUR LARGE (A) 07/27/2016 1825   BILIRUBINUR NEGATIVE 07/27/2016 1825   KETONESUR NEGATIVE 07/27/2016 1825   PROTEINUR 30 (A) 07/27/2016 1825   UROBILINOGEN 1.0 08/30/2013 0118   NITRITE NEGATIVE 07/27/2016 1825   LEUKOCYTESUR NEGATIVE 07/27/2016 1825   Sepsis Labs: @LABRCNTIP (procalcitonin:4,lacticidven:4) ) Recent Results (from the past 240 hour(s))  Urine culture     Status: None   Collection Time: 07/27/16  6:25 PM  Result Value Ref Range Status   Specimen Description URINE, RANDOM  Final   Special Requests NONE  Final   Culture NO GROWTH  Final   Report Status 07/28/2016 FINAL  Final  Surgical pcr screen     Status: None   Collection Time: 07/28/16  9:55 AM  Result Value Ref Range Status   MRSA, PCR NEGATIVE NEGATIVE Final   Staphylococcus aureus NEGATIVE NEGATIVE Final    Comment:        The Xpert SA Assay (FDA approved for NASAL specimens in patients over 49 years of age), is one component of a comprehensive surveillance program.  Test performance has been validated by [facility] for patients greater than or equal to 49 year old. It is not intended to diagnose infection nor to guide or monitor treatment.          Radiology Studies: [place] Abdomen Limited Ruq  Result Date: 07/27/2016 CLINICAL DATA:  Abdominal pain. EXAM: US ABDOMEN  LIMITED - RIGHT UPPER QUADRANT COMPARISON:  None. FINDINGS: Gallbladder: The gallbladder is normally distended. Mobile sludge and gallbladder stones are seen within the the dependent portion of the gallbladder. There is prominent gallbladder wall thickening with transverse measurement of measures up to 6 mm. Pericholecystic fluid is present. The sonographer reports positive sonographic Murphy's sign. Common bile duct: Diameter: 2.4 mm Liver: No focal lesion identified. Within normal limits in parenchymal echogenicity. IMPRESSION: Cholelithiasis with evidence of acute cholecystitis. Normal caliber of the common bile duct. These results were called by telephone at the time of interpretation on 07/27/2016 at 8:41  pm to Dr. Dwana MelenaOBIN DONG , who verbally acknowledged these results. Electronically Signed   By: Ted Mcalpineobrinka  Dimitrova M.D.   On: 07/27/2016 20:41      Scheduled Meds: . amLODipine  5 mg Oral QHS  . gabapentin  800 mg Oral TID  . metoprolol succinate  100 mg Oral Daily  . nortriptyline  150 mg Oral QHS  . pantoprazole  80 mg Oral Daily  . piperacillin-tazobactam (ZOSYN)  IV  3.375 g Intravenous Q8H  . potassium chloride SA  20 mEq Oral BID   Continuous Infusions: . sodium chloride 75 mL/hr at 07/29/16 1427     LOS: 2 days    Time spent in minutes: 35    Jamecia Lerman, MD [facility] Pager: [email or web address] Password [email or web address] 07/29/2016, 3:50 PM

## 2016-07-29 NOTE — Progress Notes (Signed)
Pt temperature tonight is 102.1. Ralph PoagK. Kirby, NP notified and ordered stat lab works and and 500cc NS bolus. Will carry out and continue to monitor pt.

## 2016-07-30 DIAGNOSIS — K810 Acute cholecystitis: Secondary | ICD-10-CM

## 2016-07-30 LAB — COMPREHENSIVE METABOLIC PANEL
ALK PHOS: 67 U/L (ref 38–126)
ALT: 77 U/L — AB (ref 17–63)
ANION GAP: 4 — AB (ref 5–15)
AST: 50 U/L — ABNORMAL HIGH (ref 15–41)
Albumin: 2.5 g/dL — ABNORMAL LOW (ref 3.5–5.0)
BILIRUBIN TOTAL: 0.7 mg/dL (ref 0.3–1.2)
BUN: 7 mg/dL (ref 6–20)
CALCIUM: 8 mg/dL — AB (ref 8.9–10.3)
CO2: 30 mmol/L (ref 22–32)
CREATININE: 0.87 mg/dL (ref 0.61–1.24)
Chloride: 105 mmol/L (ref 101–111)
GFR calc non Af Amer: >60 mL/min (ref >60)
GLUCOSE: 112 mg/dL — AB (ref 65–99)
Potassium: 3.9 mmol/L (ref 3.5–5.1)
Sodium: 139 mmol/L (ref 135–145)
TOTAL PROTEIN: 5.6 g/dL — AB (ref 6.5–8.1)

## 2016-07-30 MED ORDER — METRONIDAZOLE 500 MG PO TABS
500.0000 mg | ORAL_TABLET | Freq: Three times a day (TID) | ORAL | Status: DC
  Administered 2016-07-30 – 2016-07-31 (×2): 500 mg via ORAL
  Filled 2016-07-30 (×2): qty 1

## 2016-07-30 MED ORDER — CIPROFLOXACIN HCL 500 MG PO TABS
500.0000 mg | ORAL_TABLET | Freq: Two times a day (BID) | ORAL | Status: DC
  Administered 2016-07-30 – 2016-07-31 (×2): 500 mg via ORAL
  Filled 2016-07-30 (×2): qty 1

## 2016-07-30 MED ORDER — PROMETHAZINE HCL 25 MG/ML IJ SOLN
12.5000 mg | Freq: Four times a day (QID) | INTRAMUSCULAR | Status: DC | PRN
  Administered 2016-07-30: 12.5 mg via INTRAVENOUS
  Filled 2016-07-30: qty 1

## 2016-07-30 NOTE — Progress Notes (Signed)
PROGRESS NOTE    Ralph [name]  [name] DOB: July 01, 1966 DOA: 07/27/2016  PCP: Marylen Ponto, MD   Brief Narrative:  Ralph [name] is a 50 y.o. 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 Under went lap chole 12/25  Subjective: Fever of 102 overnight  Assessment & Plan:   Principal Problem:   Acute cholecystitis - s/p lap chole 12/25-severe gangrenous cholecystitis per OR notes - febrile again, on IV Zosyn, LFTS/bili improving - no other source of infection suspected per history and exam  - per CCS, tolerating diet    Spastic quadriparesis  - baclofen, Gabapentin, Nortriptyline    Mild AKI - due to hypotension? Sugery? Sepsis? - resolved    OSA on CPAP - CPAP   Essential hypertension - continue norvasc and Toprol   DVT prophylaxis: SCDs Code Status: Full code Family Communication: family at bedside Disposition Plan: Dc home per gen surgery Consultants: gen surgery  Procedures:   Lap chole today  Antimicrobials:  Anti-infectives    Start     Dose/Rate Route Frequency Ordered Stop   07/27/16 2200  piperacillin-tazobactam (ZOSYN) IVPB 3.375 g     3.375 g 12.5 mL/hr over 240 Minutes Intravenous Every 8 hours 07/27/16 1844     07/27/16 1830  piperacillin-tazobactam (ZOSYN) IVPB 3.375 g  Status:  Discontinued     3.375 g 100 mL/hr over 30 Minutes Intravenous  Once 07/27/16 1827 07/27/16 1912       Objective: Vitals:   07/29/16 2115 07/29/16 2300 07/30/16 0058 07/30/16 0537  BP:   135/80 (!) 141/77  Pulse: 100  (!) 105 98  Resp: 18  19 18   Temp:  100 F (37.8 C) 99.3 F (37.4 C) 98.5 F (36.9 C)  TempSrc:   Oral Oral  SpO2: 94%  93% 95%  Weight:      Height:        Intake/Output Summary (Last 24 hours) at 07/30/16 1333 Last data filed at 07/30/16  1240  Gross per 24 hour  Intake          3921.67 ml  Output             3625 ml  Net           296.67 ml   Filed Weights   07/27/16 1806 07/28/16 0012  Weight: 99.8 kg (220 lb) 108.2 kg (238 lb 8.6 oz)    Examination: General exam: Appears comfortable, AAOx3 HEENT: PERRLA, oral mucosa moist, no sclera icterus or thrush Respiratory system: Clear to auscultation. Respiratory effort normal. Cardiovascular system: S1 & S2 heard, RRR.  No murmurs  Gastrointestinal system: Abdomen soft,  Mildly distended, expected tenderness, Normal bowel sounds. No organomegaly Extremities: No cyanosis, clubbing or edema Skin: No rashes or ulcers Psychiatry:  Mood & affect appropriate.     Data Reviewed: I have personally reviewed following labs and imaging studies  CBC:  Recent Labs Lab 07/28/16 0708 07/29/16 0532 07/29/16 2234  WBC 11.9* 10.4 11.1*  NEUTROABS  --   --  8.8*  HGB 14.0 11.2* 11.7*  HCT 43.2 34.0* 36.1*  MCV 91.7 91.9 92.1  PLT 141* 137* 205   Basic Metabolic Panel:  Recent Labs Lab 07/28/16 0708 07/29/16 0532 07/29/16 2234 07/30/16 0900  NA 137 135 136 139  K 3.8 4.1 4.1 3.9  CL 104 106 105 105  CO2 23 17* 25 30  GLUCOSE 76 100* 128* 112*  BUN 11 12 9 7   CREATININE 1.25* 1.25* 1.16 0.87  CALCIUM 8.2* 7.9* 7.9* 8.0*   GFR: Estimated Creatinine Clearance: 132.5 mL/min (by C-G formula based on SCr of 0.87 mg/dL). Liver Function Tests:  Recent Labs Lab 07/28/16 0708 07/29/16 0619 07/29/16 2234 07/30/16 0900  AST 53* 85* 64* 50*  ALT 62 91* 85* 77*  ALKPHOS 80 60 73 67  BILITOT 3.0* 1.2 0.9 0.7  PROT 6.0* 5.3* 6.0* 5.6*  ALBUMIN 3.3* 2.6* 2.8* 2.5*   No results for input(s): LIPASE, AMYLASE in the last 168 hours. No results for input(s): AMMONIA in the last 168 hours. Coagulation Profile: No results for input(s): INR, PROTIME in the last 168 hours. Cardiac Enzymes: No results for input(s): CKTOTAL, CKMB, CKMBINDEX, TROPONINI in the last 168  hours. BNP (last 3 results) No results for input(s): PROBNP in the last 8760 hours. HbA1C: No results for input(s): HGBA1C in the last 72 hours. CBG: No results for input(s): GLUCAP in the last 168 hours. Lipid Profile: No results for input(s): CHOL, HDL, LDLCALC, TRIG, CHOLHDL, LDLDIRECT in the last 72 hours. Thyroid Function Tests: No results for input(s): TSH, T4TOTAL, FREET4, T3FREE, THYROIDAB in the last 72 hours. Anemia Panel: No results for input(s): VITAMINB12, FOLATE, FERRITIN, TIBC, IRON, RETICCTPCT in the last 72 hours. Urine analysis:    Component Value Date/Time   COLORURINE AMBER (A) 07/27/2016 1825   APPEARANCEUR HAZY (A) 07/27/2016 1825   LABSPEC 1.019 07/27/2016 1825   PHURINE 5.0 07/27/2016 1825   GLUCOSEU NEGATIVE 07/27/2016 1825   HGBUR LARGE (A) 07/27/2016 1825   BILIRUBINUR NEGATIVE 07/27/2016 1825   KETONESUR NEGATIVE 07/27/2016 1825   PROTEINUR 30 (A) 07/27/2016 1825   UROBILINOGEN 1.0 08/30/2013 0118   NITRITE NEGATIVE 07/27/2016 1825   LEUKOCYTESUR NEGATIVE 07/27/2016 1825   Sepsis Labs: @LABRCNTIP (procalcitonin:4,lacticidven:4) ) Recent Results (from the past 240 hour(s))  Urine culture     Status: None   Collection Time: 07/27/16  6:25 PM  Result Value Ref Range Status   Specimen Description URINE, RANDOM  Final   Special Requests NONE  Final   Culture NO GROWTH  Final   Report Status 07/28/2016 FINAL  Final  Surgical pcr screen     Status: None   Collection Time: 07/28/16  9:55 AM  Result Value Ref Range Status   MRSA, PCR NEGATIVE NEGATIVE Final   Staphylococcus aureus NEGATIVE NEGATIVE Final    Comment:        The Xpert SA Assay (FDA approved for NASAL specimens in patients over 49 years of age), is one component of a comprehensive surveillance program.  Test performance has been validated by [facility] for patients greater than or equal to 49 year old. It is not intended to diagnose infection nor to guide or monitor treatment.           Radiology Studies: No results found.    Scheduled Meds: . amLODipine  5 mg Oral QHS  . gabapentin  800 mg Oral TID  . metoprolol succinate  100 mg Oral Daily  . nortriptyline  150 mg Oral QHS  . pantoprazole  80 mg Oral Daily  . piperacillin-tazobactam (ZOSYN)  IV  3.375 g Intravenous Q8H  . potassium chloride SA  20 mEq Oral BID   Continuous Infusions: . sodium chloride 75 mL/hr at 07/30/16 1326     LOS: 3 days  Time spent: 35min    Athene Schuhmacher, MD [facility] Pager: [email or web address] Password [email or web address] 07/30/2016, 1:33 PM

## 2016-07-30 NOTE — Progress Notes (Signed)
[facility] Progress Note  2 Days Post-Op  Subjective: Pt very tired today. Wife at bedside states pt did not sleep well last night was up urinating multiple times. Pt states he is nauseated only when he is up walking. No vomiting. Minimal abdominal pain. Pt is having flatus and eating well. Wife concerned about fevers. Wife states pt has been complaining of neck pain and headache. Pt was asleep and did not comment on the headache.   Objective: Vital signs in last 24 hours: Temp:  [98.5 F (36.9 C)-102.1 F (38.9 C)] 98.5 F (36.9 C) (12/27 0537) Pulse Rate:  [98-112] 98 (12/27 0537) Resp:  [18-19] 18 (12/27 0537) BP: (116-143)/(58-80) 141/77 (12/27 0537) SpO2:  [93 %-95 %] 95 % (12/27 0537) Last BM Date: 07/27/16  Intake/Output from previous day: 12/26 0701 - 12/27 0700 In: 3821.7 [P.O.:1015; I.V.:2056.7; IV Piggyback:750] Out: 3025 [Urine:3025] Intake/Output this shift: No intake/output data recorded.  PE: Gen:  Alert, NAD, pleasant, cooperative, sleeping in bed Card:  RRR, no M/G/R heard Pulm:  CTA, no W/R/R, effort normal Abd: Soft, ND, +BS, tender around incisions. incisions C/D/I, incision around umbilicus has surrounding blanching erythema, no drainage noted, no red streaks, no increased warmth or other signs of infection.  Skin: no rashes noted, warm and dry  Lab Results:   Recent Labs  07/29/16 0532 07/29/16 2234  WBC 10.4 11.1*  HGB 11.2* 11.7*  HCT 34.0* 36.1*  PLT 137* 205   BMET  Recent Labs  07/29/16 0532 07/29/16 2234  NA 135 136  K 4.1 4.1  CL 106 105  CO2 17* 25  GLUCOSE 100* 128*  BUN 12 9  CREATININE 1.25* 1.16  CALCIUM 7.9* 7.9*   PT/INR No results for input(s): LABPROT, INR in the last 72 hours. CMP     Component Value Date/Time   NA 136 07/29/2016 2234   K 4.1 07/29/2016 2234   CL 105 07/29/2016 2234   CO2 25 07/29/2016 2234   GLUCOSE 128 (H) 07/29/2016 2234   BUN 9 07/29/2016 2234   CREATININE 1.16 07/29/2016  2234   CALCIUM 7.9 (L) 07/29/2016 2234   PROT 6.0 (L) 07/29/2016 2234   ALBUMIN 2.8 (L) 07/29/2016 2234   AST 64 (H) 07/29/2016 2234   ALT 85 (H) 07/29/2016 2234   ALKPHOS 73 07/29/2016 2234   BILITOT 0.9 07/29/2016 2234   GFRNONAA >60 07/29/2016 2234   GFRAA >60 07/29/2016 2234   Lipase  No results found for: LIPASE     Studies/Results: No results found.  Anti-infectives: Anti-infectives    Start     Dose/Rate Route Frequency Ordered Stop   07/27/16 2200  piperacillin-tazobactam (ZOSYN) IVPB 3.375 g     3.375 g 12.5 mL/hr over 240 Minutes Intravenous Every 8 hours 07/27/16 1844     07/27/16 1830  piperacillin-tazobactam (ZOSYN) IVPB 3.375 g  Status:  Discontinued     3.375 g 100 mL/hr over 30 Minutes Intravenous  Once 07/27/16 1827 07/27/16 1912       Assessment/Plan  s/p Procedure(s): LAPAROSCOPIC CHOLECYSTECTOMY, 12/25, Dr. Lindie SpruceWyatt Severe gangrenous cholecystitis.  Episode of fever and tachycardia last night now resolved. Leukocytosis slightly elevated. LFTs improving. Due to the fever and tachycardia last night we will continue IV antibiotics today and observe another 24 hours. AM labs. Will monitor umbilical incision. Appears more like a local reaction vs cellulitis.   ID: Zosyn 12/24>>   LOS: 3 days    Jerre SimonJessica L Debbi Strandberg , [facility] LPA-C [facility] 07/30/2016,  9:28 AM Pager: [phone] Consults: [phone] Mon-Fri 7:00 am-4:30 pm Sat-Sun 7:00 am-11:30 am

## 2016-07-30 NOTE — Progress Notes (Signed)
Pharmacy Antibiotic Note  Ralph [name] is a 50 y.o. male admitted on 07/27/2016 with intra-abdominal infection. Pharmacy has been consulted for Zosyn dosing.  Day #4 of abx for suspected cholecystitis. Sent here from [place] d/t no imaging on the weekend. Abd pain improved after lap-chole yest, gangrenous. Tmax of 102.1 yesterday, WBC 11.1.  Plan: Continue Zosyn 3.375 gm IV q8h (4 hour infusion) Monitor clinical picture, renal function F/U C&S, abx deescalation / LOT  Height: 6\' 1"  (185.4 cm) Weight: 238 lb 8.6 oz (108.2 kg) IBW/kg (Calculated) : 79.9  Temp (24hrs), Avg:99.9 F (37.7 C), Min:98.5 F (36.9 C), Max:102.1 F (38.9 C)   Recent Labs Lab 07/27/16 1926 07/28/16 0708 07/29/16 0532 07/29/16 2234 07/30/16 0900  WBC  --  11.9* 10.4 11.1*  --   CREATININE  --  1.25* 1.25* 1.16 0.87  LATICACIDVEN 1.01  --   --  0.6  --     Estimated Creatinine Clearance: 132.5 mL/min (by C-G formula based on SCr of 0.87 mg/dL).    No Known Allergies  Antimicrobials this admission:  Zosyn 12/24 >>   Dose adjustments this admission:  N/a  Microbiology results:  12/26 BCx: sent 12/24 UCx: ngF    Thank you for allowing pharmacy to be a part of this patient's care.  Enzo BiNathan Buffi Ewton, PharmD, BCPS Clinical Pharmacist Pager [phone] 07/30/2016 12:36 PM

## 2016-07-31 LAB — COMPREHENSIVE METABOLIC PANEL
ALBUMIN: 2.5 g/dL — AB (ref 3.5–5.0)
ALT: 65 U/L — ABNORMAL HIGH (ref 17–63)
ANION GAP: 9 (ref 5–15)
AST: 35 U/L (ref 15–41)
Alkaline Phosphatase: 69 U/L (ref 38–126)
BUN: 6 mg/dL (ref 6–20)
CHLORIDE: 103 mmol/L (ref 101–111)
CO2: 27 mmol/L (ref 22–32)
Calcium: 8.2 mg/dL — ABNORMAL LOW (ref 8.9–10.3)
Creatinine, Ser: 0.75 mg/dL (ref 0.61–1.24)
GFR calc Af Amer: >60 mL/min (ref >60)
GFR calc non Af Amer: >60 mL/min (ref >60)
Glucose, Bld: 104 mg/dL — ABNORMAL HIGH (ref 65–99)
POTASSIUM: 3.8 mmol/L (ref 3.5–5.1)
Sodium: 139 mmol/L (ref 135–145)
TOTAL PROTEIN: 5.8 g/dL — AB (ref 6.5–8.1)
Total Bilirubin: 0.8 mg/dL (ref 0.3–1.2)

## 2016-07-31 LAB — CBC
HCT: 38.3 % — ABNORMAL LOW (ref 39.0–52.0)
Hemoglobin: 12.7 g/dL — ABNORMAL LOW (ref 13.0–17.0)
MCH: 29.7 pg (ref 26.0–34.0)
MCHC: 33.2 g/dL (ref 30.0–36.0)
MCV: 89.7 fL (ref 78.0–100.0)
PLATELETS: 224 10*3/uL (ref 150–400)
RBC: 4.27 MIL/uL (ref 4.22–5.81)
RDW: 13.6 % (ref 11.5–15.5)
WBC: 7.7 10*3/uL (ref 4.0–10.5)

## 2016-07-31 MED ORDER — PROMETHAZINE HCL 25 MG PO TABS
12.5000 mg | ORAL_TABLET | Freq: Four times a day (QID) | ORAL | Status: DC | PRN
  Administered 2016-07-31: 12.5 mg via ORAL
  Filled 2016-07-31: qty 1

## 2016-07-31 MED ORDER — AMOXICILLIN-POT CLAVULANATE 875-125 MG PO TABS: 1.0000 | ORAL_TABLET | Freq: Two times a day (BID) | ORAL | 0 refills | Status: DC

## 2016-07-31 MED ORDER — OXYCODONE HCL 5 MG PO TABS: 5.0000 mg | ORAL_TABLET | Freq: Four times a day (QID) | ORAL | 0 refills | Status: DC | PRN

## 2016-07-31 NOTE — Discharge Instructions (Signed)
[facility] ______[facility] LAPAROSCOPIC SURGERY: POST OP INSTRUCTIONS Call if you have any issues with the abdominal wall incisions. Always review your discharge instruction sheet given to you by the facility where your surgery was performed. IF YOU HAVE DISABILITY OR FAMILY LEAVE FORMS, YOU MUST BRING THEM TO THE OFFICE FOR PROCESSING.   DO NOT GIVE THEM TO YOUR DOCTOR.  1. A prescription for pain medication may be given to you upon discharge.  Take your pain medication as prescribed, if needed.  If narcotic pain medicine is not needed, then you may take acetaminophen (Tylenol) or ibuprofen (Advil) as needed. 2. Take your usually prescribed medications unless otherwise directed. 3. If you need a refill on your pain medication, please contact your pharmacy.  They will contact our office to request authorization. Prescriptions will not be filled after 5pm or on week-ends. 4. You should follow a light diet the first few days after arrival home, such as soup and crackers, etc.  Be sure to include lots of fluids daily. 5. Most patients will experience some swelling and bruising in the area of the incisions.  Ice packs will help.  Swelling and bruising can take several days to resolve.  6. It is common to experience some constipation if taking pain medication after surgery.  Increasing fluid intake and taking a stool softener (such as Colace) will usually help or prevent this problem from occurring.  A mild laxative (Milk of Magnesia or Miralax) should be taken according to package instructions if there are no bowel movements after 48 hours. 7. Unless discharge instructions indicate otherwise, you may remove your bandages 24-48 hours after surgery, and you may shower at that time.  You may have steri-strips (small skin tapes) in place directly over the incision.  These strips should be left on the skin for 7-10 days.  If your surgeon used skin glue on the incision, you may shower in 24 hours.   The glue will flake off over the next 2-3 weeks.  Any sutures or staples will be removed at the office during your follow-up visit. 8. ACTIVITIES:  You may resume regular (light) daily activities beginning the next day--such as daily self-care, walking, climbing stairs--gradually increasing activities as tolerated.  You may have sexual intercourse when it is comfortable.  Refrain from any heavy lifting or straining until approved by your doctor. a. You may drive when you are no longer taking prescription pain medication, you can comfortably wear a seatbelt, and you can safely maneuver your car and apply brakes. b. RETURN TO WORK:  __________________________________________________________ 9. You should see your doctor in the office for a follow-up appointment approximately 2-3 weeks after your surgery.  Make sure that you call for this appointment within a day or two after you arrive home to insure a convenient appointment time. 10. OTHER INSTRUCTIONS: __________________________________________________________________________________________________________________________ __________________________________________________________________________________________________________________________ WHEN TO CALL YOUR DOCTOR: 1. Fever over 101.0 2. Inability to urinate 3. Continued bleeding from incision. 4. Increased pain, redness, or drainage from the incision. 5. Increasing abdominal pain  The clinic staff is available to answer your questions during regular business hours.  Please dont hesitate to call and ask to speak to one of the nurses for clinical concerns.  If you have a medical emergency, go to the nearest emergency room or call 911.  A surgeon from [facility] is always on call at the hospital. [address], [address], [place], [place]  [address] ? [address] [address], [place], [place]  [id number] [phone] ? [phone] ? FAX [phone] Web site:  [email or web address]  Laparoscopic Cholecystectomy, Care After This sheet gives you information about how to care for yourself after your procedure. Your health care provider may also give you more specific instructions. If you have problems or questions, contact your health care provider. What can I expect after the procedure? After the procedure, it is common to have:  Pain at your incision sites. You will be given medicines to control this pain.  Mild nausea or vomiting.  Bloating and possible shoulder pain from the air-like gas that was used during the procedure. Follow these instructions at home: Incision care  Follow instructions from your health care provider about how to take care of your incisions. Make sure you:  Wash your hands with soap and water before you change your bandage (dressing). If soap and water are not available, use hand sanitizer.  Change your dressing as told by your health care provider.  Leave stitches (sutures), skin glue, or adhesive strips in place. These skin closures may need to be in place for 2 weeks or longer. If adhesive strip edges start to loosen and curl up, you may trim the loose edges. Do not remove adhesive strips completely unless your health care provider tells you to do that.  Do not take baths, swim, or use a hot tub until your health care provider approves. Ask your health care provider if you can take showers. You may only be allowed to take sponge baths for bathing.  Check your incision area every day for signs of infection. Check for:  More redness, swelling, or pain.  More fluid or blood.  Warmth.  Pus or a bad smell. Activity  Do not drive or use heavy machinery while taking prescription pain medicine.  Do not lift anything that is heavier than 10 lb (4.5 kg) until your health care provider approves.  Do not play contact sports until your health care provider approves.  Do not drive for 24 hours if you were  given a medicine to help you relax (sedative).  Rest as needed. Do not return to work or school until your health care provider approves. General instructions  Take over-the-counter and prescription medicines only as told by your health care provider.  To prevent or treat constipation while you are taking prescription pain medicine, your health care provider may recommend that you:  Drink enough fluid to keep your urine clear or pale yellow.  Take over-the-counter or prescription medicines.  Eat foods that are high in fiber, such as fresh fruits and vegetables, whole grains, and beans.  Limit foods that are high in fat and processed sugars, such as fried and sweet foods. Contact a health care provider if:  You develop a rash.  You have more redness, swelling, or pain around your incisions.  You have more fluid or blood coming from your incisions.  Your incisions feel warm to the touch.  You have pus or a bad smell coming from your incisions.  You have a fever.  One or more of your incisions breaks open. Get help right away if:  You have trouble breathing.  You have chest pain.  You have increasing pain in your shoulders.  You faint or feel dizzy when you stand.  You have severe pain in your abdomen.  You have nausea or vomiting that lasts for more than one day.  You have leg pain. This information is not intended to replace advice given to you  by your health care provider. Make sure you discuss any questions you have with your health care provider. Document Released: 07/21/2005 Document Revised: 02/09/2016 Document Reviewed: 01/07/2016 Elsevier Interactive Patient Education  2017 [employer].

## 2016-07-31 NOTE — Progress Notes (Signed)
3 Days Post-Op  Subjective: Abdominal wall incision is still a bit red around the main port. He is having nausea with the Flagyl and Cipro. Dr. Jomarie LongsJoseph plan switch this to Augmentin. He reports plans for back surgery scheduled in about 2 weeks. I recommended he call his surgeon to discuss timing of his surgery with him.   Objective: Vital signs in last 24 hours: Temp:  [97.6 F (36.4 C)-98.4 F (36.9 C)] 98.4 F (36.9 C) (12/28 0603) Pulse Rate:  [89-95] 95 (12/28 0603) Resp:  [18-19] 18 (12/28 0603) BP: (119-139)/(70-86) 130/86 (12/28 0603) SpO2:  [91 %-99 %] 96 % (12/28 0603) Last BM Date: 07/30/16 1100 Po 1800 urine BM x 1 Afebrile, vital signs stable WBC is normal, CMP is stable, creatinine is back to normal. Intake/Output from previous day: 12/27 0701 - 12/28 0700 In: 1100 [P.O.:1100] Out: 1800 [Urine:1800] Intake/Output this shift: Total I/O In: 220 [P.O.:220] Out: -   General appearance: alert, cooperative, no distress and Has trouble ambulating. GI: Large abdomen., Still has some erythema around the main port site. The other sites look fine.  Lab Results:   Recent Labs  07/29/16 2234 07/31/16 0349  WBC 11.1* 7.7  HGB 11.7* 12.7*  HCT 36.1* 38.3*  PLT 205 224    BMET  Recent Labs  07/30/16 0900 07/31/16 0349  NA 139 139  K 3.9 3.8  CL 105 103  CO2 30 27  GLUCOSE 112* 104*  BUN 7 6  CREATININE 0.87 0.75  CALCIUM 8.0* 8.2*   PT/INR No results for input(s): LABPROT, INR in the last 72 hours.   Recent Labs Lab 07/28/16 0708 07/29/16 0619 07/29/16 2234 07/30/16 0900 07/31/16 0349  AST 53* 85* 64* 50* 35  ALT 62 91* 85* 77* 65*  ALKPHOS 80 60 73 67 69  BILITOT 3.0* 1.2 0.9 0.7 0.8  PROT 6.0* 5.3* 6.0* 5.6* 5.8*  ALBUMIN 3.3* 2.6* 2.8* 2.5* 2.5*     Lipase  No results found for: LIPASE   Studies/Results: No results found. Prior to Admission medications   Medication Sig Start Date End Date Taking? Authorizing Provider  acetaminophen  (TYLENOL) 500 MG tablet Take 1,000 mg by mouth every 6 (six) hours as needed for headache (pain).   Yes Historical Provider, MD  amLODipine (NORVASC) 5 MG tablet Take 5 mg by mouth at bedtime. 06/21/15  Yes Historical Provider, MD  azelastine (ASTELIN) 0.1 % nasal spray Place 2 sprays into both nostrils 2 (two) times daily. Use in each nostril as directed Patient taking differently: Place 2 sprays into both nostrils 2 (two) times daily.  06/24/16  Yes Shaylar Larose HiresPatricia Padgett, MD  BACLOFEN IT by Intrathecal route continuous. Administered by Dr. Lesia SagoKeith Willis - for chronic pain and spasticity   Yes Historical Provider, MD  Eszopiclone 3 MG TABS TAKE 1 TABLET BY MOUTH IMMEDIATELY BEFORE BEDTIME 03/07/16  Yes York Spanielharles K Willis, MD  gabapentin (NEURONTIN) 800 MG tablet Take 1 tablet (800 mg total) by mouth 3 (three) times daily. 12/20/15  Yes York Spanielharles K Willis, MD  metoprolol succinate (TOPROL-XL) 100 MG 24 hr tablet Take 100 mg by mouth daily. Take with or immediately following a meal.   Yes Historical Provider, MD  nortriptyline (PAMELOR) 75 MG capsule Take 2 capsules (150 mg total) by mouth at bedtime. 10/15/15  Yes Anson FretAntonia B Ahern, MD  omeprazole (PRILOSEC) 40 MG capsule Take 40 mg by mouth daily.    Yes Historical Provider, MD  potassium chloride SA (K-DUR,KLOR-CON) 20 MEQ  tablet Take 20 mEq by mouth 2 (two) times daily.   Yes Historical Provider, MD  PRESCRIPTION MEDICATION Inhale into the lungs at bedtime. CPAP with 5 L oxygen   Yes Historical Provider, MD  tadalafil (CIALIS) 20 MG tablet Take 20 mg by mouth daily as needed for erectile dysfunction.   Yes Historical Provider, MD  TURMERIC PO Take 1 capsule by mouth daily.   Yes Historical Provider, MD  valsartan (DIOVAN) 320 MG tablet Take 320 mg by mouth daily. 07/05/16  Yes Historical Provider, MD    Medications: . amLODipine  5 mg Oral QHS  . ciprofloxacin  500 mg Oral BID  . gabapentin  800 mg Oral TID  . metoprolol succinate  100 mg Oral Daily   . metroNIDAZOLE  500 mg Oral Q8H  . nortriptyline  150 mg Oral QHS  . pantoprazole  80 mg Oral Daily  . potassium chloride SA  20 mEq Oral BID   . sodium chloride 75 mL/hr at 07/30/16 1326   Assessment/Plan Acute cholecystitis and cholelithiasis Status post laparoscopic cholecystectomy, 07/28/16, Dr. Jimmye NormanJames Wyatt (findings: Gangrenous nonperforated gallbladder with short cystic duct and cholangiogram performed) Postop hypotension and fever Hypertension Mild acute kidney injury. Obstructive sleep apnea on CPAP History of CVA/gait disorder Chronic low back pain FEN: IV fluids/soft diet ID: 4 days of Zosyn, discontinued on 07/30/16.  Flagyl and Cipro started on 07/30/16 day 2 DVT:  SCD  Plan: he has  been seen by Dr. Jomarie LongsJoseph and she plans to discharge home today. Regarding convert him to oral Augmentin in place of the Cipro/Flagyl. Our office will contact [facility] with [facility] in 2-3 weeks. I told him to call sooner if he has any problems with the incisions. I recommended he talk to his back surgeon about timing of his pending back surgery.     LOS: 4 days    Ralph [name] 07/31/2016 [phone]
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
## 2016-07-31 NOTE — Care Management Important Message (Signed)
Important Message  Patient Details  Name: Lenard ForthDarryl W Colston MRN: [id number] Date of Birth: 12-01-1965   Medicare Important Message Given:  Yes    Loni Abdon 07/31/2016, 11:18 AM

## 2016-08-03 LAB — CULTURE, BLOOD (ROUTINE X 2)
CULTURE: NO GROWTH
Culture: NO GROWTH

## 2016-08-04 DIAGNOSIS — I2782 Chronic pulmonary embolism: Secondary | ICD-10-CM | POA: Diagnosis not present

## 2016-08-04 DIAGNOSIS — R269 Unspecified abnormalities of gait and mobility: Secondary | ICD-10-CM | POA: Diagnosis not present

## 2016-08-04 DIAGNOSIS — R0602 Shortness of breath: Secondary | ICD-10-CM | POA: Diagnosis not present

## 2016-08-04 DIAGNOSIS — M4800 Spinal stenosis, site unspecified: Secondary | ICD-10-CM | POA: Diagnosis not present

## 2016-08-04 DIAGNOSIS — G4733 Obstructive sleep apnea (adult) (pediatric): Secondary | ICD-10-CM | POA: Diagnosis not present

## 2016-08-04 DIAGNOSIS — G4734 Idiopathic sleep related nonobstructive alveolar hypoventilation: Secondary | ICD-10-CM | POA: Diagnosis not present

## 2016-08-13 ENCOUNTER — Inpatient Hospital Stay: Admit: 2016-08-13 | Payer: Medicare PPO | Admitting: Orthopedic Surgery

## 2016-08-13 SURGERY — ANTERIOR LATERAL LUMBAR FUSION 1 LEVEL
Anesthesia: General | Laterality: Left

## 2016-08-14 ENCOUNTER — Inpatient Hospital Stay: Admit: 2016-08-14 | Payer: Medicare PPO | Admitting: Orthopedic Surgery

## 2016-08-14 SURGERY — POSTERIOR LUMBAR FUSION 1 LEVEL
Anesthesia: General

## 2016-08-15 ENCOUNTER — Ambulatory Visit (INDEPENDENT_AMBULATORY_CARE_PROVIDER_SITE_OTHER): Payer: Medicare PPO | Admitting: Neurology

## 2016-08-15 ENCOUNTER — Encounter: Payer: Self-pay | Admitting: Neurology

## 2016-08-15 VITALS — BP 152/95 | HR 72 | Ht 73.0 in | Wt 212.0 lb

## 2016-08-15 DIAGNOSIS — R269 Unspecified abnormalities of gait and mobility: Secondary | ICD-10-CM | POA: Diagnosis not present

## 2016-08-15 DIAGNOSIS — G4733 Obstructive sleep apnea (adult) (pediatric): Secondary | ICD-10-CM | POA: Diagnosis not present

## 2016-08-15 DIAGNOSIS — G8250 Quadriplegia, unspecified: Secondary | ICD-10-CM | POA: Diagnosis not present

## 2016-08-15 DIAGNOSIS — M4800 Spinal stenosis, site unspecified: Secondary | ICD-10-CM | POA: Diagnosis not present

## 2016-08-15 MED ORDER — TEMAZEPAM 30 MG PO CAPS
30.0000 mg | ORAL_CAPSULE | Freq: Every evening | ORAL | 0 refills | Status: DC | PRN
Start: 2016-08-15 — End: 2016-12-31

## 2016-08-15 MED ORDER — BACLOFEN 40 MG/20ML IT SOLN
40.0000 mg | Freq: Once | INTRATHECAL | Status: AC
  Administered 2016-08-15: 40 mg via INTRATHECAL

## 2016-08-15 NOTE — Procedures (Signed)
      History:  Ival BibleDarrell Burnsworth is a 51 year old gentleman with a history of a cervical myelopathy and a spastic quadriparesis. The patient returns for baclofen pump refill. He recently had a gallbladder section, his spasticity in the legs increased slightly with this, but are now returning to normal. He has not had any recent falls.   Baclofen pump refill note  The baclofen pump site was cleaned with Betadine solution. A 21-gauge needle was inserted into the pump port site. Approximately 4 cc of residual baclofen was removed, the pump indicates that 3.2 mL of fluid are present. 20 cc of replacement baclofen was placed into the pump at 2000 mcg/cc concentration.  The pump was reprogrammed for the following settings: The settings were At a basal rate of 10.2 mcg/h. From 9 to 3 AM, the rate increases to 11.5 g per hour. The total daily dose is 252.6 g.   The alarm volume is set at 2.0 cc. The next alarm date is 01/04/2017.  The patient tolerated the procedure well. There were no complications of the above procedure.  The NDC number is [phone] The baclofen expiration date is 12/2018. The baclofen lot number is [id number].

## 2016-08-25 ENCOUNTER — Other Ambulatory Visit: Payer: Self-pay

## 2016-09-04 DIAGNOSIS — M4800 Spinal stenosis, site unspecified: Secondary | ICD-10-CM | POA: Diagnosis not present

## 2016-09-04 DIAGNOSIS — R0602 Shortness of breath: Secondary | ICD-10-CM | POA: Diagnosis not present

## 2016-09-04 DIAGNOSIS — I2782 Chronic pulmonary embolism: Secondary | ICD-10-CM | POA: Diagnosis not present

## 2016-09-04 DIAGNOSIS — R269 Unspecified abnormalities of gait and mobility: Secondary | ICD-10-CM | POA: Diagnosis not present

## 2016-09-04 DIAGNOSIS — G4734 Idiopathic sleep related nonobstructive alveolar hypoventilation: Secondary | ICD-10-CM | POA: Diagnosis not present

## 2016-09-04 DIAGNOSIS — G4733 Obstructive sleep apnea (adult) (pediatric): Secondary | ICD-10-CM | POA: Diagnosis not present

## 2016-09-13 ENCOUNTER — Other Ambulatory Visit: Payer: Self-pay | Admitting: Neurology

## 2016-09-15 DIAGNOSIS — G4733 Obstructive sleep apnea (adult) (pediatric): Secondary | ICD-10-CM | POA: Diagnosis not present

## 2016-09-15 DIAGNOSIS — M4800 Spinal stenosis, site unspecified: Secondary | ICD-10-CM | POA: Diagnosis not present

## 2016-09-15 DIAGNOSIS — R269 Unspecified abnormalities of gait and mobility: Secondary | ICD-10-CM | POA: Diagnosis not present

## 2016-09-19 ENCOUNTER — Telehealth: Payer: Self-pay | Admitting: Neurology

## 2016-09-19 MED ORDER — ESZOPICLONE 3 MG PO TABS: 3.0000 mg | ORAL_TABLET | Freq: Every day | ORAL | 5 refills | Status: DC

## 2016-09-19 NOTE — Addendum Note (Signed)
Addended by: Stephanie AcreWILLIS, CHARLES on: 09/19/2016 02:07 PM   Modules accepted: Orders

## 2016-09-19 NOTE — Telephone Encounter (Signed)
A prescription was given for Lunesta.

## 2016-09-19 NOTE — Telephone Encounter (Signed)
Patient call requesting a refill on Lunesta. He was advised, that controlled substances cannot be filled after hours. I do see a prescription for Temazepam from last month. Patient also states, that Dr. Anne HahnWillis told him to be off of Lunesta to try another sleeping pill. He reports he has has not been sleeping for the past week. I advised patient that I would let Dr. Anne HahnWillis know. He was in agreement.

## 2016-09-22 NOTE — Telephone Encounter (Signed)
Faxed printed/signed rx eszopiclone to pt pharmacy. Fax: [phone]. Received confirmation.

## 2016-10-01 ENCOUNTER — Telehealth: Payer: Self-pay | Admitting: *Deleted

## 2016-10-01 NOTE — Telephone Encounter (Signed)
This patient was referred with chronic insomnia as well as sleep apnea and had been on multiple sleep aids in the past for example Lunesta, temazepam, a one-time Ambien, Pamelor, Desyrel.  I happy to help him with his apnea treatment, but I will not prescribe sleep aids continuously. He had been referred after his primary care physician didn't want to prescribe anymore.

## 2016-10-01 NOTE — Telephone Encounter (Signed)
Dr Vickey Hugerohmeier- I received fax request from pt insurance stating Alfonso Pattenlunesta is non-formulary, needing PA. You follow pt for sleep apnea. Does patient have diagnosis of insomnia?

## 2016-10-02 DIAGNOSIS — I2782 Chronic pulmonary embolism: Secondary | ICD-10-CM | POA: Diagnosis not present

## 2016-10-02 DIAGNOSIS — R269 Unspecified abnormalities of gait and mobility: Secondary | ICD-10-CM | POA: Diagnosis not present

## 2016-10-02 DIAGNOSIS — G4734 Idiopathic sleep related nonobstructive alveolar hypoventilation: Secondary | ICD-10-CM | POA: Diagnosis not present

## 2016-10-02 DIAGNOSIS — M4800 Spinal stenosis, site unspecified: Secondary | ICD-10-CM | POA: Diagnosis not present

## 2016-10-02 DIAGNOSIS — R0602 Shortness of breath: Secondary | ICD-10-CM | POA: Diagnosis not present

## 2016-10-02 DIAGNOSIS — G4733 Obstructive sleep apnea (adult) (pediatric): Secondary | ICD-10-CM | POA: Diagnosis not present

## 2016-10-06 NOTE — Telephone Encounter (Signed)
Initiated PA on covermymeds on 10/03/16. PA eszopiclone 3mg  tablet approved until 08/03/17. Key: [id number].

## 2016-10-17 ENCOUNTER — Other Ambulatory Visit: Payer: Self-pay | Admitting: Orthopedic Surgery

## 2016-10-20 DIAGNOSIS — Z1389 Encounter for screening for other disorder: Secondary | ICD-10-CM | POA: Diagnosis not present

## 2016-10-20 DIAGNOSIS — I10 Essential (primary) hypertension: Secondary | ICD-10-CM | POA: Diagnosis not present

## 2016-10-20 DIAGNOSIS — Z6832 Body mass index (BMI) 32.0-32.9, adult: Secondary | ICD-10-CM | POA: Diagnosis not present

## 2016-10-20 DIAGNOSIS — Z01818 Encounter for other preprocedural examination: Secondary | ICD-10-CM | POA: Diagnosis not present

## 2016-10-20 DIAGNOSIS — M79642 Pain in left hand: Secondary | ICD-10-CM | POA: Diagnosis not present

## 2016-10-20 DIAGNOSIS — Z0000 Encounter for general adult medical examination without abnormal findings: Secondary | ICD-10-CM | POA: Diagnosis not present

## 2016-10-22 ENCOUNTER — Other Ambulatory Visit: Payer: Self-pay | Admitting: Neurology

## 2016-11-02 DIAGNOSIS — R0602 Shortness of breath: Secondary | ICD-10-CM | POA: Diagnosis not present

## 2016-11-02 DIAGNOSIS — G4733 Obstructive sleep apnea (adult) (pediatric): Secondary | ICD-10-CM | POA: Diagnosis not present

## 2016-11-02 DIAGNOSIS — M4800 Spinal stenosis, site unspecified: Secondary | ICD-10-CM | POA: Diagnosis not present

## 2016-11-02 DIAGNOSIS — G4734 Idiopathic sleep related nonobstructive alveolar hypoventilation: Secondary | ICD-10-CM | POA: Diagnosis not present

## 2016-11-02 DIAGNOSIS — I2782 Chronic pulmonary embolism: Secondary | ICD-10-CM | POA: Diagnosis not present

## 2016-11-02 DIAGNOSIS — R269 Unspecified abnormalities of gait and mobility: Secondary | ICD-10-CM | POA: Diagnosis not present

## 2016-11-02 HISTORY — PX: BACK SURGERY: SHX140

## 2016-11-05 ENCOUNTER — Encounter (HOSPITAL_COMMUNITY)
Admission: RE | Admit: 2016-11-05 | Discharge: 2016-11-05 | Disposition: A | Discharge status: Home / Self Care | Payer: Medicare PPO | Source: Ambulatory Visit | Attending: Orthopedic Surgery | Admitting: Orthopedic Surgery

## 2016-11-05 ENCOUNTER — Encounter (HOSPITAL_COMMUNITY): Payer: Self-pay

## 2016-11-05 DIAGNOSIS — Z01812 Encounter for preprocedural laboratory examination: Secondary | ICD-10-CM | POA: Diagnosis not present

## 2016-11-05 DIAGNOSIS — M79604 Pain in right leg: Secondary | ICD-10-CM | POA: Insufficient documentation

## 2016-11-05 DIAGNOSIS — M79605 Pain in left leg: Secondary | ICD-10-CM | POA: Insufficient documentation

## 2016-11-05 HISTORY — DX: Pain in unspecified joint: M25.50

## 2016-11-05 HISTORY — DX: Personal history of colon polyps, unspecified: Z86.0100

## 2016-11-05 HISTORY — DX: Dorsalgia, unspecified: M54.9

## 2016-11-05 HISTORY — DX: Major depressive disorder, single episode, unspecified: F32.9

## 2016-11-05 HISTORY — DX: Insomnia, unspecified: G47.00

## 2016-11-05 HISTORY — DX: Personal history of colonic polyps: Z86.010

## 2016-11-05 HISTORY — DX: Effusion, unspecified joint: M25.40

## 2016-11-05 HISTORY — DX: Other chronic pain: G89.29

## 2016-11-05 HISTORY — DX: Personal history of other venous thrombosis and embolism: Z86.718

## 2016-11-05 HISTORY — DX: Depression, unspecified: F32.A

## 2016-11-05 HISTORY — DX: Restless legs syndrome: G25.81

## 2016-11-05 LAB — TYPE AND SCREEN
ABO/RH(D): O POS
ANTIBODY SCREEN: NEGATIVE

## 2016-11-05 LAB — COMPREHENSIVE METABOLIC PANEL
ALT: 25 U/L (ref 17–63)
AST: 29 U/L (ref 15–41)
Albumin: 4.3 g/dL (ref 3.5–5.0)
Alkaline Phosphatase: 83 U/L (ref 38–126)
Anion gap: 9 (ref 5–15)
BUN: 11 mg/dL (ref 6–20)
CHLORIDE: 103 mmol/L (ref 101–111)
CO2: 27 mmol/L (ref 22–32)
CREATININE: 0.95 mg/dL (ref 0.61–1.24)
Calcium: 9.3 mg/dL (ref 8.9–10.3)
GFR calc Af Amer: >60 mL/min (ref >60)
Glucose, Bld: 93 mg/dL (ref 65–99)
POTASSIUM: 3.9 mmol/L (ref 3.5–5.1)
SODIUM: 139 mmol/L (ref 135–145)
Total Bilirubin: 0.9 mg/dL (ref 0.3–1.2)
Total Protein: 6.9 g/dL (ref 6.5–8.1)

## 2016-11-05 LAB — CBC WITH DIFFERENTIAL/PLATELET
BASOS ABS: 0 10*3/uL (ref 0.0–0.1)
BASOS PCT: 1 %
EOS PCT: 2 %
Eosinophils Absolute: 0.1 10*3/uL (ref 0.0–0.7)
HCT: 44.6 % (ref 39.0–52.0)
Hemoglobin: 15.1 g/dL (ref 13.0–17.0)
Lymphocytes Relative: 23 %
Lymphs Abs: 1.4 10*3/uL (ref 0.7–4.0)
MCH: 30.1 pg (ref 26.0–34.0)
MCHC: 33.9 g/dL (ref 30.0–36.0)
MCV: 88.8 fL (ref 78.0–100.0)
MONO ABS: 0.5 10*3/uL (ref 0.1–1.0)
Monocytes Relative: 9 %
Neutro Abs: 3.9 10*3/uL (ref 1.7–7.7)
Neutrophils Relative %: 65 %
PLATELETS: 201 10*3/uL (ref 150–400)
RBC: 5.02 MIL/uL (ref 4.22–5.81)
RDW: 13.4 % (ref 11.5–15.5)
WBC: 5.9 10*3/uL (ref 4.0–10.5)

## 2016-11-05 LAB — SURGICAL PCR SCREEN
MRSA, PCR: NEGATIVE
STAPHYLOCOCCUS AUREUS: NEGATIVE

## 2016-11-05 LAB — APTT: APTT: 30 s (ref 24–36)

## 2016-11-05 LAB — URINALYSIS, ROUTINE W REFLEX MICROSCOPIC
BILIRUBIN URINE: NEGATIVE
Glucose, UA: NEGATIVE mg/dL
HGB URINE DIPSTICK: NEGATIVE
KETONES UR: NEGATIVE mg/dL
Leukocytes, UA: NEGATIVE
Nitrite: NEGATIVE
PROTEIN: NEGATIVE mg/dL
Specific Gravity, Urine: 1.01 (ref 1.005–1.030)
pH: 7 (ref 5.0–8.0)

## 2016-11-05 LAB — PROTIME-INR
INR: 1.04
PROTHROMBIN TIME: 13.6 s (ref 11.4–15.2)

## 2016-11-05 MED ORDER — CHLORHEXIDINE GLUCONATE 4 % EX LIQD: 60.0000 mL | Freq: Once | CUTANEOUS | Status: DC

## 2016-11-05 MED ORDER — POVIDONE-IODINE 7.5 % EX SOLN: Freq: Once | CUTANEOUS | Status: DC

## 2016-11-05 NOTE — Pre-Procedure Instructions (Signed)
Ralph [name]  11/05/2016      [facility] - [place], [place] - [address] [place] [address] Phone: [phone] Fax: [phone]  [id number] - [address] [place] - [address] AT [facility] [address] [place] [address] Phone: [phone] Fax: [phone]    Your procedure is scheduled on Wed, April 11 @ 8:30 AM  Report to [facility] Admitting at 6:30 AM  Call this number if you have problems the morning of surgery:  [phone]   Remember:  Do not eat food or drink liquids after midnight.  Take these medicines the morning of surgery with A SIP OF WATER Nasal Spray,Gabapentin(Neurontin),Metoprolol(Toprol),Omeprazole(Prilosec),and Pain Pill(if needed)             No Goody's,BC's,Aleve,Advil,Motrin,Ibuprofen,Fish Oil,or any Herbal Medications.    Do not wear jewelry.  Do not wear lotions, powders,colognes, or deoderant.  Men may shave face and neck.  Do not bring valuables to the hospital.  [facility] is not responsible for any belongings or valuables.  Contacts, dentures or bridgework may not be worn into surgery.  Leave your suitcase in the car.  After surgery it may be brought to your room.  For patients admitted to the hospital, discharge time will be determined by your treatment team.  Patients discharged the day of surgery will not be allowed to drive home.    [facility]  Before surgery, you can play an important role.  Because skin is not sterile, your skin needs to be as free of germs as possible.  You can reduce the number of germs on you skin by washing with CHG (chlorahexidine gluconate) soap before surgery.  CHG is an antiseptic cleaner which kills germs and bonds with the skin to continue killing germs even after washing.  Please DO NOT use if you have an allergy to CHG or antibacterial soaps.  If your skin becomes  reddened/irritated stop using the CHG and inform your nurse when you arrive at Short Stay.  Do not shave (including legs and underarms) for at least 48 hours prior to the first CHG shower.  You may shave your face.  Please follow these instructions carefully:   1.  Shower with CHG Soap the night before surgery and the                                morning of Surgery.  2.  If you choose to wash your hair, wash your hair first as usual with your       normal shampoo.  3.  After you shampoo, rinse your hair and body thoroughly to remove the                      Shampoo.  4.  Use CHG as you would any other liquid soap.  You can apply chg directly       to the skin and wash gently with scrungie or a clean washcloth.  5.  Apply the CHG Soap to your body ONLY FROM THE NECK DOWN.        Do not use on open wounds or open sores.  Avoid contact with your eyes,       ears, mouth and genitals (private parts).  Wash genitals (private parts)       with your  normal soap.  6.  Wash thoroughly, paying special attention to the area where your surgery        will be performed.  7.  Thoroughly rinse your body with warm water from the neck down.  8.  DO NOT shower/wash with your normal soap after using and rinsing off       the CHG Soap.  9.  Pat yourself dry with a clean towel.            10.  Wear clean pajamas.            11.  Place clean sheets on your bed the night of your first shower and do not        sleep with pets.  Day of Surgery  Do not apply any lotions/deoderants the morning of surgery.  Please wear clean clothes to the hospital/surgery center.    Please read over the following fact sheets that you were given. Pain Booklet, Coughing and Deep Breathing and MRSA Information

## 2016-11-05 NOTE — Progress Notes (Addendum)
Cardiologist denies  Medical Md is Dr.Lynley Leonor Liv  Echo report in epic from 2014  Stress test denies  Sleep study report in epic from 2017  EKG to be requested from medical md  CXR report in epic from 02-20-16 and 07-24-16  Heart cath denies

## 2016-11-11 MED ORDER — CEFAZOLIN SODIUM-DEXTROSE 2-4 GM/100ML-% IV SOLN
2.0000 g | INTRAVENOUS | Status: AC
  Administered 2016-11-12: 2 g via INTRAVENOUS
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
## 2016-11-12 ENCOUNTER — Inpatient Hospital Stay (HOSPITAL_COMMUNITY)
Admission: RE | Admit: 2016-11-12 | Discharge: 2016-11-14 | DRG: 455 | Disposition: A | Discharge status: Home Health | Payer: Medicare PPO | Source: Ambulatory Visit | Attending: Orthopedic Surgery | Admitting: Orthopedic Surgery

## 2016-11-12 ENCOUNTER — Inpatient Hospital Stay (HOSPITAL_COMMUNITY): Payer: Medicare PPO | Admitting: Anesthesiology

## 2016-11-12 ENCOUNTER — Encounter (HOSPITAL_COMMUNITY): Payer: Self-pay | Admitting: *Deleted

## 2016-11-12 ENCOUNTER — Inpatient Hospital Stay (HOSPITAL_COMMUNITY)
Admission: RE | Disposition: A | Discharge status: Home Health | Payer: Self-pay | Source: Ambulatory Visit | Attending: Orthopedic Surgery

## 2016-11-12 ENCOUNTER — Inpatient Hospital Stay (HOSPITAL_COMMUNITY): Payer: Medicare PPO

## 2016-11-12 DIAGNOSIS — Z8673 Personal history of transient ischemic attack (TIA), and cerebral infarction without residual deficits: Secondary | ICD-10-CM | POA: Diagnosis not present

## 2016-11-12 DIAGNOSIS — I10 Essential (primary) hypertension: Secondary | ICD-10-CM | POA: Diagnosis present

## 2016-11-12 DIAGNOSIS — M6281 Muscle weakness (generalized): Secondary | ICD-10-CM | POA: Diagnosis not present

## 2016-11-12 DIAGNOSIS — M48061 Spinal stenosis, lumbar region without neurogenic claudication: Secondary | ICD-10-CM | POA: Diagnosis present

## 2016-11-12 DIAGNOSIS — Z79899 Other long term (current) drug therapy: Secondary | ICD-10-CM

## 2016-11-12 DIAGNOSIS — K219 Gastro-esophageal reflux disease without esophagitis: Secondary | ICD-10-CM | POA: Diagnosis present

## 2016-11-12 DIAGNOSIS — M4316 Spondylolisthesis, lumbar region: Secondary | ICD-10-CM | POA: Diagnosis not present

## 2016-11-12 DIAGNOSIS — M48062 Spinal stenosis, lumbar region with neurogenic claudication: Secondary | ICD-10-CM | POA: Diagnosis not present

## 2016-11-12 DIAGNOSIS — F329 Major depressive disorder, single episode, unspecified: Secondary | ICD-10-CM | POA: Diagnosis present

## 2016-11-12 DIAGNOSIS — Z419 Encounter for procedure for purposes other than remedying health state, unspecified: Secondary | ICD-10-CM

## 2016-11-12 DIAGNOSIS — Z981 Arthrodesis status: Secondary | ICD-10-CM | POA: Diagnosis not present

## 2016-11-12 DIAGNOSIS — M5136 Other intervertebral disc degeneration, lumbar region: Secondary | ICD-10-CM | POA: Diagnosis not present

## 2016-11-12 DIAGNOSIS — Z87891 Personal history of nicotine dependence: Secondary | ICD-10-CM

## 2016-11-12 DIAGNOSIS — M5416 Radiculopathy, lumbar region: Principal | ICD-10-CM | POA: Diagnosis present

## 2016-11-12 DIAGNOSIS — Z86718 Personal history of other venous thrombosis and embolism: Secondary | ICD-10-CM

## 2016-11-12 DIAGNOSIS — M5410 Radiculopathy, site unspecified: Secondary | ICD-10-CM

## 2016-11-12 DIAGNOSIS — G2581 Restless legs syndrome: Secondary | ICD-10-CM | POA: Diagnosis not present

## 2016-11-12 DIAGNOSIS — M79604 Pain in right leg: Secondary | ICD-10-CM | POA: Diagnosis not present

## 2016-11-12 DIAGNOSIS — M79605 Pain in left leg: Secondary | ICD-10-CM | POA: Diagnosis not present

## 2016-11-12 DIAGNOSIS — M545 Low back pain: Secondary | ICD-10-CM | POA: Diagnosis present

## 2016-11-12 DIAGNOSIS — G4733 Obstructive sleep apnea (adult) (pediatric): Secondary | ICD-10-CM | POA: Diagnosis present

## 2016-11-12 HISTORY — DX: Radiculopathy, site unspecified: M54.10

## 2016-11-12 HISTORY — PX: ANTERIOR LAT LUMBAR FUSION: SHX1168

## 2016-11-12 SURGERY — ANTERIOR LATERAL LUMBAR FUSION 1 LEVEL
Anesthesia: General | Laterality: Left

## 2016-11-12 MED ORDER — MENTHOL 3 MG MT LOZG: 1.0000 | LOZENGE | OROMUCOSAL | Status: DC | PRN

## 2016-11-12 MED ORDER — FENTANYL CITRATE (PF) 250 MCG/5ML IJ SOLN
INTRAMUSCULAR | Status: AC
  Filled 2016-11-12: qty 5

## 2016-11-12 MED ORDER — ACETAMINOPHEN 325 MG PO TABS
650.0000 mg | ORAL_TABLET | ORAL | Status: DC | PRN
Start: 2016-11-12 — End: 2016-11-14

## 2016-11-12 MED ORDER — LACTATED RINGERS IV SOLN
INTRAVENOUS | Status: DC | PRN
  Administered 2016-11-12 (×3): via INTRAVENOUS

## 2016-11-12 MED ORDER — FENTANYL CITRATE (PF) 100 MCG/2ML IJ SOLN
INTRAMUSCULAR | Status: DC | PRN
  Administered 2016-11-12: 150 ug via INTRAVENOUS
  Administered 2016-11-12 (×7): 50 ug via INTRAVENOUS

## 2016-11-12 MED ORDER — BUPIVACAINE-EPINEPHRINE 0.25% -1:200000 IJ SOLN
INTRAMUSCULAR | Status: DC | PRN
  Administered 2016-11-12: 5 mL

## 2016-11-12 MED ORDER — ONDANSETRON HCL 4 MG/2ML IJ SOLN
INTRAMUSCULAR | Status: AC
  Filled 2016-11-12: qty 2

## 2016-11-12 MED ORDER — PROMETHAZINE HCL 25 MG/ML IJ SOLN: 6.2500 mg | INTRAMUSCULAR | Status: DC | PRN

## 2016-11-12 MED ORDER — LIDOCAINE 2% (20 MG/ML) 5 ML SYRINGE
INTRAMUSCULAR | Status: AC
  Filled 2016-11-12: qty 5

## 2016-11-12 MED ORDER — HYDROMORPHONE HCL 1 MG/ML IJ SOLN
INTRAMUSCULAR | Status: AC
  Filled 2016-11-12: qty 0.5

## 2016-11-12 MED ORDER — PHENOL 1.4 % MT LIQD: 1.0000 | OROMUCOSAL | Status: DC | PRN

## 2016-11-12 MED ORDER — POTASSIUM CHLORIDE IN NACL 20-0.9 MEQ/L-% IV SOLN
INTRAVENOUS | Status: DC
  Filled 2016-11-12: qty 1000

## 2016-11-12 MED ORDER — ACETAMINOPHEN 650 MG RE SUPP: 650.0000 mg | RECTAL | Status: DC | PRN

## 2016-11-12 MED ORDER — ONDANSETRON HCL 4 MG/2ML IJ SOLN: 4.0000 mg | Freq: Four times a day (QID) | INTRAMUSCULAR | Status: DC | PRN

## 2016-11-12 MED ORDER — SODIUM CHLORIDE 0.9 % IV SOLN: 250.0000 mL | INTRAVENOUS | Status: DC

## 2016-11-12 MED ORDER — GABAPENTIN 800 MG PO TABS
800.0000 mg | ORAL_TABLET | Freq: Three times a day (TID) | ORAL | Status: DC
  Filled 2016-11-12: qty 1

## 2016-11-12 MED ORDER — POTASSIUM CHLORIDE CRYS ER 20 MEQ PO TBCR
20.0000 meq | EXTENDED_RELEASE_TABLET | Freq: Two times a day (BID) | ORAL | Status: DC
  Administered 2016-11-12 – 2016-11-14 (×3): 20 meq via ORAL
  Filled 2016-11-12 (×3): qty 1

## 2016-11-12 MED ORDER — ONDANSETRON HCL 4 MG PO TABS: 4.0000 mg | ORAL_TABLET | Freq: Four times a day (QID) | ORAL | Status: DC | PRN

## 2016-11-12 MED ORDER — GLYCOPYRROLATE 0.2 MG/ML IJ SOLN
INTRAMUSCULAR | Status: DC | PRN
  Administered 2016-11-12: 0.4 mg via INTRAVENOUS

## 2016-11-12 MED ORDER — PROPOFOL 10 MG/ML IV BOLUS
INTRAVENOUS | Status: AC
  Filled 2016-11-12: qty 20

## 2016-11-12 MED ORDER — SODIUM CHLORIDE 0.9% FLUSH
3.0000 mL | Freq: Two times a day (BID) | INTRAVENOUS | Status: DC
  Administered 2016-11-12 – 2016-11-14 (×2): 3 mL via INTRAVENOUS

## 2016-11-12 MED ORDER — SUCCINYLCHOLINE CHLORIDE 200 MG/10ML IV SOSY
PREFILLED_SYRINGE | INTRAVENOUS | Status: AC
  Filled 2016-11-12: qty 10

## 2016-11-12 MED ORDER — CEFAZOLIN SODIUM-DEXTROSE 2-4 GM/100ML-% IV SOLN
2.0000 g | Freq: Three times a day (TID) | INTRAVENOUS | Status: AC
  Administered 2016-11-12 – 2016-11-13 (×2): 2 g via INTRAVENOUS
  Filled 2016-11-12 (×2): qty 100

## 2016-11-12 MED ORDER — SUCCINYLCHOLINE CHLORIDE 20 MG/ML IJ SOLN
INTRAMUSCULAR | Status: DC | PRN
  Administered 2016-11-12: 110 mg via INTRAVENOUS

## 2016-11-12 MED ORDER — CLONIDINE HCL 0.1 MG PO TABS
0.1000 mg | ORAL_TABLET | Freq: Every evening | ORAL | Status: DC
  Administered 2016-11-12 – 2016-11-13 (×2): 0.1 mg via ORAL
  Filled 2016-11-12 (×2): qty 1

## 2016-11-12 MED ORDER — ZOLPIDEM TARTRATE 5 MG PO TABS: 5.0000 mg | ORAL_TABLET | Freq: Every evening | ORAL | Status: DC | PRN

## 2016-11-12 MED ORDER — MORPHINE SULFATE (PF) 2 MG/ML IV SOLN
1.0000 mg | INTRAVENOUS | Status: DC | PRN
  Administered 2016-11-13: 2 mg via INTRAVENOUS
  Filled 2016-11-12: qty 1

## 2016-11-12 MED ORDER — MIDAZOLAM HCL 5 MG/5ML IJ SOLN
INTRAMUSCULAR | Status: DC | PRN
  Administered 2016-11-12: 2 mg via INTRAVENOUS

## 2016-11-12 MED ORDER — FLEET ENEMA 7-19 GM/118ML RE ENEM: 1.0000 | ENEMA | Freq: Once | RECTAL | Status: DC | PRN

## 2016-11-12 MED ORDER — SODIUM CHLORIDE 0.9% FLUSH: 3.0000 mL | INTRAVENOUS | Status: DC | PRN

## 2016-11-12 MED ORDER — PROPOFOL 500 MG/50ML IV EMUL
INTRAVENOUS | Status: DC | PRN
  Administered 2016-11-12: 75 ug/kg/min via INTRAVENOUS

## 2016-11-12 MED ORDER — VALSARTAN-HYDROCHLOROTHIAZIDE 320-25 MG PO TABS: 1.0000 | ORAL_TABLET | Freq: Every day | ORAL | Status: DC

## 2016-11-12 MED ORDER — PANTOPRAZOLE SODIUM 40 MG PO TBEC
80.0000 mg | DELAYED_RELEASE_TABLET | Freq: Every day | ORAL | Status: DC
  Administered 2016-11-14: 80 mg via ORAL
  Filled 2016-11-12: qty 2

## 2016-11-12 MED ORDER — CEFAZOLIN SODIUM-DEXTROSE 2-4 GM/100ML-% IV SOLN: 2.0000 g | INTRAVENOUS | Status: DC

## 2016-11-12 MED ORDER — PHENYLEPHRINE HCL 10 MG/ML IJ SOLN
INTRAVENOUS | Status: DC | PRN
  Administered 2016-11-12: 45 ug/min via INTRAVENOUS

## 2016-11-12 MED ORDER — MIDAZOLAM HCL 2 MG/2ML IJ SOLN
INTRAMUSCULAR | Status: AC
  Filled 2016-11-12: qty 2

## 2016-11-12 MED ORDER — 0.9 % SODIUM CHLORIDE (POUR BTL) OPTIME
TOPICAL | Status: DC | PRN
  Administered 2016-11-12: 1000 mL

## 2016-11-12 MED ORDER — ARTIFICIAL TEARS OP OINT
TOPICAL_OINTMENT | OPHTHALMIC | Status: DC | PRN
  Administered 2016-11-12: 1 via OPHTHALMIC

## 2016-11-12 MED ORDER — ALUM & MAG HYDROXIDE-SIMETH 200-200-20 MG/5ML PO SUSP: 30.0000 mL | Freq: Four times a day (QID) | ORAL | Status: DC | PRN

## 2016-11-12 MED ORDER — METOPROLOL SUCCINATE ER 100 MG PO TB24: 100.0000 mg | ORAL_TABLET | Freq: Every day | ORAL | Status: DC

## 2016-11-12 MED ORDER — PANTOPRAZOLE SODIUM 40 MG IV SOLR
40.0000 mg | Freq: Every day | INTRAVENOUS | Status: DC
  Administered 2016-11-12: 40 mg via INTRAVENOUS
  Filled 2016-11-12: qty 40

## 2016-11-12 MED ORDER — AZELASTINE HCL 0.1 % NA SOLN
2.0000 | Freq: Two times a day (BID) | NASAL | Status: DC | PRN
  Administered 2016-11-12: 2 via NASAL
  Filled 2016-11-12: qty 30

## 2016-11-12 MED ORDER — LIDOCAINE HCL (CARDIAC) 20 MG/ML IV SOLN
INTRAVENOUS | Status: DC | PRN
  Administered 2016-11-12: 100 mg via INTRAVENOUS

## 2016-11-12 MED ORDER — DOCUSATE SODIUM 100 MG PO CAPS
100.0000 mg | ORAL_CAPSULE | Freq: Two times a day (BID) | ORAL | Status: DC
Start: 2016-11-12 — End: 2016-11-14
  Administered 2016-11-12 – 2016-11-14 (×3): 100 mg via ORAL
  Filled 2016-11-12 (×3): qty 1

## 2016-11-12 MED ORDER — HYDROCHLOROTHIAZIDE 25 MG PO TABS: 25.0000 mg | ORAL_TABLET | Freq: Every day | ORAL | Status: DC

## 2016-11-12 MED ORDER — BISACODYL 5 MG PO TBEC: 5.0000 mg | DELAYED_RELEASE_TABLET | Freq: Every day | ORAL | Status: DC | PRN

## 2016-11-12 MED ORDER — PHENYLEPHRINE 40 MCG/ML (10ML) SYRINGE FOR IV PUSH (FOR BLOOD PRESSURE SUPPORT)
PREFILLED_SYRINGE | INTRAVENOUS | Status: AC
Start: 2016-11-12 — End: 2016-11-12
  Filled 2016-11-12: qty 10

## 2016-11-12 MED ORDER — NORTRIPTYLINE HCL 25 MG PO CAPS
150.0000 mg | ORAL_CAPSULE | Freq: Every day | ORAL | Status: DC
  Administered 2016-11-12 – 2016-11-13 (×2): 150 mg via ORAL
  Filled 2016-11-12 (×2): qty 12

## 2016-11-12 MED ORDER — EPHEDRINE 5 MG/ML INJ
INTRAVENOUS | Status: AC
  Filled 2016-11-12: qty 10

## 2016-11-12 MED ORDER — AMLODIPINE BESYLATE 10 MG PO TABS
10.0000 mg | ORAL_TABLET | Freq: Every day | ORAL | Status: DC
Start: 2016-11-12 — End: 2016-11-14
  Administered 2016-11-12 – 2016-11-13 (×2): 10 mg via ORAL
  Filled 2016-11-12 (×2): qty 1

## 2016-11-12 MED ORDER — GABAPENTIN 400 MG PO CAPS
800.0000 mg | ORAL_CAPSULE | Freq: Three times a day (TID) | ORAL | Status: DC
  Administered 2016-11-12 – 2016-11-14 (×4): 800 mg via ORAL
  Filled 2016-11-12 (×3): qty 2

## 2016-11-12 MED ORDER — IRBESARTAN 300 MG PO TABS: 300.0000 mg | ORAL_TABLET | Freq: Every day | ORAL | Status: DC

## 2016-11-12 MED ORDER — SENNOSIDES-DOCUSATE SODIUM 8.6-50 MG PO TABS: 1.0000 | ORAL_TABLET | Freq: Every evening | ORAL | Status: DC | PRN

## 2016-11-12 MED ORDER — THROMBIN 20000 UNITS EX SOLR
CUTANEOUS | Status: AC
  Filled 2016-11-12: qty 20000

## 2016-11-12 MED ORDER — EPINEPHRINE PF 1 MG/ML IJ SOLN
INTRAMUSCULAR | Status: AC
  Filled 2016-11-12: qty 1

## 2016-11-12 MED ORDER — OXYCODONE-ACETAMINOPHEN 5-325 MG PO TABS
1.0000 | ORAL_TABLET | ORAL | Status: DC | PRN
  Administered 2016-11-12: 2 via ORAL
  Administered 2016-11-12: 1 via ORAL
  Administered 2016-11-12 – 2016-11-14 (×5): 2 via ORAL
  Filled 2016-11-12 (×6): qty 2

## 2016-11-12 MED ORDER — CEFAZOLIN SODIUM-DEXTROSE 2-4 GM/100ML-% IV SOLN
INTRAVENOUS | Status: AC
  Filled 2016-11-12: qty 100

## 2016-11-12 MED ORDER — ONDANSETRON HCL 4 MG/2ML IJ SOLN
INTRAMUSCULAR | Status: DC | PRN
  Administered 2016-11-12 (×2): 4 mg via INTRAVENOUS

## 2016-11-12 MED ORDER — EPHEDRINE SULFATE 50 MG/ML IJ SOLN
INTRAMUSCULAR | Status: DC | PRN
  Administered 2016-11-12 (×2): 5 mg via INTRAVENOUS

## 2016-11-12 MED ORDER — DIAZEPAM 5 MG PO TABS: 5.0000 mg | ORAL_TABLET | Freq: Four times a day (QID) | ORAL | Status: DC | PRN

## 2016-11-12 MED ORDER — DEXTROSE 5 % IV SOLN
INTRAVENOUS | Status: DC | PRN
  Administered 2016-11-12: 09:00:00 via INTRAVENOUS

## 2016-11-12 MED ORDER — PROPOFOL 10 MG/ML IV BOLUS
INTRAVENOUS | Status: DC | PRN
Start: 2016-11-12 — End: 2016-11-12
  Administered 2016-11-12: 50 mg via INTRAVENOUS
  Administered 2016-11-12: 200 mg via INTRAVENOUS
  Administered 2016-11-12: 50 mg via INTRAVENOUS

## 2016-11-12 MED ORDER — BUPIVACAINE LIPOSOME 1.3 % IJ SUSP
20.0000 mL | INTRAMUSCULAR | Status: DC
  Filled 2016-11-12: qty 20

## 2016-11-12 MED ORDER — OXYCODONE-ACETAMINOPHEN 5-325 MG PO TABS
ORAL_TABLET | ORAL | Status: AC
  Filled 2016-11-12: qty 1

## 2016-11-12 MED ORDER — HYDROMORPHONE HCL 1 MG/ML IJ SOLN
0.2500 mg | INTRAMUSCULAR | Status: DC | PRN
  Administered 2016-11-12: 0.25 mg via INTRAVENOUS

## 2016-11-12 MED ORDER — ARTIFICIAL TEARS OP OINT
TOPICAL_OINTMENT | OPHTHALMIC | Status: AC
  Filled 2016-11-12: qty 3.5

## 2016-11-12 SURGICAL SUPPLY — 72 items
APL SKNCLS STERI-STRIP NONHPOA (GAUZE/BANDAGES/DRESSINGS) ×1
BENZOIN TINCTURE PRP APPL 2/3 (GAUZE/BANDAGES/DRESSINGS) ×2 IMPLANT
BLADE CLIPPER SURG (BLADE) ×2 IMPLANT
BLADE SURG 10 STRL SS (BLADE) ×3 IMPLANT
BONE VIVIGEN FORMABLE 5.4CC (Bone Implant) ×3 IMPLANT
CLIP NEUROVISION LG (CLIP) ×2 IMPLANT
CLOSURE WOUND 1/2 X4 (GAUZE/BANDAGES/DRESSINGS) ×1
COROENT XL-W 12X22X60MM (Orthopedic Implant) ×2 IMPLANT
COVER BACK TABLE 80X110 HD (DRAPES) ×3 IMPLANT
COVER SURGICAL LIGHT HANDLE (MISCELLANEOUS) ×3 IMPLANT
DRAPE C-ARM 42X72 X-RAY (DRAPES) ×3 IMPLANT
DRAPE POUCH INSTRU U-SHP 10X18 (DRAPES) ×3 IMPLANT
DRAPE SURG 17X11 SM STRL (DRAPES) ×16 IMPLANT
DRAPE SURG 17X23 STRL (DRAPES) ×12 IMPLANT
DURAPREP 26ML APPLICATOR (WOUND CARE) ×3 IMPLANT
ELECT BLADE 6.5 EXT (BLADE) ×3 IMPLANT
ELECT CAUTERY BLADE 6.4 (BLADE) ×3 IMPLANT
ELECT REM PT RETURN 9FT ADLT (ELECTROSURGICAL) ×3
ELECTRODE REM PT RTRN 9FT ADLT (ELECTROSURGICAL) ×1 IMPLANT
GAUZE SPONGE 4X4 12PLY STRL (GAUZE/BANDAGES/DRESSINGS) ×2 IMPLANT
GAUZE SPONGE 4X4 16PLY XRAY LF (GAUZE/BANDAGES/DRESSINGS) ×3 IMPLANT
GLOVE BIO SURGEON STRL SZ7 (GLOVE) ×10 IMPLANT
GLOVE BIO SURGEON STRL SZ8 (GLOVE) ×3 IMPLANT
GLOVE BIOGEL PI IND STRL 6.5 (GLOVE) IMPLANT
GLOVE BIOGEL PI IND STRL 7.0 (GLOVE) ×1 IMPLANT
GLOVE BIOGEL PI IND STRL 8 (GLOVE) ×1 IMPLANT
GLOVE BIOGEL PI INDICATOR 6.5 (GLOVE) ×2
GLOVE BIOGEL PI INDICATOR 7.0 (GLOVE) ×4
GLOVE BIOGEL PI INDICATOR 8 (GLOVE) ×2
GLOVE SURG SS PI 6.5 STRL IVOR (GLOVE) ×4 IMPLANT
GOWN STRL REUS W/ TWL LRG LVL3 (GOWN DISPOSABLE) ×1 IMPLANT
GOWN STRL REUS W/ TWL XL LVL3 (GOWN DISPOSABLE) ×2 IMPLANT
GOWN STRL REUS W/TWL LRG LVL3 (GOWN DISPOSABLE) ×9
GOWN STRL REUS W/TWL XL LVL3 (GOWN DISPOSABLE) ×6
GRAFT BNE MATRIX VG FRMBL MD 5 (Bone Implant) IMPLANT
KIT BASIN OR (CUSTOM PROCEDURE TRAY) ×3 IMPLANT
KIT DILATOR XLIF 5 (KITS) IMPLANT
KIT ROOM TURNOVER OR (KITS) ×3 IMPLANT
KIT SURGICAL ACCESS MAXCESS 4 (KITS) ×2 IMPLANT
KIT XLIF (KITS) ×2
MARKER SKIN DUAL TIP RULER LAB (MISCELLANEOUS) ×3 IMPLANT
MODULE EMG NDL SSEP NVM5 (NEEDLE) IMPLANT
MODULE EMG NEEDLE SSEP NVM5 (NEEDLE) ×3 IMPLANT
MODULE NVM5 NEXT GEN EMG (NEEDLE) ×2 IMPLANT
NDL HYPO 25GX1X1/2 BEV (NEEDLE) ×1 IMPLANT
NDL SPNL 18GX3.5 QUINCKE PK (NEEDLE) ×1 IMPLANT
NEEDLE HYPO 25GX1X1/2 BEV (NEEDLE) ×3 IMPLANT
NEEDLE SPNL 18GX3.5 QUINCKE PK (NEEDLE) ×3 IMPLANT
NS IRRIG 1000ML POUR BTL (IV SOLUTION) ×8 IMPLANT
PACK LAMINECTOMY ORTHO (CUSTOM PROCEDURE TRAY) ×3 IMPLANT
PACK UNIVERSAL I (CUSTOM PROCEDURE TRAY) ×3 IMPLANT
PAD ARMBOARD 7.5X6 YLW CONV (MISCELLANEOUS) ×6 IMPLANT
PLATE DECADE XLIF 2H SZ14 (Plate) ×2 IMPLANT
PROBE BALL TIP NVM5 SNG USE (BALLOONS) ×2 IMPLANT
SCREW DECADE 5.5X50 (Screw) ×4 IMPLANT
SPONGE INTESTINAL PEANUT (DISPOSABLE) ×6 IMPLANT
SPONGE LAP 4X18 X RAY DECT (DISPOSABLE) ×3 IMPLANT
SPONGE SURGIFOAM ABS GEL 100 (HEMOSTASIS) IMPLANT
STAPLER VISISTAT 35W (STAPLE) ×3 IMPLANT
STRIP CLOSURE SKIN 1/2X4 (GAUZE/BANDAGES/DRESSINGS) ×1 IMPLANT
SURGIFLO W/THROMBIN 8M KIT (HEMOSTASIS) IMPLANT
SUT MNCRL AB 4-0 PS2 18 (SUTURE) ×3 IMPLANT
SUT VIC AB 0 CT1 18XCR BRD 8 (SUTURE) ×1 IMPLANT
SUT VIC AB 0 CT1 8-18 (SUTURE) ×3
SUT VIC AB 2-0 CT2 18 VCP726D (SUTURE) ×3 IMPLANT
SYR BULB IRRIGATION 50ML (SYRINGE) ×3 IMPLANT
SYR CONTROL 10ML LL (SYRINGE) ×3 IMPLANT
TOWEL OR 17X24 6PK STRL BLUE (TOWEL DISPOSABLE) ×3 IMPLANT
TOWEL OR 17X26 10 PK STRL BLUE (TOWEL DISPOSABLE) ×3 IMPLANT
TRAY FOLEY CATH 16FR SILVER (SET/KITS/TRAYS/PACK) ×3 IMPLANT
WATER STERILE IRR 1000ML POUR (IV SOLUTION) ×3 IMPLANT
YANKAUER SUCT BULB TIP NO VENT (SUCTIONS) ×3 IMPLANT

## 2016-11-12 NOTE — H&P (Signed)
Patient underwent a lateral fusion yesterday at L3/4, and presents today for a posterior lumbar decompression and fusion at L3/4 (stage 2). Will proceed as planned.

## 2016-11-12 NOTE — Anesthesia Procedure Notes (Signed)
Procedure Name: Intubation Date/Time: 11/12/2016 8:41 AM Performed by: Jacquiline Doe A Pre-anesthesia Checklist: Patient identified, Emergency Drugs available, Suction available and Patient being monitored Patient Re-evaluated:Patient Re-evaluated prior to inductionOxygen Delivery Method: Circle System Utilized and Circle system utilized Preoxygenation: Pre-oxygenation with 100% oxygen Intubation Type: IV induction and Cricoid Pressure applied Ventilation: Mask ventilation without difficulty Laryngoscope Size: Mac and 4 Grade View: Grade I Tube type: Oral Tube size: 7.5 mm Number of attempts: 1 Airway Equipment and Method: Stylet Placement Confirmation: ETT inserted through vocal cords under direct vision,  positive ETCO2 and breath sounds checked- equal and bilateral Secured at: 23 cm Tube secured with: Tape Dental Injury: Teeth and Oropharynx as per pre-operative assessment

## 2016-11-12 NOTE — Anesthesia Postprocedure Evaluation (Addendum)
Anesthesia Post Note  Patient: LAVOY BERNARDS  Procedure(s) Performed: Procedure(s) (LRB): LEFT SIDED LUMBAR 3-4 LATERAL INTERBODY FUSION WITH INSTRUMENTATION AND ALLOGRAFT (Left)  Patient location during evaluation: PACU Anesthesia Type: General Level of consciousness: awake and sedated Pain management: pain level controlled Vital Signs Assessment: post-procedure vital signs reviewed and stable Respiratory status: spontaneous breathing, nonlabored ventilation, respiratory function stable and patient connected to nasal cannula oxygen Cardiovascular status: blood pressure returned to baseline and stable Postop Assessment: no signs of nausea or vomiting Anesthetic complications: no       Last Vitals:  Vitals:   11/12/16 1400 11/12/16 1430  BP: 113/78 115/77  Pulse: (!) 52 (!) 59  Resp: 12 15  Temp:      Last Pain:  Vitals:   11/12/16 1300  TempSrc:   PainSc: Asleep                 Darl Kuss,JAMES TERRILL

## 2016-11-12 NOTE — Anesthesia Preprocedure Evaluation (Addendum)
Anesthesia Evaluation  Patient identified by MRN, date of birth, ID band Patient awake    History of Anesthesia Complications (+) PONV  Airway Mallampati: I  TM Distance: >3 FB Neck ROM: Full    Dental  (+) Teeth Intact   Pulmonary    breath sounds clear to auscultation       Cardiovascular hypertension,  Rhythm:Regular Rate:Normal     Neuro/Psych Hx of spinal cord injury and CVA CVA, Residual Symptoms    GI/Hepatic GERD  ,  Endo/Other    Renal/GU      Musculoskeletal  (+) Arthritis ,   Abdominal (+) + obese,   Peds  Hematology   Anesthesia Other Findings   Reproductive/Obstetrics                            Anesthesia Physical Anesthesia Plan  ASA: [name]  Anesthesia Plan: General   Post-op Pain Management:    Induction: Intravenous  Airway Management Planned: Oral ETT  Additional Equipment:   Intra-op Plan:   Post-operative Plan: Extubation in OR  Informed Consent: I have reviewed the patients History and Physical, chart, labs and discussed the procedure including the risks, benefits and alternatives for the proposed anesthesia with the patient or authorized representative who has indicated his/her understanding and acceptance.     Plan Discussed with: CRNA  Anesthesia Plan Comments:         Anesthesia Quick Evaluation

## 2016-11-12 NOTE — Anesthesia Preprocedure Evaluation (Addendum)
Anesthesia Evaluation  Patient identified by MRN, date of birth, ID band Patient awake    Reviewed: Allergy & Precautions, H&P , Patient's Chart, lab work & pertinent test results, reviewed documented beta blocker date and time   History of Anesthesia Complications (+) PONV  Airway Mallampati: I  TM Distance: >3 FB Neck ROM: Full    Dental no notable dental hx. (+) Teeth Intact   Pulmonary    Pulmonary exam normal breath sounds clear to auscultation       Cardiovascular hypertension,  Rhythm:Regular Rate:Normal     Neuro/Psych Hx of spinal cord injury and CVA CVA, Residual Symptoms    GI/Hepatic GERD  ,  Endo/Other    Renal/GU      Musculoskeletal  (+) Arthritis ,   Abdominal (+) + obese,   Peds  Hematology   Anesthesia Other Findings   Reproductive/Obstetrics                            Anesthesia Physical  Anesthesia Plan  ASA: [name]  Anesthesia Plan: General   Post-op Pain Management:    Induction: Intravenous  Airway Management Planned: Oral ETT  Additional Equipment:   Intra-op Plan:   Post-operative Plan: Extubation in OR  Informed Consent: I have reviewed the patients History and Physical, chart, labs and discussed the procedure including the risks, benefits and alternatives for the proposed anesthesia with the patient or authorized representative who has indicated his/her understanding and acceptance.   Dental Advisory Given  Plan Discussed with: CRNA  Anesthesia Plan Comments:         Anesthesia Quick Evaluation

## 2016-11-12 NOTE — Transfer of Care (Signed)
Immediate Anesthesia Transfer of Care Note  Patient: Ralph [name]  Procedure(s) Performed: Procedure(s) with comments: LEFT SIDED LUMBAR 3-4 LATERAL INTERBODY FUSION WITH INSTRUMENTATION AND ALLOGRAFT (Left) - LEFT SIDED LUMBAR 3-4 LATERAL INTERBODY FUSION WITH INSTRUMENTATION AND ALLOGRAFT  Patient Location: PACU  Anesthesia Type:General  Level of Consciousness: awake, oriented, sedated, patient cooperative and responds to stimulation  Airway & Oxygen Therapy: Patient Spontanous Breathing and Patient connected to nasal cannula oxygen  Post-op Assessment: Report given to RN, Post -op Vital signs reviewed and stable, Patient moving all extremities and Patient moving all extremities X 4  Post vital signs: Reviewed and stable  Last Vitals:  Vitals:   11/12/16 0628  BP: 128/81  Pulse: 71  Resp: 18  Temp: 36.7 C    Last Pain:  Vitals:   11/12/16 0631  TempSrc:   PainSc: 10-Worst pain ever      Patients Stated Pain Goal: 6 (11/12/16 0631)  Complications: No apparent anesthesia complications

## 2016-11-13 ENCOUNTER — Encounter (HOSPITAL_COMMUNITY): Payer: Self-pay | Admitting: Anesthesiology

## 2016-11-13 ENCOUNTER — Inpatient Hospital Stay (HOSPITAL_COMMUNITY): Payer: Medicare PPO | Admitting: Anesthesiology

## 2016-11-13 ENCOUNTER — Inpatient Hospital Stay (HOSPITAL_COMMUNITY)
Admission: RE | Disposition: A | Discharge status: Home Health | Payer: Self-pay | Source: Ambulatory Visit | Attending: Orthopedic Surgery

## 2016-11-13 ENCOUNTER — Ambulatory Visit: Payer: Medicare PPO | Admitting: Adult Health

## 2016-11-13 ENCOUNTER — Inpatient Hospital Stay (HOSPITAL_COMMUNITY): Payer: Medicare PPO

## 2016-11-13 ENCOUNTER — Inpatient Hospital Stay (HOSPITAL_COMMUNITY): Admission: RE | Admit: 2016-11-13 | Payer: Medicare PPO | Source: Ambulatory Visit | Admitting: Orthopedic Surgery

## 2016-11-13 SURGERY — POSTERIOR LUMBAR FUSION 1 LEVEL
Anesthesia: General | Site: Spine Lumbar | Laterality: Bilateral

## 2016-11-13 MED ORDER — BUPIVACAINE HCL (PF) 0.25 % IJ SOLN
INTRAMUSCULAR | Status: AC
  Filled 2016-11-13: qty 30

## 2016-11-13 MED ORDER — FENTANYL CITRATE (PF) 250 MCG/5ML IJ SOLN
INTRAMUSCULAR | Status: AC
  Filled 2016-11-13: qty 5

## 2016-11-13 MED ORDER — MIDAZOLAM HCL 5 MG/5ML IJ SOLN
INTRAMUSCULAR | Status: DC | PRN
  Administered 2016-11-13: 2 mg via INTRAVENOUS

## 2016-11-13 MED ORDER — MIDAZOLAM HCL 2 MG/2ML IJ SOLN
INTRAMUSCULAR | Status: AC
  Filled 2016-11-13: qty 2

## 2016-11-13 MED ORDER — HYDROMORPHONE HCL 1 MG/ML IJ SOLN
INTRAMUSCULAR | Status: AC
  Filled 2016-11-13: qty 0.5

## 2016-11-13 MED ORDER — VECURONIUM BROMIDE 10 MG IV SOLR
INTRAVENOUS | Status: AC
  Filled 2016-11-13: qty 10

## 2016-11-13 MED ORDER — CHLORHEXIDINE GLUCONATE 0.12 % MT SOLN
15.0000 mL | Freq: Two times a day (BID) | OROMUCOSAL | Status: DC
  Administered 2016-11-13 – 2016-11-14 (×2): 15 mL via OROMUCOSAL
  Filled 2016-11-13 (×2): qty 15

## 2016-11-13 MED ORDER — METHYLENE BLUE 0.5 % INJ SOLN
INTRAVENOUS | Status: DC | PRN
  Administered 2016-11-13: .2 mL via INTRADERMAL

## 2016-11-13 MED ORDER — BUPIVACAINE LIPOSOME 1.3 % IJ SUSP
20.0000 mL | Freq: Once | INTRAMUSCULAR | Status: DC
  Filled 2016-11-13: qty 20

## 2016-11-13 MED ORDER — CEFAZOLIN SODIUM-DEXTROSE 2-3 GM-% IV SOLR
INTRAVENOUS | Status: DC | PRN
  Administered 2016-11-13: 2 g via INTRAVENOUS

## 2016-11-13 MED ORDER — PROPOFOL 10 MG/ML IV BOLUS
INTRAVENOUS | Status: DC | PRN
  Administered 2016-11-13: 200 mg via INTRAVENOUS

## 2016-11-13 MED ORDER — BUPIVACAINE LIPOSOME 1.3 % IJ SUSP
INTRAMUSCULAR | Status: DC | PRN
  Administered 2016-11-13: 20 mL

## 2016-11-13 MED ORDER — LACTATED RINGERS IV SOLN
INTRAVENOUS | Status: DC | PRN
  Administered 2016-11-13 (×2): via INTRAVENOUS

## 2016-11-13 MED ORDER — PROPOFOL 500 MG/50ML IV EMUL
INTRAVENOUS | Status: DC | PRN
  Administered 2016-11-13: 100 ug/kg/min via INTRAVENOUS

## 2016-11-13 MED ORDER — PHENYLEPHRINE 40 MCG/ML (10ML) SYRINGE FOR IV PUSH (FOR BLOOD PRESSURE SUPPORT)
PREFILLED_SYRINGE | INTRAVENOUS | Status: AC
  Filled 2016-11-13: qty 10

## 2016-11-13 MED ORDER — PHENYLEPHRINE HCL 10 MG/ML IJ SOLN
INTRAVENOUS | Status: DC | PRN
  Administered 2016-11-13: 45 ug/min via INTRAVENOUS

## 2016-11-13 MED ORDER — EPHEDRINE 5 MG/ML INJ
INTRAVENOUS | Status: AC
  Filled 2016-11-13: qty 10

## 2016-11-13 MED ORDER — PROPOFOL 10 MG/ML IV BOLUS
INTRAVENOUS | Status: AC
  Filled 2016-11-13: qty 40

## 2016-11-13 MED ORDER — ROCURONIUM BROMIDE 100 MG/10ML IV SOLN
INTRAVENOUS | Status: DC | PRN
  Administered 2016-11-13: 50 mg via INTRAVENOUS

## 2016-11-13 MED ORDER — METHYLENE BLUE 0.5 % INJ SOLN
INTRAVENOUS | Status: AC
  Filled 2016-11-13: qty 10

## 2016-11-13 MED ORDER — 0.9 % SODIUM CHLORIDE (POUR BTL) OPTIME
TOPICAL | Status: DC | PRN
  Administered 2016-11-13: 2000 mL

## 2016-11-13 MED ORDER — EPINEPHRINE PF 1 MG/ML IJ SOLN
INTRAMUSCULAR | Status: AC
  Filled 2016-11-13: qty 1

## 2016-11-13 MED ORDER — ARTIFICIAL TEARS OP OINT
TOPICAL_OINTMENT | OPHTHALMIC | Status: DC | PRN
  Administered 2016-11-13: 1 via OPHTHALMIC

## 2016-11-13 MED ORDER — ONDANSETRON HCL 4 MG/2ML IJ SOLN
INTRAMUSCULAR | Status: DC | PRN
  Administered 2016-11-13 (×2): 4 mg via INTRAVENOUS

## 2016-11-13 MED ORDER — SUCCINYLCHOLINE CHLORIDE 200 MG/10ML IV SOSY
PREFILLED_SYRINGE | INTRAVENOUS | Status: AC
  Filled 2016-11-13: qty 10

## 2016-11-13 MED ORDER — HYDROMORPHONE HCL 1 MG/ML IJ SOLN
INTRAMUSCULAR | Status: AC
  Filled 2016-11-13: qty 1

## 2016-11-13 MED ORDER — ALBUMIN HUMAN 5 % IV SOLN
INTRAVENOUS | Status: DC | PRN
  Administered 2016-11-13: 10:00:00 via INTRAVENOUS

## 2016-11-13 MED ORDER — CEFAZOLIN SODIUM 1 G IJ SOLR
INTRAMUSCULAR | Status: AC
  Filled 2016-11-13: qty 20

## 2016-11-13 MED ORDER — THROMBIN 20000 UNITS EX SOLR
CUTANEOUS | Status: AC
  Filled 2016-11-13: qty 20000

## 2016-11-13 MED ORDER — LIDOCAINE 2% (20 MG/ML) 5 ML SYRINGE
INTRAMUSCULAR | Status: AC
  Filled 2016-11-13: qty 5

## 2016-11-13 MED ORDER — VECURONIUM BROMIDE 10 MG IV SOLR
INTRAVENOUS | Status: DC | PRN
  Administered 2016-11-13: 5 mg via INTRAVENOUS
  Administered 2016-11-13: 3 mg via INTRAVENOUS

## 2016-11-13 MED ORDER — BUPIVACAINE HCL (PF) 0.25 % IJ SOLN
INTRAMUSCULAR | Status: DC | PRN
  Administered 2016-11-13: 8 mL
  Administered 2016-11-13: 20 mL

## 2016-11-13 MED ORDER — SUCCINYLCHOLINE CHLORIDE 20 MG/ML IJ SOLN
INTRAMUSCULAR | Status: DC | PRN
  Administered 2016-11-13: 160 mg via INTRAVENOUS

## 2016-11-13 MED ORDER — HYDROMORPHONE HCL 1 MG/ML IJ SOLN
0.2500 mg | INTRAMUSCULAR | Status: DC | PRN
  Administered 2016-11-13: 0.25 mg via INTRAVENOUS
  Administered 2016-11-13: 0.5 mg via INTRAVENOUS
  Administered 2016-11-13: 0.25 mg via INTRAVENOUS

## 2016-11-13 MED ORDER — HYDROMORPHONE HCL 1 MG/ML IJ SOLN
INTRAMUSCULAR | Status: DC | PRN
  Administered 2016-11-13 (×2): 0.5 mg via INTRAVENOUS

## 2016-11-13 MED ORDER — LIDOCAINE HCL (CARDIAC) 20 MG/ML IV SOLN
INTRAVENOUS | Status: DC | PRN
  Administered 2016-11-13: 60 mg via INTRAVENOUS
  Administered 2016-11-13: 40 mg via INTRAVENOUS

## 2016-11-13 MED ORDER — SUGAMMADEX SODIUM 200 MG/2ML IV SOLN
INTRAVENOUS | Status: DC | PRN
Start: 2016-11-13 — End: 2016-11-13
  Administered 2016-11-13: 200 mg via INTRAVENOUS

## 2016-11-13 MED ORDER — BUPIVACAINE HCL (PF) 0.25 % IJ SOLN
INTRAMUSCULAR | Status: AC
  Filled 2016-11-13: qty 60

## 2016-11-13 MED ORDER — FENTANYL CITRATE (PF) 100 MCG/2ML IJ SOLN
INTRAMUSCULAR | Status: DC | PRN
  Administered 2016-11-13: 50 ug via INTRAVENOUS
  Administered 2016-11-13: 100 ug via INTRAVENOUS
  Administered 2016-11-13 (×2): 50 ug via INTRAVENOUS

## 2016-11-13 MED ORDER — THROMBIN 20000 UNITS EX KIT
PACK | CUTANEOUS | Status: DC | PRN
  Administered 2016-11-13: 20000 [IU] via TOPICAL

## 2016-11-13 MED FILL — Sodium Chloride IV Soln 0.9%: INTRAVENOUS | Qty: 1000 | Status: AC

## 2016-11-13 MED FILL — Heparin Sodium (Porcine) Inj 1000 Unit/ML: INTRAMUSCULAR | Qty: 30 | Status: AC

## 2016-11-13 SURGICAL SUPPLY — 95 items
APL SKNCLS STERI-STRIP NONHPOA (GAUZE/BANDAGES/DRESSINGS) ×1
BENZOIN TINCTURE PRP APPL 2/3 (GAUZE/BANDAGES/DRESSINGS) ×3 IMPLANT
BIT DRILL 3.2 (BIT) ×2
BIT DRILL 65X3.2XQC STP NS (BIT) IMPLANT
BIT DRL 65X3.2XQC STP NS (BIT) ×1
BLADE CLIPPER SURG (BLADE) IMPLANT
BONE VIVIGEN FORMABLE 5.4CC (Bone Implant) ×3 IMPLANT
BUR PRESCISION 1.7 ELITE (BURR) ×3 IMPLANT
BUR ROUND PRECISION 4.0 (BURR) IMPLANT
BUR ROUND PRECISION 4.0MM (BURR)
BUR SABER RD CUTTING 3.0 (BURR) IMPLANT
BUR SABER RD CUTTING 3.0MM (BURR)
CARTRIDGE OIL MAESTRO DRILL (MISCELLANEOUS) ×1 IMPLANT
CLOSURE STERI-STRIP 1/2X4 (GAUZE/BANDAGES/DRESSINGS) ×1
CLOSURE WOUND 1/2 X4 (GAUZE/BANDAGES/DRESSINGS) ×2
CLSR STERI-STRIP ANTIMIC 1/2X4 (GAUZE/BANDAGES/DRESSINGS) ×1 IMPLANT
CONNECTOR EXPEDIUM TI 55MM (Connector) ×6 IMPLANT
CONT SPEC 4OZ CLIKSEAL STRL BL (MISCELLANEOUS) ×3 IMPLANT
COVER MAYO STAND STRL (DRAPES) ×6 IMPLANT
COVER SURGICAL LIGHT HANDLE (MISCELLANEOUS) ×3 IMPLANT
DIFFUSER DRILL AIR PNEUMATIC (MISCELLANEOUS) ×3 IMPLANT
DRAIN CHANNEL 15F RND FF W/TCR (WOUND CARE) IMPLANT
DRAPE C-ARM 42X72 X-RAY (DRAPES) ×3 IMPLANT
DRAPE C-ARMOR (DRAPES) IMPLANT
DRAPE POUCH INSTRU U-SHP 10X18 (DRAPES) ×3 IMPLANT
DRAPE SURG 17X23 STRL (DRAPES) ×12 IMPLANT
DURAPREP 26ML APPLICATOR (WOUND CARE) ×3 IMPLANT
ELECT BLADE 4.0 EZ CLEAN MEGAD (MISCELLANEOUS) ×3
ELECT CAUTERY BLADE 6.4 (BLADE) ×3 IMPLANT
ELECT REM PT RETURN 9FT ADLT (ELECTROSURGICAL) ×3
ELECTRODE BLDE 4.0 EZ CLN MEGD (MISCELLANEOUS) ×1 IMPLANT
ELECTRODE REM PT RTRN 9FT ADLT (ELECTROSURGICAL) ×1 IMPLANT
EVACUATOR SILICONE 100CC (DRAIN) IMPLANT
FEE INTRAOP MONITOR IMPULS NCS (MISCELLANEOUS) IMPLANT
GAUZE SPONGE 4X4 12PLY STRL (GAUZE/BANDAGES/DRESSINGS) ×3 IMPLANT
GAUZE SPONGE 4X4 12PLY STRL LF (GAUZE/BANDAGES/DRESSINGS) ×2 IMPLANT
GAUZE SPONGE 4X4 16PLY XRAY LF (GAUZE/BANDAGES/DRESSINGS) ×3 IMPLANT
GLOVE BIO SURGEON STRL SZ7 (GLOVE) ×3 IMPLANT
GLOVE BIO SURGEON STRL SZ8 (GLOVE) ×3 IMPLANT
GLOVE BIOGEL PI IND STRL 7.0 (GLOVE) ×1 IMPLANT
GLOVE BIOGEL PI IND STRL 8 (GLOVE) ×1 IMPLANT
GLOVE BIOGEL PI INDICATOR 7.0 (GLOVE) ×2
GLOVE BIOGEL PI INDICATOR 8 (GLOVE) ×2
GOWN STRL REUS W/ TWL LRG LVL3 (GOWN DISPOSABLE) ×2 IMPLANT
GOWN STRL REUS W/ TWL XL LVL3 (GOWN DISPOSABLE) ×1 IMPLANT
GOWN STRL REUS W/TWL LRG LVL3 (GOWN DISPOSABLE) ×6
GOWN STRL REUS W/TWL XL LVL3 (GOWN DISPOSABLE) ×3
GRAFT BNE MATRIX VG FRMBL MD 5 (Bone Implant) IMPLANT
INTRAOP MONITOR FEE IMPULS NCS (MISCELLANEOUS) ×1
INTRAOP MONITOR FEE IMPULSE (MISCELLANEOUS) ×2
IV CATH 14GX2 1/4 (CATHETERS) ×3 IMPLANT
KIT BASIN OR (CUSTOM PROCEDURE TRAY) ×3 IMPLANT
KIT POSITION SURG JACKSON T1 (MISCELLANEOUS) ×3 IMPLANT
KIT ROOM TURNOVER OR (KITS) ×3 IMPLANT
MARKER SKIN DUAL TIP RULER LAB (MISCELLANEOUS) ×3 IMPLANT
NAIL TIBIAL PHOENIX 10.5X370MM (Nail) ×1 IMPLANT
NDL HYPO 25GX1X1/2 BEV (NEEDLE) ×1 IMPLANT
NDL SAFETY ECLIPSE 18X1.5 (NEEDLE) ×1 IMPLANT
NDL SPNL 18GX3.5 QUINCKE PK (NEEDLE) ×2 IMPLANT
NEEDLE 22X1 1/2 (OR ONLY) (NEEDLE) ×3 IMPLANT
NEEDLE HYPO 18GX1.5 SHARP (NEEDLE) ×3
NEEDLE HYPO 25GX1X1/2 BEV (NEEDLE) ×3 IMPLANT
NEEDLE SPNL 18GX3.5 QUINCKE PK (NEEDLE) ×6 IMPLANT
NS IRRIG 1000ML POUR BTL (IV SOLUTION) ×3 IMPLANT
OIL CARTRIDGE MAESTRO DRILL (MISCELLANEOUS) ×3
PACK LAMINECTOMY ORTHO (CUSTOM PROCEDURE TRAY) ×3 IMPLANT
PACK UNIVERSAL I (CUSTOM PROCEDURE TRAY) ×3 IMPLANT
PAD ARMBOARD 7.5X6 YLW CONV (MISCELLANEOUS) ×6 IMPLANT
PATTIES SURGICAL .5 X1 (DISPOSABLE) ×3 IMPLANT
PATTIES SURGICAL .5X1.5 (GAUZE/BANDAGES/DRESSINGS) ×3 IMPLANT
PROBE PEDCLE PROBE MAGSTM DISP (MISCELLANEOUS) ×2 IMPLANT
ROD EXPEDIUM PER BENT 65MM (Rod) ×2 IMPLANT
ROD STRT EXPEDIUM 5.5X60 (Rod) ×2 IMPLANT
SCREW SET SINGLE INNER (Screw) ×4 IMPLANT
SCREW VIPER CORT FIX 6X35 (Screw) ×4 IMPLANT
SPONGE INTESTINAL PEANUT (DISPOSABLE) ×3 IMPLANT
SPONGE SURGIFOAM ABS GEL 100 (HEMOSTASIS) ×3 IMPLANT
STRIP CLOSURE SKIN 1/2X4 (GAUZE/BANDAGES/DRESSINGS) ×4 IMPLANT
SURGIFLO W/THROMBIN 8M KIT (HEMOSTASIS) IMPLANT
SUT MNCRL AB 4-0 PS2 18 (SUTURE) ×3 IMPLANT
SUT VIC AB 0 CT1 18XCR BRD 8 (SUTURE) ×1 IMPLANT
SUT VIC AB 0 CT1 8-18 (SUTURE) ×3
SUT VIC AB 1 CT1 18XCR BRD 8 (SUTURE) ×1 IMPLANT
SUT VIC AB 1 CT1 8-18 (SUTURE) ×3
SUT VIC AB 2-0 CT2 18 VCP726D (SUTURE) ×3 IMPLANT
SYR 20CC LL (SYRINGE) ×3 IMPLANT
SYR BULB IRRIGATION 50ML (SYRINGE) ×3 IMPLANT
SYR CONTROL 10ML LL (SYRINGE) ×6 IMPLANT
SYR TB 1ML LUER SLIP (SYRINGE) ×3 IMPLANT
TAPE CLOTH SURG 6X10 WHT LF (GAUZE/BANDAGES/DRESSINGS) ×2 IMPLANT
TOWEL OR 17X24 6PK STRL BLUE (TOWEL DISPOSABLE) ×3 IMPLANT
TOWEL OR 17X26 10 PK STRL BLUE (TOWEL DISPOSABLE) ×3 IMPLANT
TRAY FOLEY W/METER SILVER 16FR (SET/KITS/TRAYS/PACK) ×3 IMPLANT
WATER STERILE IRR 1000ML POUR (IV SOLUTION) ×3 IMPLANT
YANKAUER SUCT BULB TIP NO VENT (SUCTIONS) ×3 IMPLANT

## 2016-11-13 NOTE — Progress Notes (Signed)
Patient to OR via OR staff, family at bedside going down with patient.

## 2016-11-13 NOTE — Op Note (Deleted)
  The note originally documented on this encounter has been moved the the encounter in which it belongs.  

## 2016-11-13 NOTE — Op Note (Signed)
Ralph [name], Ralph [name]              ACCOUNT NO.:  [id number]  MEDICAL RECORD NO.:  [id number]  LOCATION:                               FACILITY:  [facility]  PHYSICIAN:  Estill Bamberg, MD      DATE OF BIRTH:  September 02, 1965  DATE OF PROCEDURE:  11/13/2016                              OPERATIVE REPORT   PREOPERATIVE DIAGNOSIS:  L3-4 spinal stenosis, status post a previous L4- 5 fusion, 1 day status post anterior lumbar fusion, requiring posterior fusion with instrumentation.  POSTOPERATIVE DIAGNOSIS:  L3-4 spinal stenosis, status post a previous L4-5 fusion, 1 day status post anterior lumbar fusion, requiring posterior fusion with instrumentation.  PROCEDURE (STAGE II OF II): 1. L3-4 posterior spinal fusion. 2. Placement of segmental instrumentation, L3, bilateral, which was     connected to the previous L4-5 construct. 3. L3-4 decompression. 4. Use of morselized allograft-ViviGen. 5. Intraoperative use of fluoroscopy.  SURGEON:  Estill Bamberg, MD  ASSISTANT:  Jason Coop, PA-C.  ANESTHESIA:  General endotracheal anesthesia.  COMPLICATIONS:  None.  DISPOSITION:  Stable.  ESTIMATED BLOOD LOSS:  Minimal.  INDICATIONS FOR SURGERY:  Briefly, Ralph [name] is a pleasant 51 year old male who did undergo an anterior lumbar interbody fusion via a direct left-sided lateral approach on November 12, 2016.  The patient did present for posterior fixation and fusion with a decompression.  Please refer to my operative report dated November 12, 2016 for a full account of the patient's indications for surgery.  OPERATIVE DETAILS:  On November 13, 2016, the patient was brought to surgery and general endotracheal anesthesia was administered.  The patient was placed prone on a well-padded flat Jackson bed with a spinal frame.  Antibiotics were given.  A time-out procedure was performed and the back was prepped and draped.  A midline incision was then made in line with the patient's previous incision.  The  fascia was incised at the midline.  Of note, the patient did have a baclofen pump catheter that was in the immediate vicinity of the fascia and the region for exposure.  I did meticulously retract the fascia, with the attached catheter to the right side.  The subfascial part of the catheter was noted in the depths of the wound as well.  This was in the region of the dissection, but again, I was careful in the retraction of the catheter, in order to minimize any potential damage to the catheter, as the patient does continue to use the baclofen pump.  Once adequate retraction was obtained, I did retract the paraspinal muscles on the right and left sides.  The previously placed L4 and L5 instrumentation was noted.  There was abundant bone noted about the right pedicle screws and in the region of the interconnecting rod, particularly on the right side, but also on the left.  A high-speed bur was used to remove the abundance of the overgrowth of bone.  I then identified the L3-4 intervertebral space.  I did use a high-speed bur in order to perform a partial facetectomy on the right and left sides, in order to additionally decompress the spinal canal, given the patient's MRI findings.  I then identified the landmarks of  the L3 pedicles on the right and left sides.  Using AP and lateral fluoroscopy, I did use a 1.7 mm bur to identify the appropriate landmarks.  I then tapped up to a 6 mm tap and was very pleased with the press-fit of the tap.  A ball-tip probe was used to confirm there was no cortical violation.  At this point, a high-speed bur was used to decorticate the facet joints at L3-4 bilaterally.  I then placed 6 x 35 mm screws bilaterally into the L3 pedicles.  On the left, a 65 mm rod was placed and on the right, a 60 mm rod was placed, which did connect the bilateral L3 screws to the L4-5 interconnecting rod.  Universal connector was used to connect the newly placed screws to the  previously placed construct across the L4-5 level. Caps were then placed and a final locking procedure was performed.  I was very pleased with the appearance of the radiographs on both AP and lateral imaging.  The wound was then copiously irrigated with approximately 2 L of normal saline.  Of note, I did use triggered EMG to test the screw on the right at L3, and did test above 20 milliamps.  I then placed ViviGen into the posterolateral gutters and posterior elements on the right and left sides across the L3-4 intervertebral space, in order to aid in the success of the fusion.  The fascia was then closed using #1 Vicryl.  Once again, I was very careful in maintaining the position of the baclofen catheter, and I was careful and I did make every attempt to minimize any trauma to the catheter.  The remainder of the wound was then closed using 2-0 Vicryl followed by 4-0 Monocryl.  Benzoin and Steri-Strips were applied followed by sterile dressing.  All instrument counts were correct at the termination of the procedure.  Of note, Jason Coop was my assistant throughout surgery, and did aid in retraction, suctioning, and closure.  Of note, there was no abnormal EMG activity noted throughout the entire surgery.     Estill Bamberg, MD   ______________________________ Estill Bamberg, MD    MD/MEDQ  D:  11/13/2016  T:  11/13/2016  Job:  [id number]

## 2016-11-13 NOTE — Anesthesia Procedure Notes (Signed)
Procedure Name: Intubation Date/Time: 11/13/2016 7:39 AM Performed by: Jacquiline Doe A Pre-anesthesia Checklist: Patient identified, Emergency Drugs available, Suction available and Patient being monitored Patient Re-evaluated:Patient Re-evaluated prior to inductionOxygen Delivery Method: Circle System Utilized and Circle system utilized Preoxygenation: Pre-oxygenation with 100% oxygen Intubation Type: IV induction and Cricoid Pressure applied Ventilation: Mask ventilation without difficulty Laryngoscope Size: Mac and 4 Grade View: Grade I Tube type: Oral Tube size: 7.5 mm Number of attempts: 1 Airway Equipment and Method: Stylet Placement Confirmation: ETT inserted through vocal cords under direct vision,  positive ETCO2 and breath sounds checked- equal and bilateral Secured at: 23 cm Tube secured with: Tape Dental Injury: Teeth and Oropharynx as per pre-operative assessment

## 2016-11-13 NOTE — Op Note (Signed)
NAME:  KC, SUMMERSON NO.:  MEDICAL RECORD NO.:  [id number]  LOCATION:                                 FACILITY:  PHYSICIAN:  Estill Bamberg, MD      DATE OF BIRTH:  [birth year]  DATE OF PROCEDURE:  11/12/2016                              OPERATIVE REPORT   PREOPERATIVE DIAGNOSES: 1. Adjacent segment disease, L3-4. 2. Status post previous L4-5 fusion. 3. Grade 1 L3-4 spondylolisthesis. 4. Moderate L3-4 spinal stenosis resulting in bilateral leg pain.  POSTOPERATIVE DIAGNOSES: 1. Adjacent segment disease, L3-4. 2. Status post previous L4-5 fusion. 3. Grade 1 L3-4 spondylolisthesis. 4. Moderate L3-4 spinal stenosis resulting in bilateral leg pain.  PROCEDURE:  (Stage I of II) 1. Anterior lumbar interbody fusion via left-sided direct lateral     approach, L3-4. 2. Insertion of interbody device x1 (NuVasive 12 x 22 x 60 mm     intervertebral spacer). 3. Use of morselized allograft - ViviGen. 4. Placement of anterior instrumentation (NuVasive 14-mm 2-hole     lateral plate).  SURGEON:  Estill Bamberg, MD.  ASSISTANTJason Coop, PA-C.  ANESTHESIA:  General endotracheal anesthesia.  COMPLICATIONS:  None.  DISPOSITION:  Stable.  ESTIMATED BLOOD LOSS:  Minimal.  INDICATIONS FOR SURGERY:  Briefly, [name] is a pleasant 51 year old male who is approximately 3 years status post an L4-5 fusion procedure. The patient did very well from that surgery, but did more recently have pain in his bilateral legs.  An updated MRI did reveal excellent decompression at the L4-5 level, with moderate stenosis noted at L3-4. Given the patient's ongoing pain, we did discuss proceeding with the procedure reflected above.  The patient was fully aware of the risks and limitations of surgery and did elect to proceed.  OPERATIVE DETAILS:  On November 12, 2016, the patient was brought to Surgery and general endotracheal anesthesia was administered.  The patient was  placed in the lateral decubitus position with the left side up.  An axillary roll was placed under the patient's right axilla.  The patient's hips and knees were appropriately flexed.  The patient was secured to the bed using tape.  The bed was gently flexed in order to optimize exposure to the L3-4 intervertebral space.  The region of the left flank was then prepped and draped in the usual sterile fashion.  A transverse incision was then made in line with the L3-4 intervertebral space.  The external and internal oblique musculature were dissected as was the transversalis fascia.  The retroperitoneal space was encountered, and the peritoneum was bluntly swept anteriorly.  The psoas was readily identified.  I then placed the initial dilator, using neurologic monitoring, through the psoas.  There were no neurologic structures noted to be in the immediate vicinity of the dilator.  I then sequentially dilated, again liberally using neurologic monitoring, and there were no neurologic structures noted to be in the immediate vicinity of the dilator.  A self-retaining retractor was then placed and attached to a rigid arm, anchored to the bed.  The retractor was gently opened and the intervertebral space at L3-4 was readily encountered.  I did use  a testing probe to test for any neurologic structures in the immediate vicinity of the exposure, and there were none.  At this point, I did perform an annulotomy using a knife.  I then used a series of paddle scrapers and pituitary rongeurs and curettes in order to perform a thorough and complete L3-4 intervertebral diskectomy.  The contralateral anulus was released using a Cobb.  Excellent mobility was noted across the L3-4 intervertebral space.  Once the endplates were appropriately prepared, I placed a series of trials.  I did ultimately select a 12 x 22 x 60 mm intervertebral spacer, which was packed with ViviGen and tamped into position in the usual  fashion.  I was very pleased with the press-fit of the implant and the restoration of intervertebral disk height.  I was very pleased with the final AP and lateral fluoroscopic images.  The wound was then copiously irrigated.  A 14-mm lateral plate was then placed over the L3-4 intervertebral space. 50 mm screws were then placed into the L3 and L4 vertebral bodies.  The screws were then locked to the plate.  The bed was then leveled, and the plate was locked as well.  I was very pleased with the final AP and lateral fluoroscopic images.  The wound was again irrigated.  The fascia was then closed using 0 Vicryl and the skin was closed using 2-0 Vicryl followed by 3-0 Monocryl.  Benzoin and Steri-Strips were applied followed by sterile dressing.  All instrument counts were correct at the termination of the procedure.  Of note, there was no abnormal sustained EMG activity noted throughout the entire surgery. Of note, Jason Coop was my assistant throughout surgery, and did aid in retraction, suctioning, and closure from start to finish.  The postoperative plan is to bring the patient to surgery tomorrow for stage II of his 2-stage procedure, which will specifically involve a posterior fusion with instrumentation and potentially decompression.     Estill Bamberg, MD     MD/MEDQ  D:  11/12/2016  T:  11/12/2016  Job:  [id number]

## 2016-11-13 NOTE — Care Management Note (Addendum)
Case Management Note  Patient Details  Name: CORDERRO KOLOSKI MRN: [id number] Date of Birth: [birth year]  Subjective/Objective:   Pt underwent:   LEFT SIDED LUMBAR 3-4 LATERAL INTERBODY FUSION WITH INSTRUMENTATION AND ALLOGRAFT.  He is from home with his spouse.               Action/Plan: Awaiting PT/OT recommendations. CM following for d/c needs, physician orders.   Addendum 4/13 Dante Roudebush RN CM No DC needs identified. DC order in place to DC to home.  Addendum 14:11 Lawerance Sabal RN CM Spoke with patient, he would like to use [facility] for [facility] PT. He states he has had the same surgery in the past and has all DME already. CM requested HH and F2F orders from [facility] office. CM placed HH order w/o F2F in order to fax referral to [facility] as they requested.   Expected Discharge Date:                  Expected Discharge Plan:     In-House Referral:     Discharge planning Services     Post Acute Care Choice:    Choice offered to:     DME Arranged:    DME Agency:     HH Arranged:    HH Agency:     Status of Service:  In process, will continue to follow  If discussed at Long Length of Stay Meetings, dates discussed:    Additional Comments:  Kermit Balo, RN 11/13/2016, 2:05 PM

## 2016-11-13 NOTE — Transfer of Care (Signed)
Immediate Anesthesia Transfer of Care Note  Patient: Ralph [name]  Procedure(s) Performed: Procedure(s) with comments: LUMBAR 3-4 POSTERIOR LUMBAR FUSION WITH INSTRUMENTATION AND ALLOGRAFT (Bilateral) - LUMBAR 3-4 POSTERIOR LUMBAR FUSION WITH INSTRUMENTATION AND ALLOGRAFT  Patient Location: PACU  Anesthesia Type:General  Level of Consciousness: awake, oriented, sedated, patient cooperative and responds to stimulation  Airway & Oxygen Therapy: Patient Spontanous Breathing and Patient connected to nasal cannula oxygen  Post-op Assessment: Report given to RN, Post -op Vital signs reviewed and stable, Patient moving all extremities and Patient moving all extremities X 4  Post vital signs: Reviewed and stable  Last Vitals:  Vitals:   11/13/16 0536 11/13/16 1100  BP: 140/69   Pulse: 83   Resp: 20   Temp: 37 C 36.6 C    Last Pain:  Vitals:   11/13/16 0536  TempSrc: Oral  PainSc:       Patients Stated Pain Goal: 4 (11/12/16 2052)  Complications: No apparent anesthesia complications

## 2016-11-14 MED FILL — Thrombin For Soln 20000 Unit: CUTANEOUS | Qty: 1 | Status: AC

## 2016-11-14 NOTE — Anesthesia Postprocedure Evaluation (Signed)
Anesthesia Post Note  Patient: Ralph [name]  Procedure(s) Performed: Procedure(s) (LRB): LUMBAR 3-4 POSTERIOR LUMBAR FUSION WITH INSTRUMENTATION AND ALLOGRAFT (Bilateral)  Patient location during evaluation: PACU Anesthesia Type: General Level of consciousness: awake and alert Pain management: pain level controlled Vital Signs Assessment: post-procedure vital signs reviewed and stable Respiratory status: spontaneous breathing, nonlabored ventilation, respiratory function stable and patient connected to nasal cannula oxygen Cardiovascular status: blood pressure returned to baseline and stable Postop Assessment: no signs of nausea or vomiting Anesthetic complications: no       Last Vitals:  Vitals:   11/14/16 0100 11/14/16 0613  BP: 127/75 (!) 118/57  Pulse: 93 90  Resp: 18 18  Temp: 36.6 C 37.7 C    Last Pain:  Vitals:   11/14/16 0703  TempSrc:   PainSc: Asleep                 [name] EDWARD

## 2016-11-14 NOTE — Progress Notes (Signed)
    Patient doing well Patient reports resolution of R and L leg pain Has been ambulating Has been ambuating  Physical Exam: Vitals:   11/14/16 0100 11/14/16 0613  BP: 127/75 (!) 118/57  Pulse: 93 90  Resp: 18 18  Temp: 97.9 F (36.6 C) 99.9 F (37.7 C)    Dressing in place NV status at baseline  POD s/p A/P L3/4 fusion, doing well. Patient's L thigh pain very likely from L transpsoas approach and is very likely to resolve.  - up with PT/OT, encourage ambulation - Percocet for pain, Valium for muscle spasms - likely d/c home today with f/u in 2 weeks

## 2016-11-14 NOTE — Evaluation (Signed)
Physical Therapy Evaluation Patient Details Name: Ralph [name] MRN: [id number] DOB: Apr 23, 1966 Today's Date: 11/14/2016   History of Present Illness  Pt is 51 y/o male s/p L3-4 L lateral interbody fusion on 11/12/16; L3-4 Posterior lumbar decompression and fusion on 11/13/16.  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   Clinical Impression  Patient is s/p above surgery resulting in the deficits listed below (see PT Problem List). PTA, pt required use of cane and walker for mobility secondary to SCI. Pt reports he has residual weakness and sensory issues in all limbs. Upon evaluation, pt requiring from min guard to min A for mobility. Pt reporting other than the pain, his balance and gait is at baseline, however, cannot walk as far secondary to pain. Educated about HHPT recommendations to increase safety at home. Pt concerned insurance will not cover and pt cannot pay out of pocket. HEP administered to pt, in case pt refuses HHPT. Patient will benefit from skilled PT to increase their independence and safety with mobility (while adhering to their precautions) to allow discharge to the venue listed below. Will continue to follow to maximize functional mobility independence.      Follow Up Recommendations Home health PT;Supervision/Assistance - 24 hour    Equipment Recommendations  None recommended by PT    Recommendations for Other Services       Precautions / Restrictions Precautions Precautions: Back Precaution Booklet Issued: Yes (comment) Precaution Comments: Reviewed precautions with pt and pt's daughter Required Braces or Orthoses: Spinal Brace Spinal Brace: Thoracolumbosacral orthotic;Applied in sitting position Restrictions Weight Bearing Restrictions: No      Mobility  Bed Mobility Overal bed mobility: Needs Assistance Bed Mobility: Rolling;Sidelying to Sit;Sit to Sidelying Rolling: Min assist Sidelying to sit: Min  assist     Sit to sidelying: Min assist General bed mobility comments: Pt requiring min A for rolling secondary to pain. Required min A for trunk elevation to come up to sitting and LE management to come to sidelying. Pt reporting he needs assist at baseline secondary to LE weakness from SCI.   Transfers Overall transfer level: Needs assistance Equipment used: Rolling walker (2 wheeled) Transfers: Sit to/from Stand Sit to Stand: Min guard         General transfer comment: Min guard for safety. Pt with good technique to come to standing. Verbal cues to power through legs.   Ambulation/Gait Ambulation/Gait assistance: Min guard;Min assist Ambulation Distance (Feet): 125 Feet Assistive device: Rolling walker (2 wheeled) Gait Pattern/deviations: Step-through pattern;Decreased stride length;Trunk flexed Gait velocity: Decreased Gait velocity interpretation: Below normal speed for age/gender General Gait Details: Crouched gait at baseline secondary to SCI. Pt fatiguing at end of session, requiring multiple standing rest breaks and min A for steadying. Pt reports increased pain which limited tolerance. Pt reports fatigue occurs at baseline.   Stairs Stairs: Yes Stairs assistance: Min guard Stair Management: With walker;Forwards;Step to pattern Number of Stairs: 4 General stair comments: Practiced on threshold step like at home X 4. Educated about LE sequencing. Min guard for safety. Pt demonstrating good technique.   Wheelchair Mobility    Modified Rankin (Stroke Patients Only)       Balance Overall balance assessment: Needs assistance Sitting-balance support: No upper extremity supported;Feet supported Sitting balance-Leahy Scale: Good     Standing balance support: Bilateral upper extremity supported;During functional activity Standing balance-Leahy Scale: Poor Standing balance comment: Reliant on RW  Pertinent Vitals/Pain Pain  Assessment: 0-10 Pain Score: 8  Pain Location: back  Pain Descriptors / Indicators: Operative site guarding;Aching;Sore Pain Intervention(s): Limited activity within patient's tolerance;Monitored during session;Repositioned;Premedicated before session    Home Living Family/patient expects to be discharged to:: Private residence Living Arrangements: Spouse/significant other;Other (Comment) (daughter) Available Help at Discharge: Family;Available 24 hours/day Type of Home: House Home Access: Stairs to enter Entrance Stairs-Rails: None (beam ) Entrance Stairs-Number of Steps: 2 Home Layout: One level Home Equipment: Shower seat;Bedside commode;Walker - 2 wheels;Hand held shower head;Cane - single point      Prior Function Level of Independence: Independent with assistive device(s)         Comments: Uses cane and walker to walk at baseline secondary to deficits from SCI.      Hand Dominance   Dominant Hand: Right    Extremity/Trunk Assessment   Upper Extremity Assessment Upper Extremity Assessment: LUE deficits/detail LUE Deficits / Details: SCI at baseline; unable to open/close hand and atrophy in arm.     Lower Extremity Assessment Lower Extremity Assessment: RLE deficits/detail;LLE deficits/detail RLE Deficits / Details: Weakness at baseline secondary to SCI LLE Deficits / Details: Weakness at baseline secondary to SCI    Cervical / Trunk Assessment Cervical / Trunk Assessment: Other exceptions Cervical / Trunk Exceptions: s/p surgery   Communication   Communication: No difficulties  Cognition Arousal/Alertness: Awake/alert Behavior During Therapy: WFL for tasks assessed/performed Overall Cognitive Status: Within Functional Limits for tasks assessed                                        General Comments General comments (skin integrity, edema, etc.): Educated pt and daughter about activity pacing upon return home. Pt reports he feels like he is  baseline other than pain level. Educated about HHPT, however, pt concerned insurance will not cover. Reviewed HEP for home management in case pt refuses HHPT.     Exercises     Assessment/Plan    PT Assessment Patient needs continued PT services  PT Problem List Decreased strength;Decreased activity tolerance;Decreased balance;Decreased mobility;Decreased coordination;Decreased knowledge of precautions;Pain       PT Treatment Interventions DME instruction;Gait training;Stair training;Therapeutic activities;Functional mobility training;Therapeutic exercise;Balance training;Neuromuscular re-education;Patient/family education    PT Goals (Current goals can be found in the Care Plan section)  Acute Rehab PT Goals Patient Stated Goal: to decrease pain and to go home  PT Goal Formulation: With patient/family Time For Goal Achievement: 11/21/16 Potential to Achieve Goals: Good    Frequency Min 5X/week   Barriers to discharge        Co-evaluation               End of Session Equipment Utilized During Treatment: Gait belt;Back brace Activity Tolerance: Patient limited by pain;Patient limited by fatigue Patient left: in bed;with call bell/phone within reach;with family/visitor present Nurse Communication: Mobility status PT Visit Diagnosis: Unsteadiness on feet (R26.81);Other symptoms and signs involving the nervous system (R29.898);Pain Pain - part of body:  (back )    Time: 1610-9604 PT Time Calculation (min) (ACUTE ONLY): 51 min   Charges:   PT Evaluation $PT Eval Moderate Complexity: 1 Procedure PT Treatments $Gait Training: 23-37 mins   PT G Codes:        Margot Chimes, PT, DPT  Acute Rehabilitation Services  Pager: [phone]   Melvyn Novas 11/14/2016, 1:11 PM

## 2016-11-14 NOTE — Progress Notes (Signed)
Pt requested therapy before discharging home. Per Md note, PT/OT ordered and pt evaluated. PT recommended home health PT. Notified case manager to speak with pt about other home needs. Pt agreed then refused home health at this time stating that he needed to speak with his insurance first to make sure visits would be covered.  Resources with education provided.  Pt discharged home with daughter taking all personal belongings. Discharge instructions and prescriptions with follow up appt provided with verbal understanding. Surgical dressing site dry and intact. No noted distress.
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
## 2016-11-20 ENCOUNTER — Telehealth: Payer: Self-pay | Admitting: *Deleted

## 2016-11-20 DIAGNOSIS — G8929 Other chronic pain: Secondary | ICD-10-CM | POA: Diagnosis not present

## 2016-11-20 DIAGNOSIS — I10 Essential (primary) hypertension: Secondary | ICD-10-CM | POA: Diagnosis not present

## 2016-11-20 DIAGNOSIS — M4316 Spondylolisthesis, lumbar region: Secondary | ICD-10-CM | POA: Diagnosis not present

## 2016-11-20 DIAGNOSIS — M48061 Spinal stenosis, lumbar region without neurogenic claudication: Secondary | ICD-10-CM | POA: Diagnosis not present

## 2016-11-20 DIAGNOSIS — M1711 Unilateral primary osteoarthritis, right knee: Secondary | ICD-10-CM | POA: Diagnosis not present

## 2016-11-20 DIAGNOSIS — Z4789 Encounter for other orthopedic aftercare: Secondary | ICD-10-CM | POA: Diagnosis not present

## 2016-11-20 NOTE — Telephone Encounter (Signed)
I called the pt to let him know Kt was on vacation.  After I got speaking with him I saw the issues he was having. Have emailed Amy the manager at [employer].  Told him that Olegario Messier or I would call him back on Monday 11/24/2016

## 2016-11-20 NOTE — Telephone Encounter (Signed)
Pt needs an explanation of his bill.

## 2016-11-20 NOTE — Telephone Encounter (Signed)
Amy fixed account and issued refund to pt

## 2016-11-20 NOTE — Telephone Encounter (Signed)
I have looked at this account.  Olegario Messier has been working with it.  I will make a note for her to look at it on Monday and call Mr. Mees about his bill.

## 2016-11-26 DIAGNOSIS — M48061 Spinal stenosis, lumbar region without neurogenic claudication: Secondary | ICD-10-CM | POA: Diagnosis not present

## 2016-11-26 DIAGNOSIS — I10 Essential (primary) hypertension: Secondary | ICD-10-CM | POA: Diagnosis not present

## 2016-11-26 DIAGNOSIS — M4316 Spondylolisthesis, lumbar region: Secondary | ICD-10-CM | POA: Diagnosis not present

## 2016-11-26 DIAGNOSIS — M1711 Unilateral primary osteoarthritis, right knee: Secondary | ICD-10-CM | POA: Diagnosis not present

## 2016-11-26 DIAGNOSIS — G8929 Other chronic pain: Secondary | ICD-10-CM | POA: Diagnosis not present

## 2016-11-26 DIAGNOSIS — Z4789 Encounter for other orthopedic aftercare: Secondary | ICD-10-CM | POA: Diagnosis not present

## 2016-11-28 DIAGNOSIS — Z4789 Encounter for other orthopedic aftercare: Secondary | ICD-10-CM | POA: Diagnosis not present

## 2016-11-28 DIAGNOSIS — M4316 Spondylolisthesis, lumbar region: Secondary | ICD-10-CM | POA: Diagnosis not present

## 2016-11-28 DIAGNOSIS — M1711 Unilateral primary osteoarthritis, right knee: Secondary | ICD-10-CM | POA: Diagnosis not present

## 2016-11-28 DIAGNOSIS — M48061 Spinal stenosis, lumbar region without neurogenic claudication: Secondary | ICD-10-CM | POA: Diagnosis not present

## 2016-11-28 DIAGNOSIS — I10 Essential (primary) hypertension: Secondary | ICD-10-CM | POA: Diagnosis not present

## 2016-11-28 DIAGNOSIS — G8929 Other chronic pain: Secondary | ICD-10-CM | POA: Diagnosis not present

## 2016-12-01 DIAGNOSIS — Z4789 Encounter for other orthopedic aftercare: Secondary | ICD-10-CM | POA: Diagnosis not present

## 2016-12-01 DIAGNOSIS — M48061 Spinal stenosis, lumbar region without neurogenic claudication: Secondary | ICD-10-CM | POA: Diagnosis not present

## 2016-12-01 DIAGNOSIS — G8929 Other chronic pain: Secondary | ICD-10-CM | POA: Diagnosis not present

## 2016-12-01 DIAGNOSIS — M4316 Spondylolisthesis, lumbar region: Secondary | ICD-10-CM | POA: Diagnosis not present

## 2016-12-01 DIAGNOSIS — M1711 Unilateral primary osteoarthritis, right knee: Secondary | ICD-10-CM | POA: Diagnosis not present

## 2016-12-01 DIAGNOSIS — I10 Essential (primary) hypertension: Secondary | ICD-10-CM | POA: Diagnosis not present

## 2016-12-02 DIAGNOSIS — G4733 Obstructive sleep apnea (adult) (pediatric): Secondary | ICD-10-CM | POA: Diagnosis not present

## 2016-12-02 DIAGNOSIS — I2782 Chronic pulmonary embolism: Secondary | ICD-10-CM | POA: Diagnosis not present

## 2016-12-02 DIAGNOSIS — G4734 Idiopathic sleep related nonobstructive alveolar hypoventilation: Secondary | ICD-10-CM | POA: Diagnosis not present

## 2016-12-02 DIAGNOSIS — M4800 Spinal stenosis, site unspecified: Secondary | ICD-10-CM | POA: Diagnosis not present

## 2016-12-02 DIAGNOSIS — R0602 Shortness of breath: Secondary | ICD-10-CM | POA: Diagnosis not present

## 2016-12-02 DIAGNOSIS — R269 Unspecified abnormalities of gait and mobility: Secondary | ICD-10-CM | POA: Diagnosis not present

## 2016-12-03 DIAGNOSIS — G8929 Other chronic pain: Secondary | ICD-10-CM | POA: Diagnosis not present

## 2016-12-03 DIAGNOSIS — M4316 Spondylolisthesis, lumbar region: Secondary | ICD-10-CM | POA: Diagnosis not present

## 2016-12-03 DIAGNOSIS — M1711 Unilateral primary osteoarthritis, right knee: Secondary | ICD-10-CM | POA: Diagnosis not present

## 2016-12-03 DIAGNOSIS — I10 Essential (primary) hypertension: Secondary | ICD-10-CM | POA: Diagnosis not present

## 2016-12-03 DIAGNOSIS — M48061 Spinal stenosis, lumbar region without neurogenic claudication: Secondary | ICD-10-CM | POA: Diagnosis not present

## 2016-12-03 DIAGNOSIS — Z4789 Encounter for other orthopedic aftercare: Secondary | ICD-10-CM | POA: Diagnosis not present

## 2016-12-09 DIAGNOSIS — I10 Essential (primary) hypertension: Secondary | ICD-10-CM | POA: Diagnosis not present

## 2016-12-09 DIAGNOSIS — G8929 Other chronic pain: Secondary | ICD-10-CM | POA: Diagnosis not present

## 2016-12-09 DIAGNOSIS — M4316 Spondylolisthesis, lumbar region: Secondary | ICD-10-CM | POA: Diagnosis not present

## 2016-12-09 DIAGNOSIS — Z4789 Encounter for other orthopedic aftercare: Secondary | ICD-10-CM | POA: Diagnosis not present

## 2016-12-09 DIAGNOSIS — M1711 Unilateral primary osteoarthritis, right knee: Secondary | ICD-10-CM | POA: Diagnosis not present

## 2016-12-09 DIAGNOSIS — M48061 Spinal stenosis, lumbar region without neurogenic claudication: Secondary | ICD-10-CM | POA: Diagnosis not present

## 2016-12-11 DIAGNOSIS — G8929 Other chronic pain: Secondary | ICD-10-CM | POA: Diagnosis not present

## 2016-12-11 DIAGNOSIS — M48061 Spinal stenosis, lumbar region without neurogenic claudication: Secondary | ICD-10-CM | POA: Diagnosis not present

## 2016-12-11 DIAGNOSIS — Z4789 Encounter for other orthopedic aftercare: Secondary | ICD-10-CM | POA: Diagnosis not present

## 2016-12-11 DIAGNOSIS — I10 Essential (primary) hypertension: Secondary | ICD-10-CM | POA: Diagnosis not present

## 2016-12-11 DIAGNOSIS — M4316 Spondylolisthesis, lumbar region: Secondary | ICD-10-CM | POA: Diagnosis not present

## 2016-12-11 DIAGNOSIS — M1711 Unilateral primary osteoarthritis, right knee: Secondary | ICD-10-CM | POA: Diagnosis not present

## 2016-12-15 DIAGNOSIS — G8929 Other chronic pain: Secondary | ICD-10-CM | POA: Diagnosis not present

## 2016-12-15 DIAGNOSIS — I10 Essential (primary) hypertension: Secondary | ICD-10-CM | POA: Diagnosis not present

## 2016-12-15 DIAGNOSIS — M4316 Spondylolisthesis, lumbar region: Secondary | ICD-10-CM | POA: Diagnosis not present

## 2016-12-15 DIAGNOSIS — M48061 Spinal stenosis, lumbar region without neurogenic claudication: Secondary | ICD-10-CM | POA: Diagnosis not present

## 2016-12-15 DIAGNOSIS — Z4789 Encounter for other orthopedic aftercare: Secondary | ICD-10-CM | POA: Diagnosis not present

## 2016-12-15 DIAGNOSIS — M1711 Unilateral primary osteoarthritis, right knee: Secondary | ICD-10-CM | POA: Diagnosis not present

## 2016-12-16 ENCOUNTER — Telehealth: Payer: Self-pay | Admitting: *Deleted

## 2016-12-16 NOTE — Telephone Encounter (Signed)
NOTES SENT TO SCHEDULING.  °

## 2016-12-18 DIAGNOSIS — M1711 Unilateral primary osteoarthritis, right knee: Secondary | ICD-10-CM | POA: Diagnosis not present

## 2016-12-18 DIAGNOSIS — G8929 Other chronic pain: Secondary | ICD-10-CM | POA: Diagnosis not present

## 2016-12-18 DIAGNOSIS — I10 Essential (primary) hypertension: Secondary | ICD-10-CM | POA: Diagnosis not present

## 2016-12-18 DIAGNOSIS — M48061 Spinal stenosis, lumbar region without neurogenic claudication: Secondary | ICD-10-CM | POA: Diagnosis not present

## 2016-12-18 DIAGNOSIS — M4316 Spondylolisthesis, lumbar region: Secondary | ICD-10-CM | POA: Diagnosis not present

## 2016-12-18 DIAGNOSIS — Z4789 Encounter for other orthopedic aftercare: Secondary | ICD-10-CM | POA: Diagnosis not present

## 2016-12-23 ENCOUNTER — Encounter (HOSPITAL_COMMUNITY): Payer: Self-pay | Admitting: Orthopedic Surgery

## 2016-12-23 DIAGNOSIS — M5416 Radiculopathy, lumbar region: Secondary | ICD-10-CM | POA: Diagnosis not present

## 2016-12-24 ENCOUNTER — Encounter (HOSPITAL_COMMUNITY): Payer: Self-pay | Admitting: Orthopedic Surgery

## 2016-12-24 NOTE — OR Nursing (Signed)
Implant added to record 60mm rod to complete documentation.

## 2016-12-31 ENCOUNTER — Ambulatory Visit (INDEPENDENT_AMBULATORY_CARE_PROVIDER_SITE_OTHER): Payer: Medicare PPO | Admitting: Neurology

## 2016-12-31 ENCOUNTER — Encounter: Payer: Self-pay | Admitting: Neurology

## 2016-12-31 VITALS — BP 115/73 | HR 90 | Resp 20 | Ht 72.0 in | Wt 219.0 lb

## 2016-12-31 DIAGNOSIS — G8250 Quadriplegia, unspecified: Secondary | ICD-10-CM

## 2016-12-31 MED ORDER — BACLOFEN 40 MG/20ML IT SOLN
40.0000 mg | Freq: Once | INTRATHECAL | Status: AC
  Administered 2016-12-31: 40 mg via INTRATHECAL

## 2016-12-31 NOTE — Procedures (Signed)
      History:  Ralph NoonDarryl [name] is a 51 year old gentleman with a history of a spastic quadriparesis, he has baclofen pump in place and comes in for a baclofen pump refill. He has had recent low back surgery in April 2018, he is recovering from this. Overall, his spasticity level has not changed.  Baclofen pump refill note  The baclofen pump site was cleaned with Betadine solution. A 21-gauge needle was inserted into the pump port site. Approximately 4cc cc of residual baclofen was removed. 20 cc of replacement baclofen was placed into the pump at 2000 mcg/cc concentration.  The pump was reprogrammed for the following settings: The rate was kept the same at 10.2 g per hour basal rate. Between 12 midnight and 3 AM the rate goes up to 34.5 mcg/h. From 9 PM until midnight the rate again is 34.5 g per hour. The total daily dose is 252.6 g per day.  The alarm volume is set at 2.0 cc The next alarm date is 05/22/2017..  The patient tolerated the procedure well. There were no complications of the above procedure.  The NDC number is [id number] The baclofen expiration date is 03/04/2019. The baclofen lot number is [id number].

## 2017-01-02 DIAGNOSIS — G4734 Idiopathic sleep related nonobstructive alveolar hypoventilation: Secondary | ICD-10-CM | POA: Diagnosis not present

## 2017-01-02 DIAGNOSIS — R269 Unspecified abnormalities of gait and mobility: Secondary | ICD-10-CM | POA: Diagnosis not present

## 2017-01-02 DIAGNOSIS — I2782 Chronic pulmonary embolism: Secondary | ICD-10-CM | POA: Diagnosis not present

## 2017-01-02 DIAGNOSIS — G4733 Obstructive sleep apnea (adult) (pediatric): Secondary | ICD-10-CM | POA: Diagnosis not present

## 2017-01-02 DIAGNOSIS — R0602 Shortness of breath: Secondary | ICD-10-CM | POA: Diagnosis not present

## 2017-01-02 DIAGNOSIS — M4800 Spinal stenosis, site unspecified: Secondary | ICD-10-CM | POA: Diagnosis not present

## 2017-01-02 NOTE — Addendum Note (Signed)
Addendum  created 01/02/17 1336 by Wash Nienhaus, MD   Sign clinical note    

## 2017-01-05 ENCOUNTER — Encounter: Payer: Self-pay | Admitting: Cardiology

## 2017-01-05 ENCOUNTER — Encounter (INDEPENDENT_AMBULATORY_CARE_PROVIDER_SITE_OTHER): Payer: Self-pay

## 2017-01-05 ENCOUNTER — Ambulatory Visit (INDEPENDENT_AMBULATORY_CARE_PROVIDER_SITE_OTHER): Payer: Medicare PPO | Admitting: Cardiology

## 2017-01-05 VITALS — BP 136/72 | HR 68 | Ht 72.0 in | Wt 207.0 lb

## 2017-01-05 DIAGNOSIS — Q211 Atrial septal defect: Secondary | ICD-10-CM | POA: Diagnosis not present

## 2017-01-05 DIAGNOSIS — Q2112 Patent foramen ovale: Secondary | ICD-10-CM

## 2017-01-05 DIAGNOSIS — R600 Localized edema: Secondary | ICD-10-CM | POA: Diagnosis not present

## 2017-01-05 DIAGNOSIS — I10 Essential (primary) hypertension: Secondary | ICD-10-CM

## 2017-01-05 HISTORY — DX: Atrial septal defect: Q21.1

## 2017-01-05 HISTORY — DX: Patent foramen ovale: Q21.12

## 2017-01-05 NOTE — Patient Instructions (Signed)
Medication Instructions:  Your physician recommends that you continue on your current medications as directed. Please refer to the Current Medication list given to you today.   Labwork: TODAY: TSH  You will receive instructions for your 24 hour urine specimen as well.  Testing/Procedures: Your physician has requested that you have an echocardiogram. Echocardiography is a painless test that uses sound waves to create images of your heart. It provides your doctor with information about the size and shape of your heart and how well your heart's chambers and valves are working. This procedure takes approximately one hour. There are no restrictions for this procedure.   Your physician has requested that you have a lower extremity arterial duplex. During this test, ultrasound is used to evaluate arterial blood flow in the legs. Allow one hour for this exam. There are no restrictions or special instructions.   Your physician has requested that you have a renal artery duplex. During this test, an ultrasound is used to evaluate blood flow to the kidneys. Allow one hour for this exam. Do not eat after midnight the day before and avoid carbonated beverages. Take your medications as you usually do.  Follow-Up: Your physician recommends that you schedule a follow-up appointment in 3 months with Dr. Mayford Knifeurner.  Any Other Special Instructions Will Be Listed Below (If Applicable).     If you need a refill on your cardiac medications before your next appointment, please call your pharmacy.
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
## 2017-01-06 DIAGNOSIS — I10 Essential (primary) hypertension: Secondary | ICD-10-CM | POA: Diagnosis not present

## 2017-01-07 DIAGNOSIS — I10 Essential (primary) hypertension: Secondary | ICD-10-CM | POA: Diagnosis not present

## 2017-01-08 LAB — ALDOSTERONE + RENIN ACTIVITY W/ RATIO
ALDOS/RENIN RATIO: 0.2 (ref 0.0–30.0)
ALDOSTERONE: 1 ng/dL (ref 0.0–30.0)
Renin: 4.409 ng/mL/hr (ref 0.167–5.380)

## 2017-01-08 LAB — TSH: TSH: 1.05 u[IU]/mL (ref 0.450–4.500)

## 2017-01-09 LAB — CORTISOL, URINE, FREE
Cortisol (Ur), Free: 17 ug/24 hr (ref 0–50)
Cortisol,F,ug/L,U: 6 ug/L

## 2017-01-09 LAB — CATECHOLAMINES, FRACTIONATED, URINE, 24 HOUR
Dopamine , 24H Ur: 98 ug/24 hr (ref 0–510)
Dopamine, Rand Ur: 49 ug/L
EPINEPHRINE 24H UR: 2 ug/(24.h) (ref 0–20)
EPINEPHRINE RAND UR: 1 ug/L
NOREPINEPH RAND UR: 14 ug/L
NOREPINEPHRINE 24H UR: 28 ug/(24.h) (ref 0–135)

## 2017-01-09 LAB — CREATININE CLEARANCE, URINE, 24 HOUR
CREATININE 24H UR: 964 mg/(24.h) — AB (ref 1000–2000)
CREATININE, UR: 48.2 mg/dL
CREATININE: 1.01 mg/dL (ref 0.76–1.27)
Creatinine Clearance: 66 mL/min — ABNORMAL LOW (ref 97–137)
GFR, EST AFRICAN AMERICAN: 100 mL/min/{1.73_m2} (ref >59)
GFR, EST NON AFRICAN AMERICAN: 87 mL/min/{1.73_m2} (ref >59)

## 2017-01-09 LAB — METANEPHRINES, URINE, 24 HOUR
Metaneph Total, Ur: 40 ug/L
Metanephrines, 24H Ur: 80 ug/24 hr (ref 45–290)
NORMETANEPHRINE UR: 116 ug/L
Normetanephrine, 24H Ur: 232 ug/24 hr (ref 82–500)

## 2017-01-09 LAB — 5 HIAA, QUANTITATIVE, URINE, 24 HOUR
5-HIAA, Ur: 1.8 mg/L
5-HIAA,QUANT.,24 HR URINE: 5 mg/(24.h) (ref 0.0–14.9)

## 2017-01-13 ENCOUNTER — Telehealth: Payer: Self-pay | Admitting: Adult Health

## 2017-01-13 NOTE — Telephone Encounter (Signed)
[facility].

## 2017-01-13 NOTE — Telephone Encounter (Signed)
Pt r/s appt from tomorrow to 8/15. He is wanting confirmation that it is ok to push the appt to August and it will not interfere with insurance paying and his CPAP

## 2017-01-14 ENCOUNTER — Ambulatory Visit: Payer: Medicare PPO | Admitting: Adult Health

## 2017-01-14 NOTE — Telephone Encounter (Signed)
Per [facility]: "Ellwood HandlerHey Ralph [name],  I am checking on this. It appears he has had his cpap since April 2017 so I do not think for insurance reason's we would be on a time frame now. However I have sent a message to our cpap team to look and confirm.   I will let you know. Thanks. "

## 2017-01-16 ENCOUNTER — Other Ambulatory Visit: Payer: Self-pay

## 2017-01-16 ENCOUNTER — Ambulatory Visit (HOSPITAL_COMMUNITY): Payer: Medicare PPO | Attending: Cardiology

## 2017-01-16 DIAGNOSIS — I10 Essential (primary) hypertension: Secondary | ICD-10-CM | POA: Insufficient documentation

## 2017-01-16 DIAGNOSIS — Q211 Atrial septal defect: Secondary | ICD-10-CM

## 2017-01-16 DIAGNOSIS — I340 Nonrheumatic mitral (valve) insufficiency: Secondary | ICD-10-CM | POA: Insufficient documentation

## 2017-01-16 DIAGNOSIS — Q2112 Patent foramen ovale: Secondary | ICD-10-CM

## 2017-01-20 NOTE — Telephone Encounter (Signed)
Per [facility]: "Ellwood HandlerHey [name],  I have received confirmation that pt does not need an appt before August. Pt's pap unit has converted to pt owned and no compliance notes are needed until pt needs supplies.   Thanks  Angie "

## 2017-01-20 NOTE — Telephone Encounter (Signed)
[facility] called pt and [facility] fine. Pt knows he needs to follow up at least yearly for his cpap to continue receiving a RX for cpap supplies. Pt verbalized understanding.

## 2017-01-22 ENCOUNTER — Other Ambulatory Visit: Payer: Self-pay | Admitting: Cardiology

## 2017-01-22 DIAGNOSIS — R0989 Other specified symptoms and signs involving the circulatory and respiratory systems: Secondary | ICD-10-CM

## 2017-01-29 ENCOUNTER — Ambulatory Visit (HOSPITAL_COMMUNITY)
Admission: RE | Admit: 2017-01-29 | Discharge: 2017-01-29 | Disposition: A | Discharge status: Home / Self Care | Payer: Medicare PPO | Source: Ambulatory Visit | Attending: Cardiology | Admitting: Cardiology

## 2017-01-29 DIAGNOSIS — R0989 Other specified symptoms and signs involving the circulatory and respiratory systems: Secondary | ICD-10-CM | POA: Insufficient documentation

## 2017-01-29 DIAGNOSIS — I10 Essential (primary) hypertension: Secondary | ICD-10-CM | POA: Insufficient documentation

## 2017-01-29 DIAGNOSIS — I739 Peripheral vascular disease, unspecified: Secondary | ICD-10-CM | POA: Diagnosis not present

## 2017-02-01 DIAGNOSIS — M4800 Spinal stenosis, site unspecified: Secondary | ICD-10-CM | POA: Diagnosis not present

## 2017-02-01 DIAGNOSIS — R269 Unspecified abnormalities of gait and mobility: Secondary | ICD-10-CM | POA: Diagnosis not present

## 2017-02-01 DIAGNOSIS — I2782 Chronic pulmonary embolism: Secondary | ICD-10-CM | POA: Diagnosis not present

## 2017-02-01 DIAGNOSIS — G4733 Obstructive sleep apnea (adult) (pediatric): Secondary | ICD-10-CM | POA: Diagnosis not present

## 2017-02-01 DIAGNOSIS — R0602 Shortness of breath: Secondary | ICD-10-CM | POA: Diagnosis not present

## 2017-02-01 DIAGNOSIS — G4734 Idiopathic sleep related nonobstructive alveolar hypoventilation: Secondary | ICD-10-CM | POA: Diagnosis not present

## 2017-02-02 ENCOUNTER — Telehealth: Payer: Self-pay | Admitting: Cardiology

## 2017-02-02 DIAGNOSIS — M5416 Radiculopathy, lumbar region: Secondary | ICD-10-CM | POA: Diagnosis not present

## 2017-02-02 NOTE — Telephone Encounter (Signed)
-----   Message from Quintella Reichertraci R Turner, MD sent at 01/30/2017  1:05 AM EDT ----- normal ABIs and dopplers

## 2017-02-02 NOTE — Telephone Encounter (Signed)
[name] is returning a call about test results . Please call

## 2017-02-02 NOTE — Telephone Encounter (Signed)
Per DPR form, left message with results on VM. Instructed patient to call back with questions or concerns.

## 2017-02-02 NOTE — Telephone Encounter (Signed)
-----   Message from Quintella Reichertraci R Turner, MD sent at 01/30/2017  4:32 PM EDT ----- Normal renal and abdominal dopplers

## 2017-02-13 NOTE — Addendum Note (Signed)
Addendum  created 02/13/17 1122 by Rosalva Neary, MD   Sign clinical note    

## 2017-02-13 NOTE — Anesthesia Postprocedure Evaluation (Signed)
Anesthesia Post Note  Patient: Ralph [name]  Procedure(s) Performed: Procedure(s) (LRB): LUMBAR 3-4 POSTERIOR LUMBAR FUSION WITH INSTRUMENTATION AND ALLOGRAFT (Bilateral)     Anesthesia Post Evaluation  Last Vitals:  Vitals:   11/14/16 0905 11/14/16 1420  BP: (!) 96/59 (!) 124/56  Pulse: 93 91  Resp: 18 20  Temp: 37.1 C 36.9 C    Last Pain:  Vitals:   11/14/16 1420  TempSrc: Oral  PainSc:                  Jiles GarterJACKSON,Elin Fenley EDWARD

## 2017-03-04 DIAGNOSIS — M4800 Spinal stenosis, site unspecified: Secondary | ICD-10-CM | POA: Diagnosis not present

## 2017-03-04 DIAGNOSIS — R269 Unspecified abnormalities of gait and mobility: Secondary | ICD-10-CM | POA: Diagnosis not present

## 2017-03-04 DIAGNOSIS — G4734 Idiopathic sleep related nonobstructive alveolar hypoventilation: Secondary | ICD-10-CM | POA: Diagnosis not present

## 2017-03-04 DIAGNOSIS — G4733 Obstructive sleep apnea (adult) (pediatric): Secondary | ICD-10-CM | POA: Diagnosis not present

## 2017-03-04 DIAGNOSIS — I2782 Chronic pulmonary embolism: Secondary | ICD-10-CM | POA: Diagnosis not present

## 2017-03-04 DIAGNOSIS — R0602 Shortness of breath: Secondary | ICD-10-CM | POA: Diagnosis not present

## 2017-03-16 ENCOUNTER — Encounter: Payer: Self-pay | Admitting: Adult Health

## 2017-03-18 ENCOUNTER — Encounter (INDEPENDENT_AMBULATORY_CARE_PROVIDER_SITE_OTHER): Payer: Self-pay

## 2017-03-18 ENCOUNTER — Ambulatory Visit (INDEPENDENT_AMBULATORY_CARE_PROVIDER_SITE_OTHER): Payer: Medicare PPO | Admitting: Adult Health

## 2017-03-18 ENCOUNTER — Encounter: Payer: Self-pay | Admitting: Adult Health

## 2017-03-18 VITALS — BP 125/77 | HR 67 | Ht 72.0 in | Wt 223.4 lb

## 2017-03-18 DIAGNOSIS — Z9989 Dependence on other enabling machines and devices: Secondary | ICD-10-CM | POA: Diagnosis not present

## 2017-03-18 DIAGNOSIS — G4733 Obstructive sleep apnea (adult) (pediatric): Secondary | ICD-10-CM | POA: Diagnosis not present

## 2017-03-18 NOTE — Progress Notes (Signed)
I agree with the assessment and plan as directed by NP .The patient is known to me .   Harlem Thresher, MD  

## 2017-03-18 NOTE — Patient Instructions (Signed)
Your Plan:  Continue using cpap nightly  If your symptoms worsen or you develop new symptoms please let us know.   Thank you for coming to see us at [facility]. I hope we have been able to provide you high quality care today.  You may receive a patient satisfaction survey over the next few weeks. We would appreciate your feedback and comments so that we may continue to improve ourselves and the health of our patients.  
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
## 2017-03-26 ENCOUNTER — Other Ambulatory Visit: Payer: Self-pay | Admitting: Neurology

## 2017-03-27 NOTE — Telephone Encounter (Signed)
Faxed printed/signed rx to [facility], [place]. Fax: [phone]. Received confirmation.

## 2017-04-01 ENCOUNTER — Other Ambulatory Visit: Payer: Self-pay | Admitting: Neurology

## 2017-04-03 ENCOUNTER — Other Ambulatory Visit: Payer: Self-pay | Admitting: Neurology

## 2017-04-04 DIAGNOSIS — G4733 Obstructive sleep apnea (adult) (pediatric): Secondary | ICD-10-CM | POA: Diagnosis not present

## 2017-04-04 DIAGNOSIS — G4734 Idiopathic sleep related nonobstructive alveolar hypoventilation: Secondary | ICD-10-CM | POA: Diagnosis not present

## 2017-04-04 DIAGNOSIS — R269 Unspecified abnormalities of gait and mobility: Secondary | ICD-10-CM | POA: Diagnosis not present

## 2017-04-04 DIAGNOSIS — R0602 Shortness of breath: Secondary | ICD-10-CM | POA: Diagnosis not present

## 2017-04-04 DIAGNOSIS — M4800 Spinal stenosis, site unspecified: Secondary | ICD-10-CM | POA: Diagnosis not present

## 2017-04-04 DIAGNOSIS — I2782 Chronic pulmonary embolism: Secondary | ICD-10-CM | POA: Diagnosis not present

## 2017-04-07 ENCOUNTER — Encounter: Payer: Self-pay | Admitting: Cardiology

## 2017-04-07 ENCOUNTER — Ambulatory Visit (INDEPENDENT_AMBULATORY_CARE_PROVIDER_SITE_OTHER): Payer: Medicare PPO | Admitting: Cardiology

## 2017-04-07 VITALS — BP 128/76 | HR 82 | Ht 72.0 in | Wt 218.0 lb

## 2017-04-07 DIAGNOSIS — Q211 Atrial septal defect: Secondary | ICD-10-CM | POA: Diagnosis not present

## 2017-04-07 DIAGNOSIS — I10 Essential (primary) hypertension: Secondary | ICD-10-CM | POA: Diagnosis not present

## 2017-04-07 DIAGNOSIS — Q2112 Patent foramen ovale: Secondary | ICD-10-CM

## 2017-04-07 DIAGNOSIS — R600 Localized edema: Secondary | ICD-10-CM
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
## 2017-04-07 NOTE — Patient Instructions (Signed)

## 2017-04-21 DIAGNOSIS — I10 Essential (primary) hypertension: Secondary | ICD-10-CM | POA: Diagnosis not present

## 2017-04-21 DIAGNOSIS — M62838 Other muscle spasm: Secondary | ICD-10-CM | POA: Diagnosis not present

## 2017-04-21 DIAGNOSIS — Z6831 Body mass index (BMI) 31.0-31.9, adult: Secondary | ICD-10-CM | POA: Diagnosis not present

## 2017-04-21 DIAGNOSIS — Z5181 Encounter for therapeutic drug level monitoring: Secondary | ICD-10-CM | POA: Diagnosis not present

## 2017-05-04 DIAGNOSIS — G4733 Obstructive sleep apnea (adult) (pediatric): Secondary | ICD-10-CM | POA: Diagnosis not present

## 2017-05-04 DIAGNOSIS — R269 Unspecified abnormalities of gait and mobility: Secondary | ICD-10-CM | POA: Diagnosis not present

## 2017-05-04 DIAGNOSIS — M4800 Spinal stenosis, site unspecified: Secondary | ICD-10-CM | POA: Diagnosis not present

## 2017-05-04 DIAGNOSIS — I2782 Chronic pulmonary embolism: Secondary | ICD-10-CM | POA: Diagnosis not present

## 2017-05-04 DIAGNOSIS — G4734 Idiopathic sleep related nonobstructive alveolar hypoventilation: Secondary | ICD-10-CM | POA: Diagnosis not present

## 2017-05-04 DIAGNOSIS — R0602 Shortness of breath: Secondary | ICD-10-CM | POA: Diagnosis not present

## 2017-05-18 ENCOUNTER — Ambulatory Visit (INDEPENDENT_AMBULATORY_CARE_PROVIDER_SITE_OTHER): Payer: Medicare PPO | Admitting: Neurology

## 2017-05-18 ENCOUNTER — Encounter: Payer: Self-pay | Admitting: Neurology

## 2017-05-18 VITALS — BP 131/81 | HR 70 | Ht 72.0 in | Wt 208.0 lb

## 2017-05-18 DIAGNOSIS — G8250 Quadriplegia, unspecified: Secondary | ICD-10-CM

## 2017-05-18 DIAGNOSIS — R269 Unspecified abnormalities of gait and mobility: Secondary | ICD-10-CM

## 2017-05-18 MED ORDER — BACLOFEN 40 MG/20ML IT SOLN: 40.0000 mg | Freq: Once | INTRATHECAL | 0 refills | Status: DC

## 2017-05-18 MED ORDER — BACLOFEN 40 MG/20ML IT SOLN
40.0000 mg | Freq: Once | INTRATHECAL | Status: AC
  Administered 2017-05-18: 40 mg via INTRATHECAL

## 2017-05-18 NOTE — Procedures (Signed)
     History: Shamarcus Hoheisel is a 51 year old gentleman with a spastic quadriparesis and a gait disorder. The patient comes back in for a baclofen pump refill today. He reports some generalized increase in spasticity over time.  Baclofen pump refill note  The baclofen pump site was cleaned with Betadine solution. A 21-gauge needle was inserted into the pump port site. Approximately 4 cc of residual baclofen was removed, the pump indicates a 2.6 mL residual. 20 cc of replacement baclofen was placed into the pump at 2000 mcg/cc concentration.  The pump was reprogrammed for the following settings: The pump settings were changed from a basal rate of 10.2 g per hour with a boost of 34.5 g from 9 PM until 3 AM. The basal rate was kept the same, the boost was increased to 37.4 g between 9 PM and 3 AM.  The alarm volume is set at 2.0. The next alarm date is 10/04/2017.  The patient tolerated the procedure well. There were no complications of the above procedure.  The NDC number is [phone] The baclofen expiration date is 05/04/2019. The baclofen lot number is [id number].

## 2017-05-18 NOTE — Progress Notes (Signed)
Please refer to baclofen refill note.

## 2017-05-21 ENCOUNTER — Ambulatory Visit (INDEPENDENT_AMBULATORY_CARE_PROVIDER_SITE_OTHER): Payer: Medicare PPO | Admitting: Emergency Medicine

## 2017-05-21 ENCOUNTER — Encounter: Payer: Self-pay | Admitting: Emergency Medicine

## 2017-05-21 DIAGNOSIS — G4733 Obstructive sleep apnea (adult) (pediatric): Secondary | ICD-10-CM

## 2017-05-21 DIAGNOSIS — Z9989 Dependence on other enabling machines and devices: Secondary | ICD-10-CM

## 2017-05-21 NOTE — Patient Instructions (Signed)
Please continue CPAP and supplemental oxygen as you have been using it every night. You have great compliance with your device We will defer to neurology to renew your oxygen and her CPAP annually since you to our following with them regularly. Follow with Dr Delton CoombesByrum if needed for any issues.

## 2017-05-21 NOTE — Assessment & Plan Note (Signed)
Obstructive and central sleep apnea requiring nocturnal oxygen. He's been evaluated with a bubble study, with a VQ scan. I do not believe he has another etiology for hypoxemia. He just needs oxygen at 3 L/m bled into his auto titration CPAP. With him today that he should be able to get his oxygen renewed when he follows with Neurology since we are on a stable dose. That would save him from needing to follow in our office, save him the co-pay, etc.
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
## 2017-06-04 DIAGNOSIS — R0602 Shortness of breath: Secondary | ICD-10-CM | POA: Diagnosis not present

## 2017-06-04 DIAGNOSIS — G4734 Idiopathic sleep related nonobstructive alveolar hypoventilation: Secondary | ICD-10-CM | POA: Diagnosis not present

## 2017-06-04 DIAGNOSIS — R269 Unspecified abnormalities of gait and mobility: Secondary | ICD-10-CM | POA: Diagnosis not present

## 2017-06-04 DIAGNOSIS — G4733 Obstructive sleep apnea (adult) (pediatric): Secondary | ICD-10-CM | POA: Diagnosis not present

## 2017-06-04 DIAGNOSIS — I2782 Chronic pulmonary embolism: Secondary | ICD-10-CM | POA: Diagnosis not present

## 2017-06-04 DIAGNOSIS — N3001 Acute cystitis with hematuria: Secondary | ICD-10-CM | POA: Diagnosis not present

## 2017-06-04 DIAGNOSIS — N3091 Cystitis, unspecified with hematuria: Secondary | ICD-10-CM | POA: Diagnosis not present

## 2017-06-04 DIAGNOSIS — M4800 Spinal stenosis, site unspecified: Secondary | ICD-10-CM | POA: Diagnosis not present

## 2017-06-07 IMAGING — US US ABDOMEN LIMITED
1 series · 14 of 25 positions shown · non-contrast
Comparison: None.

CLINICAL DATA: Abdominal pain.

EXAM:
US ABDOMEN LIMITED - RIGHT UPPER QUADRANT

[Series 1: us abdomen limited · 0.23mm/px · 14 of 52 slices shown]
[im 1/52]
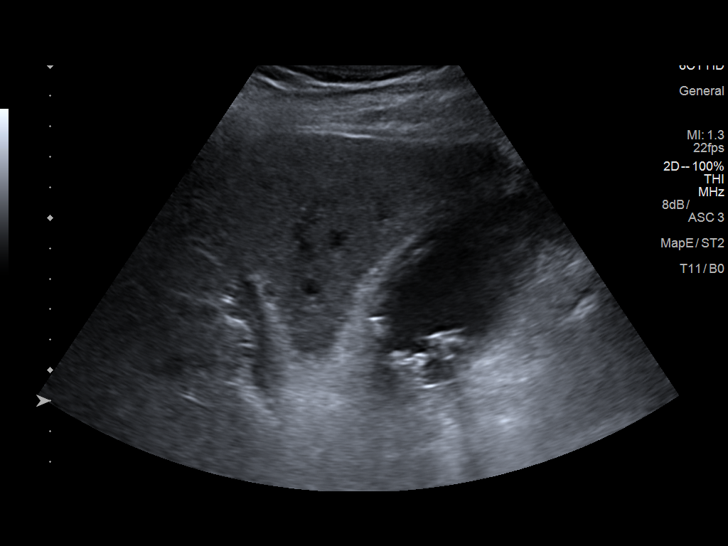
[im 5/52]
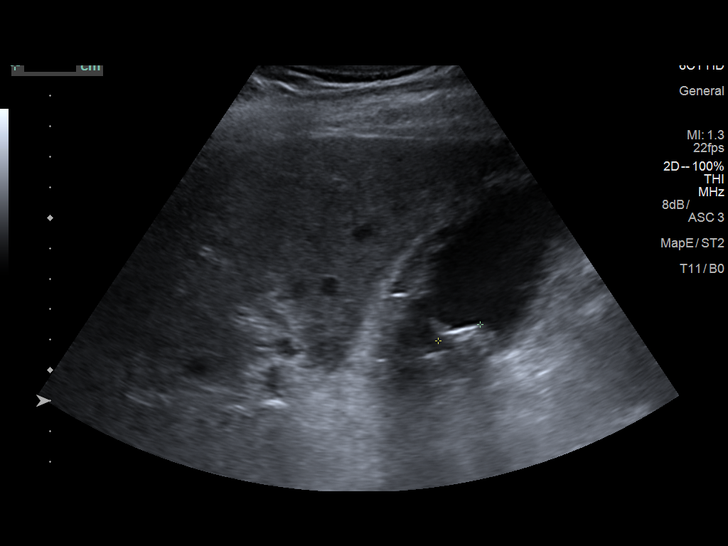
[im 9/52]
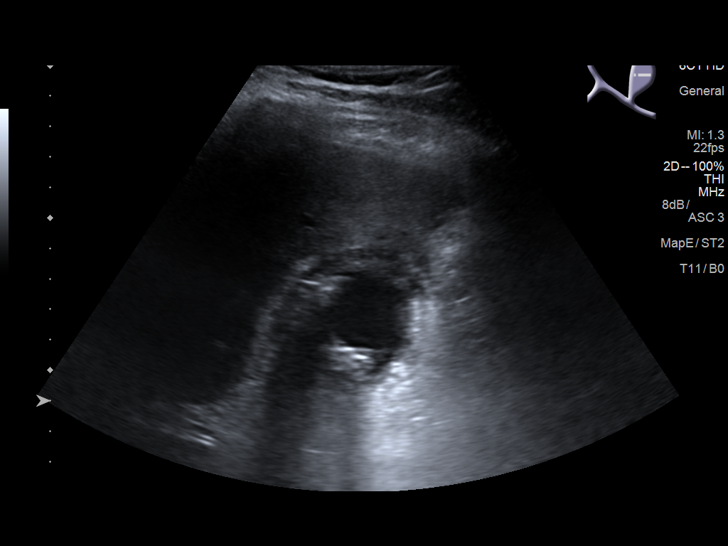
[im 13/52]
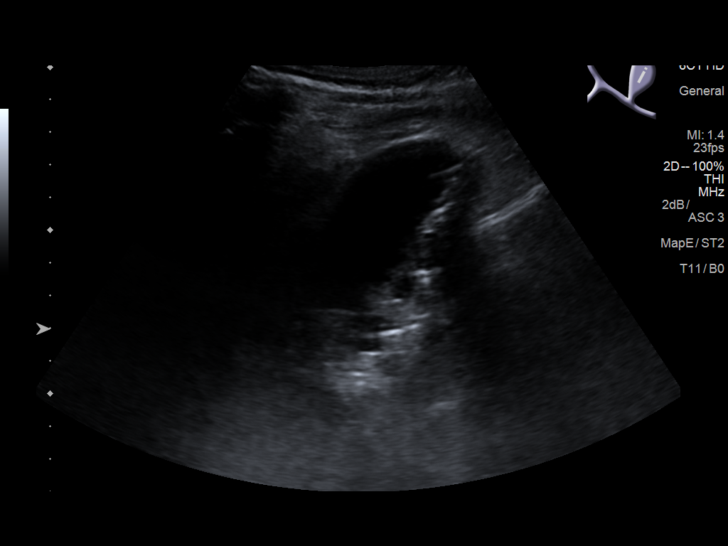
[im 18/52]
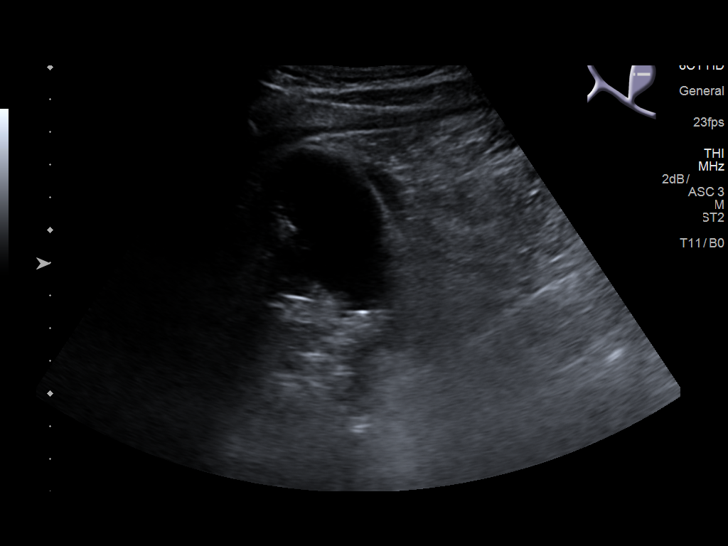
[im 20/52]
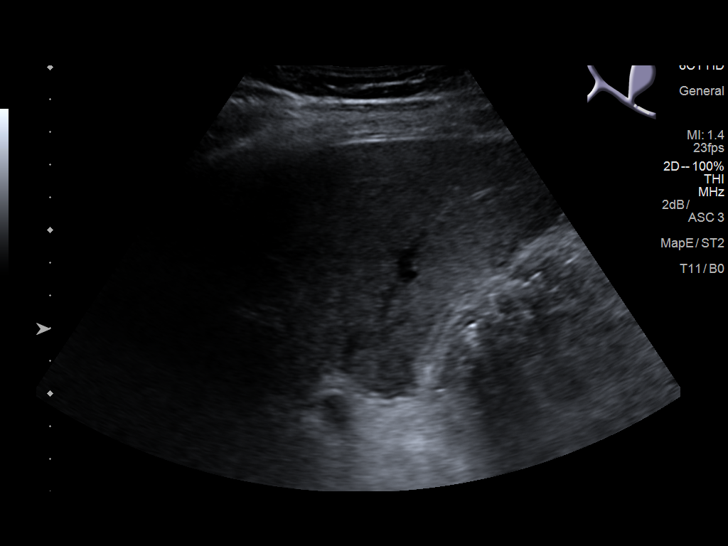
[im 24/52]
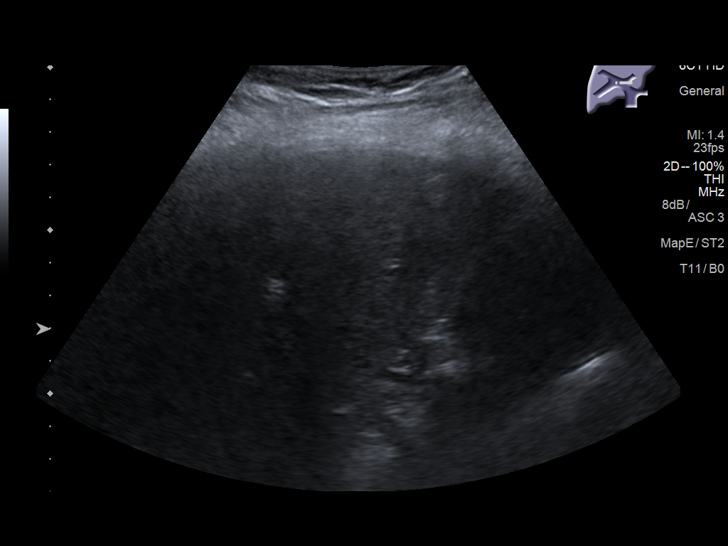
[im 28/52]
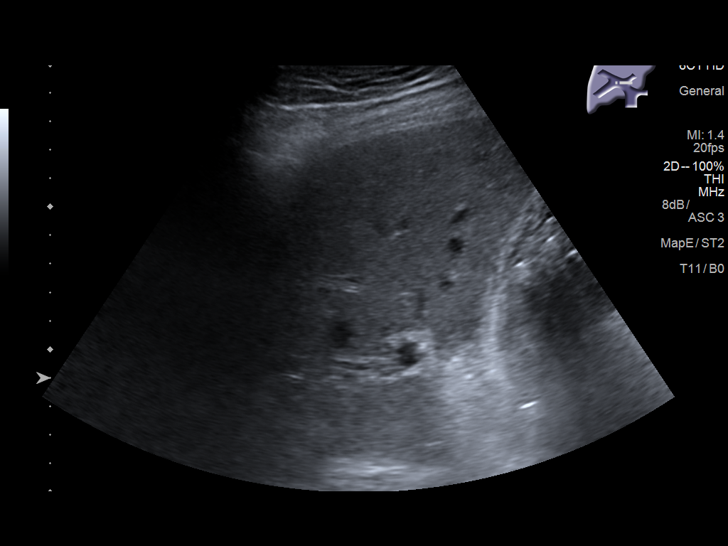
[im 32/52]
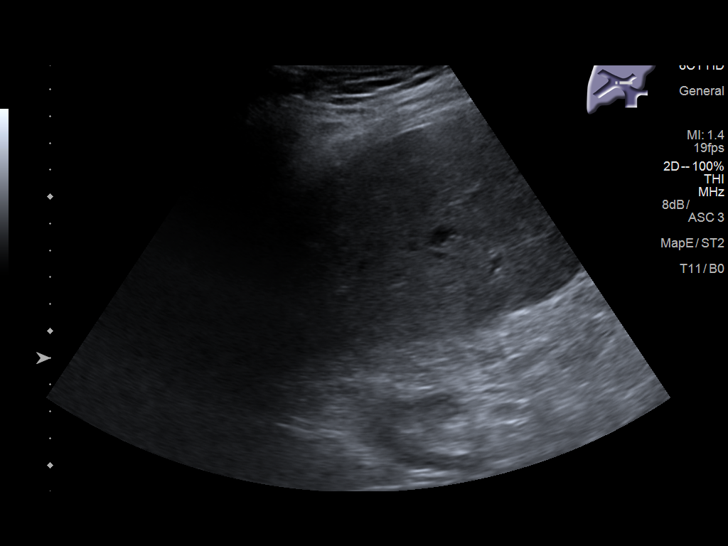
[im 35/52]
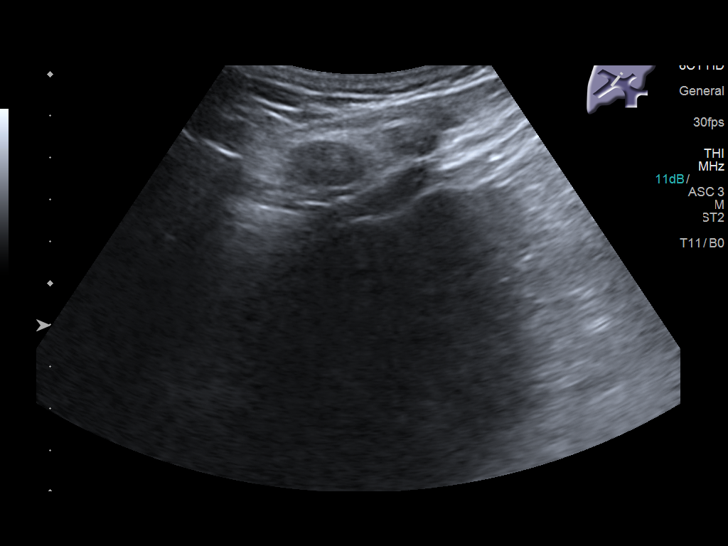
[im 39/52]
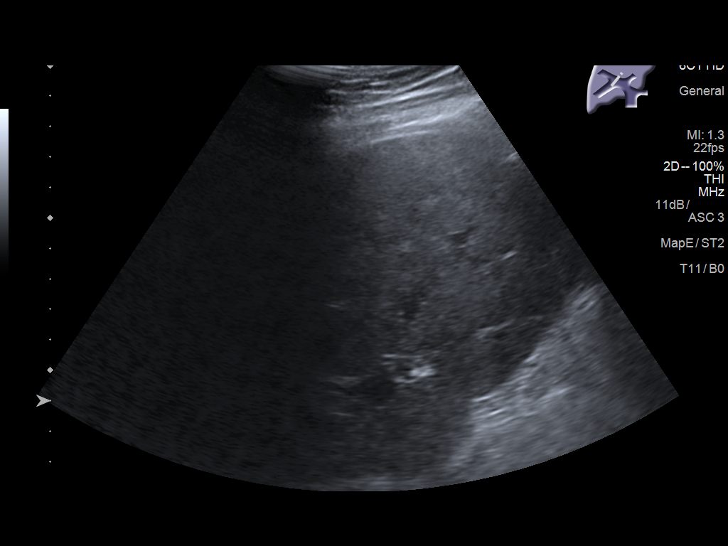
[im 43/52]
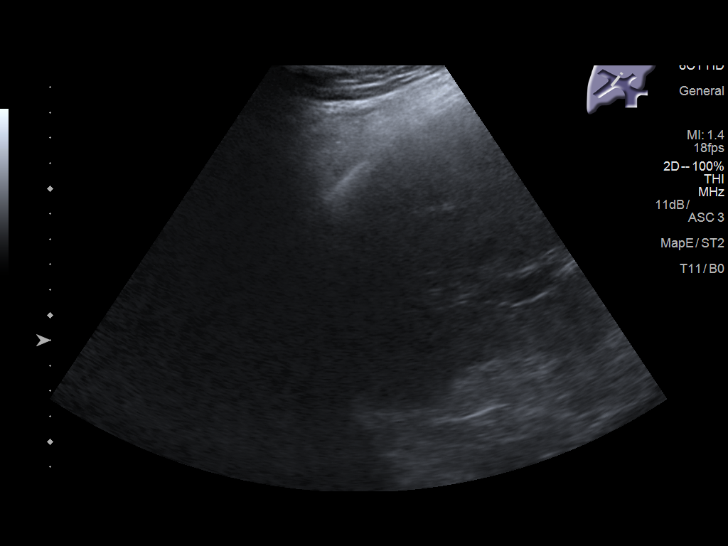
[im 47/52]
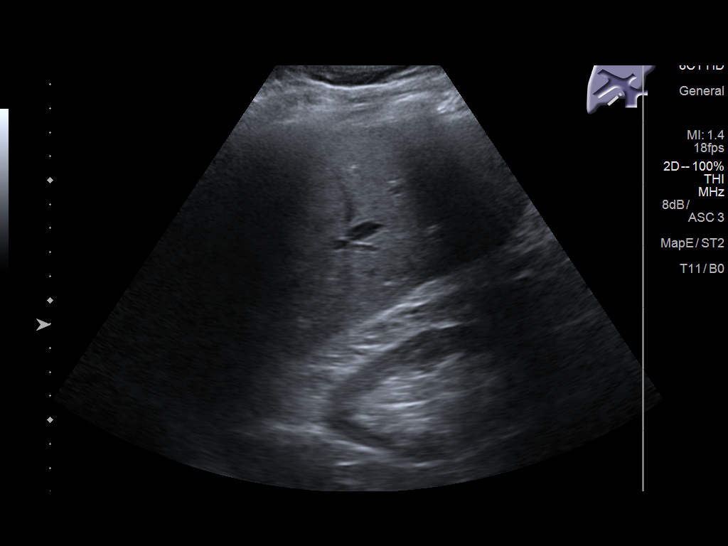
[im 52/52]
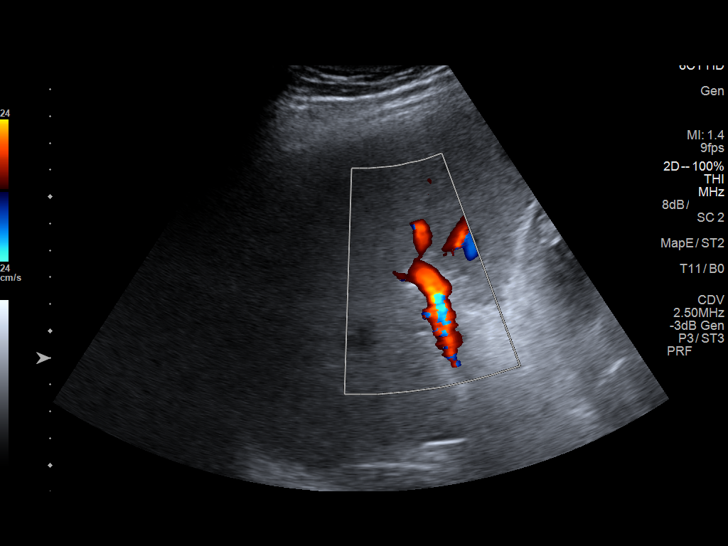

[14 of 25 positions shown; findings below may reference images not displayed]

FINDINGS: Gallbladder:

The gallbladder is normally distended. Mobile sludge and gallbladder
stones are seen within the the dependent portion of the gallbladder.
There is prominent gallbladder wall thickening with transverse
measurement of measures up to 6 mm. Pericholecystic fluid is
present. The sonographer reports positive sonographic Murphy's sign.

Common bile duct:

Diameter: 2.4 mm

Liver:

No focal lesion identified. Within normal limits in parenchymal
echogenicity.
IMPRESSION: Cholelithiasis with evidence of acute cholecystitis.

Normal caliber of the common bile duct.

These results were called by telephone at the time of interpretation
on 07/27/2016 at [DATE] to Dr. SAMEERKHAN VILLARUEL , who verbally
acknowledged these results.

## 2017-06-22 IMAGING — RF DG C-ARM 61-120 MIN
1 series · 2 of 2 positions shown · non-contrast
Comparison: 11/12/2016 lumbar spine intraoperative radiographs

CLINICAL DATA: Lumbar fusion surgery.

EXAM:
LUMBAR SPINE - 2-3 VIEW; DG C-ARM 61-120 MIN

[Series 1: run · 2 of 2 slices shown]
[im 1/2]
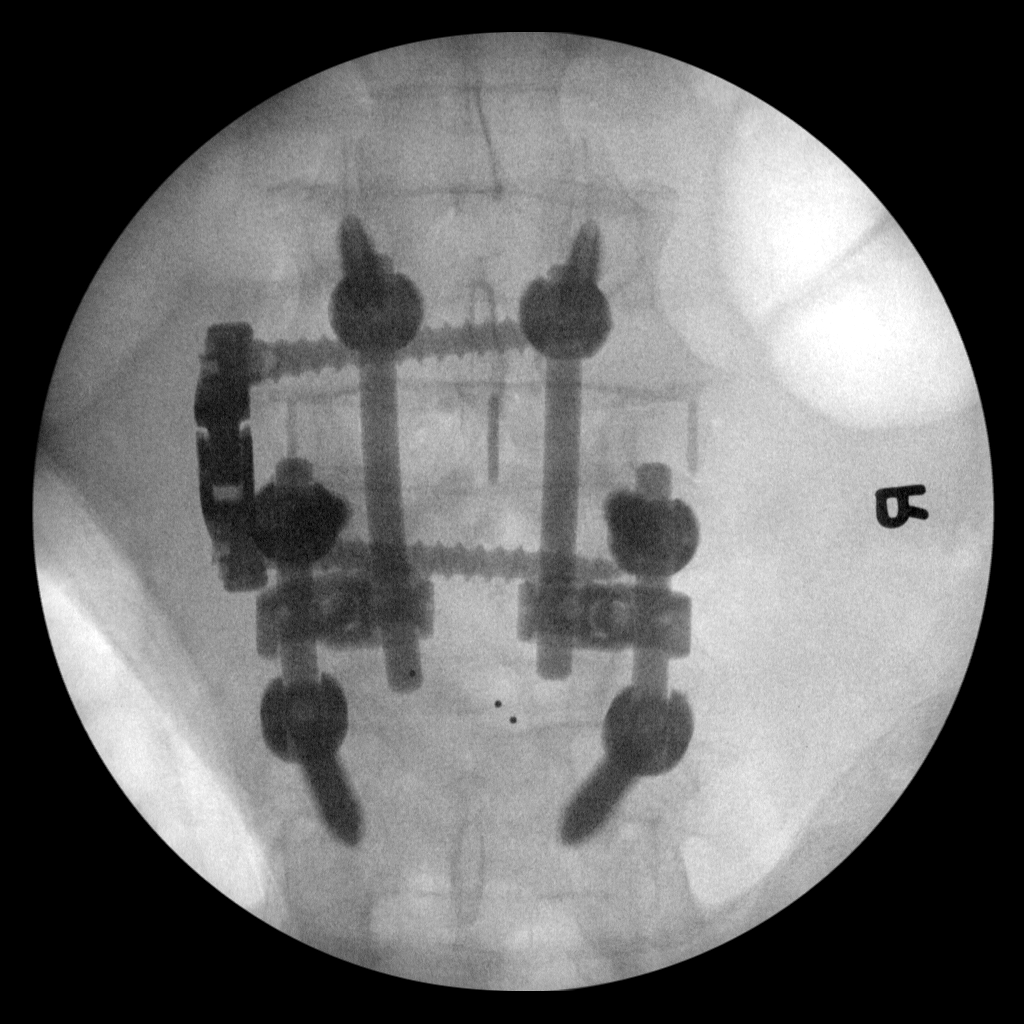
[im 2/2]
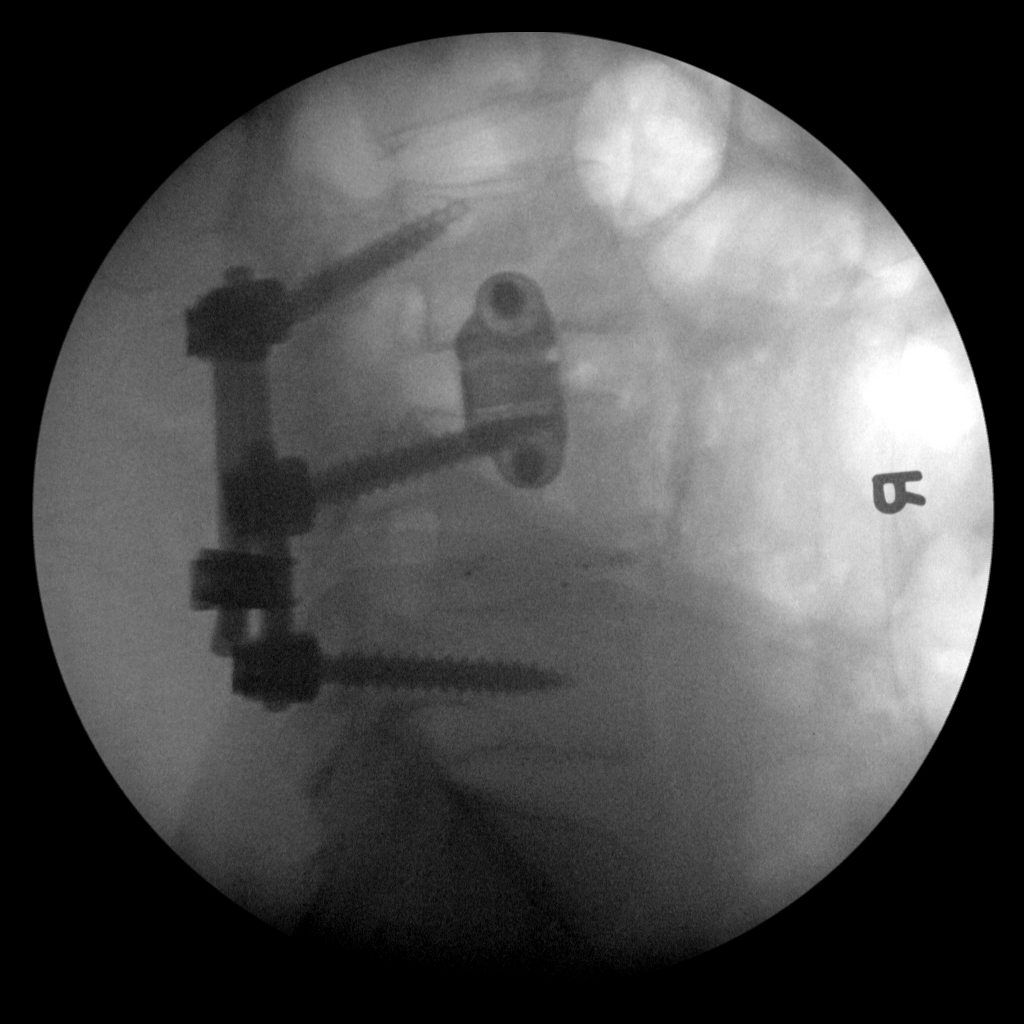

[2 of 2 positions shown; findings below may reference images not displayed]

FINDINGS: Fluoroscopy time 0 minutes 28 seconds. Two spot fluoroscopic
nondiagnostic intraoperative lumbar spine radiographs demonstrate no
bilateral posterior spinal fusion hardware at L3-4, with preexisting
bilateral posterior spinal fusion hardware at L4-5 and left lateral
L3-4.
IMPRESSION: Intraoperative fluoroscopic guidance for posterior lumbar spine
fusion.

## 2017-07-04 DIAGNOSIS — I2782 Chronic pulmonary embolism: Secondary | ICD-10-CM | POA: Diagnosis not present

## 2017-07-04 DIAGNOSIS — R269 Unspecified abnormalities of gait and mobility: Secondary | ICD-10-CM | POA: Diagnosis not present

## 2017-07-04 DIAGNOSIS — M4800 Spinal stenosis, site unspecified: Secondary | ICD-10-CM | POA: Diagnosis not present

## 2017-07-04 DIAGNOSIS — R0602 Shortness of breath: Secondary | ICD-10-CM | POA: Diagnosis not present

## 2017-07-04 DIAGNOSIS — G4733 Obstructive sleep apnea (adult) (pediatric): Secondary | ICD-10-CM | POA: Diagnosis not present

## 2017-07-04 DIAGNOSIS — G4734 Idiopathic sleep related nonobstructive alveolar hypoventilation: Secondary | ICD-10-CM | POA: Diagnosis not present

## 2017-08-04 DIAGNOSIS — G4733 Obstructive sleep apnea (adult) (pediatric): Secondary | ICD-10-CM | POA: Diagnosis not present

## 2017-08-04 DIAGNOSIS — R0602 Shortness of breath: Secondary | ICD-10-CM | POA: Diagnosis not present

## 2017-08-04 DIAGNOSIS — M4800 Spinal stenosis, site unspecified: Secondary | ICD-10-CM | POA: Diagnosis not present

## 2017-08-04 DIAGNOSIS — I2782 Chronic pulmonary embolism: Secondary | ICD-10-CM | POA: Diagnosis not present

## 2017-08-04 DIAGNOSIS — R269 Unspecified abnormalities of gait and mobility: Secondary | ICD-10-CM | POA: Diagnosis not present

## 2017-08-04 DIAGNOSIS — G4734 Idiopathic sleep related nonobstructive alveolar hypoventilation: Secondary | ICD-10-CM | POA: Diagnosis not present

## 2017-08-28 ENCOUNTER — Other Ambulatory Visit: Payer: Self-pay | Admitting: Neurology

## 2017-08-28 NOTE — Telephone Encounter (Signed)
Faxed printed/signed rx eszopiclone to [facility] [place] at [phone]. Received fax confirmation.

## 2017-09-04 DIAGNOSIS — G4734 Idiopathic sleep related nonobstructive alveolar hypoventilation: Secondary | ICD-10-CM | POA: Diagnosis not present

## 2017-09-04 DIAGNOSIS — I2782 Chronic pulmonary embolism: Secondary | ICD-10-CM | POA: Diagnosis not present

## 2017-09-04 DIAGNOSIS — R269 Unspecified abnormalities of gait and mobility: Secondary | ICD-10-CM | POA: Diagnosis not present

## 2017-09-04 DIAGNOSIS — G4733 Obstructive sleep apnea (adult) (pediatric): Secondary | ICD-10-CM | POA: Diagnosis not present

## 2017-09-04 DIAGNOSIS — M4800 Spinal stenosis, site unspecified: Secondary | ICD-10-CM | POA: Diagnosis not present

## 2017-09-04 DIAGNOSIS — R0602 Shortness of breath: Secondary | ICD-10-CM | POA: Diagnosis not present

## 2017-09-07 ENCOUNTER — Telehealth: Payer: Self-pay | Admitting: *Deleted

## 2017-09-07 NOTE — Telephone Encounter (Signed)
Initiated PA eszopiclone 3mg  tablet in covermymeds. Key: [id number]. In process of completing.

## 2017-09-08 NOTE — Telephone Encounter (Signed)
Submitted PA on covermymeds. KeyTamala Ser: [id number] - PA Case ID: [id number]. [facility].

## 2017-09-08 NOTE — Telephone Encounter (Signed)
[facility] approved for non-formulary effective until 08/03/2018.   Member ID: [id number]

## 2017-09-30 ENCOUNTER — Ambulatory Visit: Payer: Medicare PPO | Admitting: Neurology

## 2017-09-30 ENCOUNTER — Encounter: Payer: Self-pay | Admitting: Neurology

## 2017-09-30 ENCOUNTER — Encounter (INDEPENDENT_AMBULATORY_CARE_PROVIDER_SITE_OTHER): Payer: Self-pay

## 2017-09-30 VITALS — BP 133/78 | HR 105 | Wt 223.5 lb

## 2017-09-30 DIAGNOSIS — G8250 Quadriplegia, unspecified: Secondary | ICD-10-CM

## 2017-09-30 MED ORDER — BACLOFEN 40 MG/20ML IT SOLN
40.0000 mg | Freq: Once | INTRATHECAL | Status: AC
  Administered 2017-09-30: 40 mg via INTRATHECAL

## 2017-09-30 NOTE — Progress Notes (Signed)
Please refer to baclofen pump procedure note. 

## 2017-09-30 NOTE — Procedures (Signed)
      History: Lenis NoonDarryl Suits is a 52 year old gentleman with a spastic quadriparesis and a gait disorder. The patient comes back in for a baclofen pump refill today. He reports no change in spasticity level.  Baclofen pump refill note  The baclofen pump site was cleaned with Betadine solution. A 21-gauge needle was inserted into the pump port site. Approximately 4 cc of residual baclofen was removed, the pump indicates a 2.6 mL residual. 20 cc of replacement baclofen was placed into the pump at 2000 mcg/cc concentration.  The pump was programmed for the following settings: The pump settings were unchanged. The basal rate was kept the same, the boost was kept at 37.4 g between 9 PM and 3 AM.  The alarm volume is set at 2.0. The next alarm date is 02/16/2018.  The patient tolerated the procedure well. There were no complications of the above procedure.  The NDC number is [phone] The baclofen expiration date is 08/04/2019. The baclofen lot number is [id number].

## 2017-10-02 DIAGNOSIS — M4800 Spinal stenosis, site unspecified: Secondary | ICD-10-CM | POA: Diagnosis not present

## 2017-10-02 DIAGNOSIS — G4733 Obstructive sleep apnea (adult) (pediatric): Secondary | ICD-10-CM | POA: Diagnosis not present

## 2017-10-02 DIAGNOSIS — R0602 Shortness of breath: Secondary | ICD-10-CM | POA: Diagnosis not present

## 2017-10-02 DIAGNOSIS — G4734 Idiopathic sleep related nonobstructive alveolar hypoventilation: Secondary | ICD-10-CM | POA: Diagnosis not present

## 2017-10-02 DIAGNOSIS — I2782 Chronic pulmonary embolism: Secondary | ICD-10-CM | POA: Diagnosis not present

## 2017-10-02 DIAGNOSIS — R269 Unspecified abnormalities of gait and mobility: Secondary | ICD-10-CM | POA: Diagnosis not present

## 2017-10-05 ENCOUNTER — Other Ambulatory Visit: Payer: Self-pay | Admitting: Neurology

## 2017-10-07 DIAGNOSIS — M4800 Spinal stenosis, site unspecified: Secondary | ICD-10-CM | POA: Diagnosis not present

## 2017-10-07 DIAGNOSIS — G4733 Obstructive sleep apnea (adult) (pediatric): Secondary | ICD-10-CM | POA: Diagnosis not present

## 2017-10-07 DIAGNOSIS — R269 Unspecified abnormalities of gait and mobility: Secondary | ICD-10-CM | POA: Diagnosis not present

## 2017-10-29 DIAGNOSIS — D225 Melanocytic nevi of trunk: Secondary | ICD-10-CM | POA: Diagnosis not present

## 2017-10-29 DIAGNOSIS — D1801 Hemangioma of skin and subcutaneous tissue: Secondary | ICD-10-CM | POA: Diagnosis not present

## 2017-10-29 DIAGNOSIS — D485 Neoplasm of uncertain behavior of skin: Secondary | ICD-10-CM | POA: Diagnosis not present

## 2017-11-02 ENCOUNTER — Other Ambulatory Visit: Payer: Self-pay | Admitting: Neurology

## 2017-11-02 DIAGNOSIS — I2782 Chronic pulmonary embolism: Secondary | ICD-10-CM | POA: Diagnosis not present

## 2017-11-02 DIAGNOSIS — M4800 Spinal stenosis, site unspecified: Secondary | ICD-10-CM | POA: Diagnosis not present

## 2017-11-02 DIAGNOSIS — R269 Unspecified abnormalities of gait and mobility: Secondary | ICD-10-CM | POA: Diagnosis not present

## 2017-11-02 DIAGNOSIS — R0602 Shortness of breath: Secondary | ICD-10-CM | POA: Diagnosis not present

## 2017-11-02 DIAGNOSIS — G4734 Idiopathic sleep related nonobstructive alveolar hypoventilation: Secondary | ICD-10-CM | POA: Diagnosis not present

## 2017-11-02 DIAGNOSIS — G4733 Obstructive sleep apnea (adult) (pediatric): Secondary | ICD-10-CM | POA: Diagnosis not present

## 2017-11-02 NOTE — Telephone Encounter (Signed)
Faxed printed/signed rx eszopiclone 3mg  [facility] at [phone]. Received fax confirmation.

## 2017-12-01 DIAGNOSIS — M62838 Other muscle spasm: Secondary | ICD-10-CM | POA: Diagnosis not present

## 2017-12-01 DIAGNOSIS — E785 Hyperlipidemia, unspecified: Secondary | ICD-10-CM | POA: Diagnosis not present

## 2017-12-01 DIAGNOSIS — Z125 Encounter for screening for malignant neoplasm of prostate: Secondary | ICD-10-CM | POA: Diagnosis not present

## 2017-12-01 DIAGNOSIS — N529 Male erectile dysfunction, unspecified: Secondary | ICD-10-CM | POA: Diagnosis not present

## 2017-12-01 DIAGNOSIS — I10 Essential (primary) hypertension: Secondary | ICD-10-CM | POA: Diagnosis not present

## 2017-12-01 DIAGNOSIS — Z1339 Encounter for screening examination for other mental health and behavioral disorders: Secondary | ICD-10-CM | POA: Diagnosis not present

## 2017-12-01 DIAGNOSIS — Z79899 Other long term (current) drug therapy: Secondary | ICD-10-CM | POA: Diagnosis not present

## 2017-12-01 DIAGNOSIS — Z6830 Body mass index (BMI) 30.0-30.9, adult: Secondary | ICD-10-CM | POA: Diagnosis not present

## 2017-12-01 DIAGNOSIS — K219 Gastro-esophageal reflux disease without esophagitis: Secondary | ICD-10-CM | POA: Diagnosis not present

## 2017-12-02 DIAGNOSIS — M4800 Spinal stenosis, site unspecified: Secondary | ICD-10-CM | POA: Diagnosis not present

## 2017-12-02 DIAGNOSIS — R0602 Shortness of breath: Secondary | ICD-10-CM | POA: Diagnosis not present

## 2017-12-02 DIAGNOSIS — I2782 Chronic pulmonary embolism: Secondary | ICD-10-CM | POA: Diagnosis not present

## 2017-12-02 DIAGNOSIS — R269 Unspecified abnormalities of gait and mobility: Secondary | ICD-10-CM | POA: Diagnosis not present

## 2017-12-02 DIAGNOSIS — G4733 Obstructive sleep apnea (adult) (pediatric): Secondary | ICD-10-CM | POA: Diagnosis not present

## 2017-12-02 DIAGNOSIS — G4734 Idiopathic sleep related nonobstructive alveolar hypoventilation: Secondary | ICD-10-CM | POA: Diagnosis not present

## 2018-01-02 DIAGNOSIS — M4800 Spinal stenosis, site unspecified: Secondary | ICD-10-CM | POA: Diagnosis not present

## 2018-01-02 DIAGNOSIS — G4734 Idiopathic sleep related nonobstructive alveolar hypoventilation: Secondary | ICD-10-CM | POA: Diagnosis not present

## 2018-01-02 DIAGNOSIS — G4733 Obstructive sleep apnea (adult) (pediatric): Secondary | ICD-10-CM | POA: Diagnosis not present

## 2018-01-02 DIAGNOSIS — R269 Unspecified abnormalities of gait and mobility: Secondary | ICD-10-CM | POA: Diagnosis not present

## 2018-01-02 DIAGNOSIS — I2782 Chronic pulmonary embolism: Secondary | ICD-10-CM | POA: Diagnosis not present

## 2018-01-02 DIAGNOSIS — R0602 Shortness of breath: Secondary | ICD-10-CM | POA: Diagnosis not present

## 2018-02-01 DIAGNOSIS — G4734 Idiopathic sleep related nonobstructive alveolar hypoventilation: Secondary | ICD-10-CM | POA: Diagnosis not present

## 2018-02-01 DIAGNOSIS — I2782 Chronic pulmonary embolism: Secondary | ICD-10-CM | POA: Diagnosis not present

## 2018-02-01 DIAGNOSIS — R0602 Shortness of breath: Secondary | ICD-10-CM | POA: Diagnosis not present

## 2018-02-01 DIAGNOSIS — M4800 Spinal stenosis, site unspecified: Secondary | ICD-10-CM | POA: Diagnosis not present

## 2018-02-01 DIAGNOSIS — R269 Unspecified abnormalities of gait and mobility: Secondary | ICD-10-CM | POA: Diagnosis not present

## 2018-02-01 DIAGNOSIS — G4733 Obstructive sleep apnea (adult) (pediatric): Secondary | ICD-10-CM | POA: Diagnosis not present

## 2018-02-03 ENCOUNTER — Encounter: Payer: Self-pay | Admitting: Neurology

## 2018-02-03 ENCOUNTER — Ambulatory Visit: Payer: Medicare PPO | Admitting: Neurology

## 2018-02-03 VITALS — BP 131/85 | HR 84 | Ht 72.0 in | Wt 222.0 lb

## 2018-02-03 DIAGNOSIS — G8250 Quadriplegia, unspecified: Secondary | ICD-10-CM | POA: Diagnosis not present

## 2018-02-03 MED ORDER — BACLOFEN 40 MG/20ML IT SOLN
40.0000 mg | Freq: Once | INTRATHECAL | Status: AC
  Administered 2018-02-03: 40 mg via INTRATHECAL

## 2018-02-03 NOTE — Progress Notes (Signed)
Please refer to baclofen procedure note. 

## 2018-02-03 NOTE — Procedures (Signed)
      History:  Ralph [name] is a 52 year old gentleman with a history of cervical myelopathy with a spastic quadriparesis.  The patient comes in for a baclofen pump refill today.  He has noted some slight increase in spasticity in the evening hours, he wishes to have some increase in the baclofen right.  Baclofen pump refill note  The baclofen pump site was cleaned with Betadine solution. A 21-gauge needle was inserted into the pump port site. Approximately 5.0 cc of residual baclofen was removed, the baclofen pump indicates a 3.8 cc residual. 20 cc of replacement baclofen was placed into the pump at 2000 mcg/cc concentration.  The pump was reprogrammed for the following settings: The patient was converted to a simple continuous rate, going from 258 mcg/day to a 270.1 mcg/day regimen, this represents a 5% increase.  The alarm volume is set at 2.0 cc. The next alarm date is June 16, 2018.  The patient tolerated the procedure well. There were no complications of the above procedure.   The NDC number is [phone] The baclofen expiration date is October 2021. The baclofen lot number is [id number].

## 2018-03-04 DIAGNOSIS — I2782 Chronic pulmonary embolism: Secondary | ICD-10-CM | POA: Diagnosis not present

## 2018-03-04 DIAGNOSIS — R0602 Shortness of breath: Secondary | ICD-10-CM | POA: Diagnosis not present

## 2018-03-04 DIAGNOSIS — G4733 Obstructive sleep apnea (adult) (pediatric): Secondary | ICD-10-CM | POA: Diagnosis not present

## 2018-03-04 DIAGNOSIS — R269 Unspecified abnormalities of gait and mobility: Secondary | ICD-10-CM | POA: Diagnosis not present

## 2018-03-04 DIAGNOSIS — M4800 Spinal stenosis, site unspecified: Secondary | ICD-10-CM | POA: Diagnosis not present

## 2018-03-04 DIAGNOSIS — G4734 Idiopathic sleep related nonobstructive alveolar hypoventilation: Secondary | ICD-10-CM | POA: Diagnosis not present

## 2018-03-20 DIAGNOSIS — L02414 Cutaneous abscess of left upper limb: Secondary | ICD-10-CM | POA: Diagnosis not present

## 2018-03-22 ENCOUNTER — Ambulatory Visit (HOSPITAL_COMMUNITY)
Admission: EM | Admit: 2018-03-22 | Discharge: 2018-03-22 | Disposition: A | Discharge status: Home / Self Care | Payer: Medicare PPO | Attending: Family Medicine | Admitting: Family Medicine

## 2018-03-22 ENCOUNTER — Ambulatory Visit (INDEPENDENT_AMBULATORY_CARE_PROVIDER_SITE_OTHER): Payer: Medicare PPO

## 2018-03-22 ENCOUNTER — Ambulatory Visit: Payer: Medicare PPO | Admitting: Neurology

## 2018-03-22 ENCOUNTER — Encounter (HOSPITAL_COMMUNITY): Payer: Self-pay | Admitting: Emergency Medicine

## 2018-03-22 ENCOUNTER — Encounter: Payer: Self-pay | Admitting: Neurology

## 2018-03-22 DIAGNOSIS — M940 Chondrocostal junction syndrome [Tietze]: Secondary | ICD-10-CM

## 2018-03-22 DIAGNOSIS — R0781 Pleurodynia: Secondary | ICD-10-CM | POA: Diagnosis not present

## 2018-03-22 NOTE — ED Triage Notes (Signed)
Pt here for left sided rib pain x 4 days; pt denies obvious injury; pt with hx of spinal cord injury
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
## 2018-03-25 ENCOUNTER — Ambulatory Visit: Payer: Medicare PPO | Admitting: Adult Health

## 2018-03-25 ENCOUNTER — Encounter: Payer: Self-pay | Admitting: Adult Health

## 2018-03-25 ENCOUNTER — Telehealth: Payer: Self-pay | Admitting: Adult Health

## 2018-03-25 VITALS — BP 148/98 | HR 68 | Ht 72.0 in | Wt 215.2 lb

## 2018-03-25 DIAGNOSIS — Z9989 Dependence on other enabling machines and devices: Secondary | ICD-10-CM | POA: Diagnosis not present

## 2018-03-25 DIAGNOSIS — G4733 Obstructive sleep apnea (adult) (pediatric): Secondary | ICD-10-CM

## 2018-03-25 NOTE — Telephone Encounter (Signed)
LVM to schedule patient a 1 yr f/u w/ MM for CPAP.

## 2018-03-25 NOTE — Patient Instructions (Signed)
Your Plan:  Continue using CPAP nightly and >4 hours each night If your symptoms worsen or you develop new symptoms please let us know.   Thank you for coming to see us at [facility]. I hope we have been able to provide you high quality care today.  You may receive a patient satisfaction survey over the next few weeks. We would appreciate your feedback and comments so that we may continue to improve ourselves and the health of our patients.  
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
## 2018-04-04 DIAGNOSIS — R0602 Shortness of breath: Secondary | ICD-10-CM | POA: Diagnosis not present

## 2018-04-04 DIAGNOSIS — G4733 Obstructive sleep apnea (adult) (pediatric): Secondary | ICD-10-CM | POA: Diagnosis not present

## 2018-04-04 DIAGNOSIS — G4734 Idiopathic sleep related nonobstructive alveolar hypoventilation: Secondary | ICD-10-CM | POA: Diagnosis not present

## 2018-04-04 DIAGNOSIS — R269 Unspecified abnormalities of gait and mobility: Secondary | ICD-10-CM | POA: Diagnosis not present

## 2018-04-04 DIAGNOSIS — I2782 Chronic pulmonary embolism: Secondary | ICD-10-CM | POA: Diagnosis not present

## 2018-04-04 DIAGNOSIS — M4800 Spinal stenosis, site unspecified: Secondary | ICD-10-CM | POA: Diagnosis not present

## 2018-04-06 ENCOUNTER — Telehealth: Payer: Self-pay | Admitting: Adult Health

## 2018-04-06 DIAGNOSIS — Z9989 Dependence on other enabling machines and devices: Principal | ICD-10-CM

## 2018-04-06 DIAGNOSIS — G4733 Obstructive sleep apnea (adult) (pediatric): Secondary | ICD-10-CM

## 2018-04-06 NOTE — Telephone Encounter (Signed)
Requested a call back from the patient to further discuss. MB RN 

## 2018-04-06 NOTE — Telephone Encounter (Signed)
Patient saw Aundra Millet on 03-25-18 and forgot to discuss a problem. For the last 2 weeks he has had problems with his CPAP. He wears his CPAP every night and the right side of his neck at the base of his skull is very sore. The left side is fine. Please call and discuss.

## 2018-04-07 NOTE — Telephone Encounter (Signed)
Requested a call back from the patient to further discuss. MB RN 

## 2018-04-07 NOTE — Addendum Note (Signed)
Addended by: Ann Maki T on: 04/07/2018 11:48 AM   Modules accepted: Orders

## 2018-04-07 NOTE — Telephone Encounter (Signed)
Patient called back. He stated for the last 2 weeks the straps behind his right ear have been bothering him and he believes this is causing the soreness on the back right side of his head. Patient was advised we could send a [facility] order to [facility] about refitting him for a proper mask/straps. Patient was agreeable to this order being placed. Order placed within epic and [facility] notified.  Patient advised [facility] will be in touch about refitting for his mask/straps. He verbalized understanding and had no further concerns.  MB RN

## 2018-04-08 ENCOUNTER — Telehealth: Payer: Self-pay | Admitting: Adult Health

## 2018-04-08 NOTE — Telephone Encounter (Signed)
Pt has called wanting Dr Anne Hahn to be aware that a company(pt does not recall the name of the company) will be sending paperwork on behalf of pt for a scooter.  Pt would like a call from RN once this paperwork has been received.  No call back requested until then.

## 2018-04-09 NOTE — Telephone Encounter (Signed)
Noted  

## 2018-04-16 DIAGNOSIS — G8250 Quadriplegia, unspecified: Secondary | ICD-10-CM | POA: Diagnosis not present

## 2018-04-16 DIAGNOSIS — G629 Polyneuropathy, unspecified: Secondary | ICD-10-CM | POA: Diagnosis not present

## 2018-04-16 DIAGNOSIS — Z6829 Body mass index (BMI) 29.0-29.9, adult: Secondary | ICD-10-CM | POA: Diagnosis not present

## 2018-04-16 DIAGNOSIS — Z8673 Personal history of transient ischemic attack (TIA), and cerebral infarction without residual deficits: Secondary | ICD-10-CM | POA: Diagnosis not present

## 2018-04-16 DIAGNOSIS — Z87828 Personal history of other (healed) physical injury and trauma: Secondary | ICD-10-CM | POA: Diagnosis not present

## 2018-04-16 DIAGNOSIS — M5410 Radiculopathy, site unspecified: Secondary | ICD-10-CM | POA: Diagnosis not present

## 2018-04-16 DIAGNOSIS — R2689 Other abnormalities of gait and mobility: Secondary | ICD-10-CM | POA: Diagnosis not present

## 2018-04-16 DIAGNOSIS — M545 Low back pain: Secondary | ICD-10-CM | POA: Diagnosis not present

## 2018-05-04 DIAGNOSIS — R0602 Shortness of breath: Secondary | ICD-10-CM | POA: Diagnosis not present

## 2018-05-04 DIAGNOSIS — M4800 Spinal stenosis, site unspecified: Secondary | ICD-10-CM | POA: Diagnosis not present

## 2018-05-04 DIAGNOSIS — G4733 Obstructive sleep apnea (adult) (pediatric): Secondary | ICD-10-CM | POA: Diagnosis not present

## 2018-05-04 DIAGNOSIS — G4734 Idiopathic sleep related nonobstructive alveolar hypoventilation: Secondary | ICD-10-CM | POA: Diagnosis not present

## 2018-05-04 DIAGNOSIS — R269 Unspecified abnormalities of gait and mobility: Secondary | ICD-10-CM | POA: Diagnosis not present

## 2018-05-04 DIAGNOSIS — I2782 Chronic pulmonary embolism: Secondary | ICD-10-CM | POA: Diagnosis not present

## 2018-05-05 ENCOUNTER — Other Ambulatory Visit: Payer: Self-pay | Admitting: Neurology

## 2018-05-13 DIAGNOSIS — R2689 Other abnormalities of gait and mobility: Secondary | ICD-10-CM | POA: Diagnosis not present

## 2018-05-13 DIAGNOSIS — G8250 Quadriplegia, unspecified: Secondary | ICD-10-CM | POA: Diagnosis not present

## 2018-05-13 DIAGNOSIS — M545 Low back pain: Secondary | ICD-10-CM | POA: Diagnosis not present

## 2018-05-13 DIAGNOSIS — M5410 Radiculopathy, site unspecified: Secondary | ICD-10-CM | POA: Diagnosis not present

## 2018-05-13 DIAGNOSIS — G629 Polyneuropathy, unspecified: Secondary | ICD-10-CM | POA: Diagnosis not present

## 2018-05-18 ENCOUNTER — Telehealth: Payer: Self-pay | Admitting: Neurology

## 2018-05-18 NOTE — Telephone Encounter (Signed)
I called the patient.  The patient believes that his pump alarm has gone off, his next refill date is not until 16 June 2018.  The pump is old enough that could be that the battery is starting to fail, the patient will need to come in tomorrow and have the pump interrogated.

## 2018-05-18 NOTE — Telephone Encounter (Signed)
Patient calling to discuss his pump. He has been hearing a whistle sound for 3 days which lasts a few seconds.If refill is running low would he hear this noise or is battery running low. Please call and discuss.

## 2018-05-18 NOTE — Telephone Encounter (Signed)
I added pt to the schedule at 4pm with [name] tomorrow

## 2018-05-19 ENCOUNTER — Ambulatory Visit: Payer: Self-pay | Admitting: Neurology

## 2018-05-19 ENCOUNTER — Encounter: Payer: Self-pay | Admitting: Neurology

## 2018-05-19 VITALS — BP 141/90 | HR 92 | Ht 72.0 in | Wt 215.5 lb

## 2018-05-19 DIAGNOSIS — G8250 Quadriplegia, unspecified: Secondary | ICD-10-CM

## 2018-05-19 NOTE — Telephone Encounter (Signed)
I called [facility] where patient initially had baclofen pump placed back on 08/13/2011. Spoke with [employer]. Pt would be a new patient since it has been greater than 3 years. Asked urgent referral be placed for pt to be seen for baclofen pump replacement. She transferred me to Dr. Samson Frederic office and I spoke with Lynwood Dawley. Their phone number: [phone]. She placed pt on their schedule for 06-03-18 with Dr. Samson Frederic. She will add him to wait list.   She is also going to try to reach out to Dr. Samson Frederic to see if pt can be seen sooner.  She asked that after [name] sees pt this afternoon for him to call [phone] for Dr. Samson Frederic to be paged and they can discuss pt care and see if he can be seen sooner if need be. She also asked referral be sent to them. She will call our office back if she is able to reach Dr. Samson Frederic in the meantime.  Dr. Samson Frederic only works in the clinic 2 days per week. He is off on 05/25/18.

## 2018-05-19 NOTE — Telephone Encounter (Signed)
The patient was seen today, the pump was interrogated, ERI alarm has gone off, pump indicates that it should be replaced before 11 August 2018.  The patient already has an appointment to be seen on 03 June 2018 by Dr. Samson Frederic.  Next alarm date is on 16 June 2018.

## 2018-05-19 NOTE — Telephone Encounter (Signed)
Dara called back from [facility]. Verified pump will need to be replaced within the next couple months. ERI 08-11-2018. She verbalized understanding. She will contact patient about appt 06-03-18, verify info. She will call back if she has any further questions/concerns.

## 2018-05-19 NOTE — Progress Notes (Signed)
Baclofen pump was interrogated, the battery is failing, needs to be replaced before August 11, 2018.  A neurosurgical referral has already been made for October 31.

## 2018-06-03 DIAGNOSIS — R252 Cramp and spasm: Secondary | ICD-10-CM | POA: Diagnosis not present

## 2018-06-03 DIAGNOSIS — G4733 Obstructive sleep apnea (adult) (pediatric): Secondary | ICD-10-CM | POA: Diagnosis not present

## 2018-06-03 DIAGNOSIS — Z4549 Encounter for adjustment and management of other implanted nervous system device: Secondary | ICD-10-CM | POA: Diagnosis not present

## 2018-06-03 DIAGNOSIS — S14107S Unspecified injury at C7 level of cervical spinal cord, sequela: Secondary | ICD-10-CM | POA: Diagnosis not present

## 2018-06-03 DIAGNOSIS — F17220 Nicotine dependence, chewing tobacco, uncomplicated: Secondary | ICD-10-CM | POA: Diagnosis not present

## 2018-06-03 DIAGNOSIS — Z7982 Long term (current) use of aspirin: Secondary | ICD-10-CM | POA: Diagnosis not present

## 2018-06-03 DIAGNOSIS — G959 Disease of spinal cord, unspecified: Secondary | ICD-10-CM | POA: Diagnosis not present

## 2018-06-03 DIAGNOSIS — I10 Essential (primary) hypertension: Secondary | ICD-10-CM | POA: Diagnosis not present

## 2018-06-03 DIAGNOSIS — Z978 Presence of other specified devices: Secondary | ICD-10-CM | POA: Diagnosis not present

## 2018-06-03 DIAGNOSIS — K219 Gastro-esophageal reflux disease without esophagitis: Secondary | ICD-10-CM | POA: Diagnosis not present

## 2018-06-03 DIAGNOSIS — T85695A Other mechanical complication of other nervous system device, implant or graft, initial encounter: Secondary | ICD-10-CM | POA: Diagnosis not present

## 2018-06-04 DIAGNOSIS — M4800 Spinal stenosis, site unspecified: Secondary | ICD-10-CM | POA: Diagnosis not present

## 2018-06-04 DIAGNOSIS — R269 Unspecified abnormalities of gait and mobility: Secondary | ICD-10-CM | POA: Diagnosis not present

## 2018-06-04 DIAGNOSIS — I2782 Chronic pulmonary embolism: Secondary | ICD-10-CM | POA: Diagnosis not present

## 2018-06-04 DIAGNOSIS — G4733 Obstructive sleep apnea (adult) (pediatric): Secondary | ICD-10-CM | POA: Diagnosis not present

## 2018-06-04 DIAGNOSIS — G4734 Idiopathic sleep related nonobstructive alveolar hypoventilation: Secondary | ICD-10-CM | POA: Diagnosis not present

## 2018-06-04 DIAGNOSIS — R0602 Shortness of breath: Secondary | ICD-10-CM | POA: Diagnosis not present

## 2018-06-07 ENCOUNTER — Ambulatory Visit: Payer: Medicare PPO | Admitting: Neurology

## 2018-06-09 DIAGNOSIS — Z462 Encounter for fitting and adjustment of other devices related to nervous system and special senses: Secondary | ICD-10-CM | POA: Diagnosis not present

## 2018-06-09 DIAGNOSIS — S14107S Unspecified injury at C7 level of cervical spinal cord, sequela: Secondary | ICD-10-CM | POA: Diagnosis not present

## 2018-06-09 DIAGNOSIS — G4733 Obstructive sleep apnea (adult) (pediatric): Secondary | ICD-10-CM | POA: Diagnosis not present

## 2018-06-09 DIAGNOSIS — R252 Cramp and spasm: Secondary | ICD-10-CM | POA: Diagnosis not present

## 2018-06-09 DIAGNOSIS — M4712 Other spondylosis with myelopathy, cervical region: Secondary | ICD-10-CM | POA: Diagnosis not present

## 2018-06-09 DIAGNOSIS — G959 Disease of spinal cord, unspecified: Secondary | ICD-10-CM | POA: Diagnosis not present

## 2018-06-09 DIAGNOSIS — F17220 Nicotine dependence, chewing tobacco, uncomplicated: Secondary | ICD-10-CM | POA: Diagnosis not present

## 2018-06-09 DIAGNOSIS — Z4549 Encounter for adjustment and management of other implanted nervous system device: Secondary | ICD-10-CM | POA: Diagnosis not present

## 2018-06-09 DIAGNOSIS — I10 Essential (primary) hypertension: Secondary | ICD-10-CM | POA: Diagnosis not present

## 2018-06-09 DIAGNOSIS — Z7982 Long term (current) use of aspirin: Secondary | ICD-10-CM | POA: Diagnosis not present

## 2018-06-09 DIAGNOSIS — S14109S Unspecified injury at unspecified level of cervical spinal cord, sequela: Secondary | ICD-10-CM | POA: Diagnosis not present

## 2018-06-09 DIAGNOSIS — Z451 Encounter for adjustment and management of infusion pump: Secondary | ICD-10-CM | POA: Diagnosis not present

## 2018-06-09 DIAGNOSIS — K219 Gastro-esophageal reflux disease without esophagitis: Secondary | ICD-10-CM | POA: Diagnosis not present

## 2018-06-13 DIAGNOSIS — M5410 Radiculopathy, site unspecified: Secondary | ICD-10-CM | POA: Diagnosis not present

## 2018-06-13 DIAGNOSIS — G629 Polyneuropathy, unspecified: Secondary | ICD-10-CM | POA: Diagnosis not present

## 2018-06-13 DIAGNOSIS — M545 Low back pain: Secondary | ICD-10-CM | POA: Diagnosis not present

## 2018-06-13 DIAGNOSIS — R2689 Other abnormalities of gait and mobility: Secondary | ICD-10-CM | POA: Diagnosis not present

## 2018-06-13 DIAGNOSIS — G8250 Quadriplegia, unspecified: Secondary | ICD-10-CM | POA: Diagnosis not present

## 2018-06-24 DIAGNOSIS — Z978 Presence of other specified devices: Secondary | ICD-10-CM | POA: Diagnosis not present

## 2018-06-24 DIAGNOSIS — Z4802 Encounter for removal of sutures: Secondary | ICD-10-CM | POA: Diagnosis not present

## 2018-06-29 ENCOUNTER — Ambulatory Visit: Payer: Medicare PPO | Admitting: Neurology

## 2018-07-04 DIAGNOSIS — R269 Unspecified abnormalities of gait and mobility: Secondary | ICD-10-CM | POA: Diagnosis not present

## 2018-07-04 DIAGNOSIS — G4734 Idiopathic sleep related nonobstructive alveolar hypoventilation: Secondary | ICD-10-CM | POA: Diagnosis not present

## 2018-07-04 DIAGNOSIS — G4733 Obstructive sleep apnea (adult) (pediatric): Secondary | ICD-10-CM | POA: Diagnosis not present

## 2018-07-04 DIAGNOSIS — R0602 Shortness of breath: Secondary | ICD-10-CM | POA: Diagnosis not present

## 2018-07-04 DIAGNOSIS — M4800 Spinal stenosis, site unspecified: Secondary | ICD-10-CM | POA: Diagnosis not present

## 2018-07-04 DIAGNOSIS — I2782 Chronic pulmonary embolism: Secondary | ICD-10-CM | POA: Diagnosis not present

## 2018-07-13 DIAGNOSIS — G8250 Quadriplegia, unspecified: Secondary | ICD-10-CM | POA: Diagnosis not present

## 2018-07-13 DIAGNOSIS — M5410 Radiculopathy, site unspecified: Secondary | ICD-10-CM | POA: Diagnosis not present

## 2018-07-13 DIAGNOSIS — G629 Polyneuropathy, unspecified: Secondary | ICD-10-CM | POA: Diagnosis not present

## 2018-07-13 DIAGNOSIS — M545 Low back pain: Secondary | ICD-10-CM | POA: Diagnosis not present

## 2018-07-13 DIAGNOSIS — R2689 Other abnormalities of gait and mobility: Secondary | ICD-10-CM | POA: Diagnosis not present

## 2018-07-23 DIAGNOSIS — Z4589 Encounter for adjustment and management of other implanted devices: Secondary | ICD-10-CM | POA: Diagnosis not present

## 2018-08-04 DIAGNOSIS — R0602 Shortness of breath: Secondary | ICD-10-CM | POA: Diagnosis not present

## 2018-08-04 DIAGNOSIS — M4800 Spinal stenosis, site unspecified: Secondary | ICD-10-CM | POA: Diagnosis not present

## 2018-08-04 DIAGNOSIS — G4734 Idiopathic sleep related nonobstructive alveolar hypoventilation: Secondary | ICD-10-CM | POA: Diagnosis not present

## 2018-08-04 DIAGNOSIS — R269 Unspecified abnormalities of gait and mobility: Secondary | ICD-10-CM | POA: Diagnosis not present

## 2018-08-04 DIAGNOSIS — G4733 Obstructive sleep apnea (adult) (pediatric): Secondary | ICD-10-CM | POA: Diagnosis not present

## 2018-08-04 DIAGNOSIS — I2782 Chronic pulmonary embolism: Secondary | ICD-10-CM | POA: Diagnosis not present

## 2018-08-13 DIAGNOSIS — G629 Polyneuropathy, unspecified: Secondary | ICD-10-CM | POA: Diagnosis not present

## 2018-08-13 DIAGNOSIS — M545 Low back pain: Secondary | ICD-10-CM | POA: Diagnosis not present

## 2018-08-13 DIAGNOSIS — M5410 Radiculopathy, site unspecified: Secondary | ICD-10-CM | POA: Diagnosis not present

## 2018-08-13 DIAGNOSIS — R2689 Other abnormalities of gait and mobility: Secondary | ICD-10-CM | POA: Diagnosis not present

## 2018-08-13 DIAGNOSIS — G8250 Quadriplegia, unspecified: Secondary | ICD-10-CM | POA: Diagnosis not present

## 2018-08-16 DIAGNOSIS — M7042 Prepatellar bursitis, left knee: Secondary | ICD-10-CM | POA: Diagnosis not present

## 2018-08-17 ENCOUNTER — Telehealth: Payer: Self-pay | Admitting: Neurology

## 2018-08-17 NOTE — Telephone Encounter (Signed)
I called the patient, left a message.  The patient had a pump replacement done recently, he believes that the next alarm date is around March 6, I left a message, will be happy to do the baclofen replacement then.  I will get an appointment set up for him.  If he is not sure when the alarm date is, we will need to get him in soon and interrogate the pump.

## 2018-08-17 NOTE — Telephone Encounter (Signed)
Pt is calling wanting to discuss his pump appts that he needs. Pt states that his Surgeon Dr. Samson Frederic states he is good to wait till Aug 2020 but according to pt he thinks that he needs to be seen around March 6 since he has been coming in around every 4 months. Please advise.

## 2018-08-18 NOTE — Telephone Encounter (Signed)
I called the patient.  The patient likely has a pump reservoir of 40 cc, the surgeons are telling him when the alarm date is due, but the back pain itself does not last for more than 6 months, the baclofen will need to be replaced before then.  I will see the patient in the office in March, we will interrogate the pump at that time.

## 2018-08-18 NOTE — Telephone Encounter (Signed)
Requested a call back to further discuss this message.  

## 2018-08-18 NOTE — Telephone Encounter (Signed)
Pt has called back please call pt as soon as available.

## 2018-08-18 NOTE — Telephone Encounter (Signed)
Patient returned my call. Pt stated to me on 06/09/2018 his replacement baclofen pump was placed. When the pt returned for his 2 week f/u he was advised by the surgeons office he should not need a refill until Aug of 2020.  Pt stated he did not think this recommendation was correct because Dr. Stann Mainland would refill his pump every 4 months. This would put the pt needing a refill around March 6th of 2020. I advised we could schedule a pump refill for 10/08/2018 at 12 pm check in time of 11:30 am and [name] could interrogate if the pump refill was do. Pt was agreeable to this plan and appt was scheduled. I advised I would fwd message to [name] to review. I advised pt if [name] had other recommendations he would be notified, but if MD was agreeable to appt we would see him in March.   Pt was agreeable and had no further questions/concerns at this time.

## 2018-09-02 DIAGNOSIS — Z6829 Body mass index (BMI) 29.0-29.9, adult: Secondary | ICD-10-CM | POA: Diagnosis not present

## 2018-09-02 DIAGNOSIS — Z0000 Encounter for general adult medical examination without abnormal findings: Secondary | ICD-10-CM | POA: Diagnosis not present

## 2018-09-02 DIAGNOSIS — M109 Gout, unspecified: Secondary | ICD-10-CM | POA: Diagnosis not present

## 2018-09-04 DIAGNOSIS — R269 Unspecified abnormalities of gait and mobility: Secondary | ICD-10-CM | POA: Diagnosis not present

## 2018-09-04 DIAGNOSIS — M4800 Spinal stenosis, site unspecified: Secondary | ICD-10-CM | POA: Diagnosis not present

## 2018-09-04 DIAGNOSIS — I2782 Chronic pulmonary embolism: Secondary | ICD-10-CM | POA: Diagnosis not present

## 2018-09-04 DIAGNOSIS — R0602 Shortness of breath: Secondary | ICD-10-CM | POA: Diagnosis not present

## 2018-09-04 DIAGNOSIS — G4733 Obstructive sleep apnea (adult) (pediatric): Secondary | ICD-10-CM | POA: Diagnosis not present

## 2018-09-04 DIAGNOSIS — G4734 Idiopathic sleep related nonobstructive alveolar hypoventilation: Secondary | ICD-10-CM | POA: Diagnosis not present

## 2018-09-10 ENCOUNTER — Other Ambulatory Visit: Payer: Self-pay | Admitting: Neurology

## 2018-09-12 DIAGNOSIS — J111 Influenza due to unidentified influenza virus with other respiratory manifestations: Secondary | ICD-10-CM | POA: Diagnosis not present

## 2018-09-12 DIAGNOSIS — J209 Acute bronchitis, unspecified: Secondary | ICD-10-CM | POA: Diagnosis not present

## 2018-09-12 DIAGNOSIS — R509 Fever, unspecified: Secondary | ICD-10-CM | POA: Diagnosis not present

## 2018-09-12 DIAGNOSIS — J0100 Acute maxillary sinusitis, unspecified: Secondary | ICD-10-CM | POA: Diagnosis not present

## 2018-09-13 DIAGNOSIS — M5410 Radiculopathy, site unspecified: Secondary | ICD-10-CM | POA: Diagnosis not present

## 2018-09-13 DIAGNOSIS — M545 Low back pain: Secondary | ICD-10-CM | POA: Diagnosis not present

## 2018-09-13 DIAGNOSIS — G8250 Quadriplegia, unspecified: Secondary | ICD-10-CM | POA: Diagnosis not present

## 2018-09-13 DIAGNOSIS — G629 Polyneuropathy, unspecified: Secondary | ICD-10-CM | POA: Diagnosis not present

## 2018-09-13 DIAGNOSIS — R2689 Other abnormalities of gait and mobility: Secondary | ICD-10-CM | POA: Diagnosis not present

## 2018-09-27 ENCOUNTER — Ambulatory Visit: Payer: Medicare PPO | Admitting: Neurology

## 2018-09-28 ENCOUNTER — Other Ambulatory Visit: Payer: Self-pay | Admitting: Neurology

## 2018-10-03 DIAGNOSIS — G4733 Obstructive sleep apnea (adult) (pediatric): Secondary | ICD-10-CM | POA: Diagnosis not present

## 2018-10-03 DIAGNOSIS — R269 Unspecified abnormalities of gait and mobility: Secondary | ICD-10-CM | POA: Diagnosis not present

## 2018-10-03 DIAGNOSIS — M4800 Spinal stenosis, site unspecified: Secondary | ICD-10-CM | POA: Diagnosis not present

## 2018-10-03 DIAGNOSIS — I2782 Chronic pulmonary embolism: Secondary | ICD-10-CM | POA: Diagnosis not present

## 2018-10-08 ENCOUNTER — Telehealth: Payer: Self-pay | Admitting: Neurology

## 2018-10-08 ENCOUNTER — Ambulatory Visit: Payer: Medicare PPO | Admitting: Neurology

## 2018-10-08 ENCOUNTER — Encounter: Payer: Self-pay | Admitting: Neurology

## 2018-10-08 VITALS — BP 137/86 | HR 94 | Ht 72.0 in | Wt 224.0 lb

## 2018-10-08 DIAGNOSIS — G8250 Quadriplegia, unspecified: Secondary | ICD-10-CM | POA: Diagnosis not present

## 2018-10-08 MED ORDER — BACLOFEN 40 MG/20ML IT SOLN
40.0000 mg | Freq: Once | INTRATHECAL | Status: AC
  Administered 2018-10-08: 40 mg via INTRATHECAL

## 2018-10-08 NOTE — Telephone Encounter (Signed)
I called pt, offered him an appt on 02/11/2019 at 12 noon with [name] for his pump refill. Pt is agreeable to this and I will cancel his August 2020 appt. Pt verbalized understanding of new appt date and time.

## 2018-10-08 NOTE — Procedures (Signed)
     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  The patient has baclofen pump in place, he comes in for baclofen pump refill.  The patient has noted some recent increase in low back pain, the spasticity in the legs is increased accordingly.  He wishes to have a slight increase in his pump rate.  Baclofen pump refill note  The baclofen pump site was cleaned with Betadine solution. A 21-gauge needle was inserted into the pump port site. Approximately 23 cc of residual baclofen was removed, the pump indicates a 23 cc residual. 20 cc of replacement baclofen was placed into the pump at 2000 mcg/cc concentration.  The pump was reprogrammed for the following settings: The pump was changed from a rate of 270.1 mcg/day to a rate of 285 mcg/day, this is a simple continuous rate.  The alarm volume is set at 2.0 cc. The next alarm date is February 11, 2019.  The patient tolerated the procedure well. There were no complications of the above procedure.  The NDC number is [phone] The baclofen expiration date is October 2021. The baclofen lot number is [id number].

## 2018-10-08 NOTE — Telephone Encounter (Signed)
Scheduled pts pump refill for next opening with [name]- 8/7 pt needing to be seen on or before July 10th. Please advise

## 2018-10-11 ENCOUNTER — Telehealth: Payer: Self-pay | Admitting: Neurology

## 2018-10-11 NOTE — Telephone Encounter (Signed)
I called the patient.  The patient is walking with his back flexed, he also notes some sagging of the knees, this could be related to back fine, but the patient does report pain in the back when he tries to stand upright suggesting a structural problem with the low back.  He believes that he may need to have back surgery in the future.  The patient will come in and I will cut back on his baclofen rate to see if the problem with back flexion and flexion at the knees improves.

## 2018-10-11 NOTE — Telephone Encounter (Signed)
I contacted the pt. He states he wanted to know if his baclofen provided through his pump could be causing lower back weakness that causes him to bend over at his waist if he is standing for a long period of time.  Pt states he meant to discuss this with MD at his office visit but forgot. I advised I would fwd message to [name] to review and pt verbalized appreciation. Best CB # is [phone].

## 2018-10-11 NOTE — Telephone Encounter (Signed)
Pt is asking for a call to discuss if the medication he gets in his pump could cause weakness in his back after standing for a long period of time.  Pt is asking for a call from RN to discuss

## 2018-10-12 DIAGNOSIS — G629 Polyneuropathy, unspecified: Secondary | ICD-10-CM | POA: Diagnosis not present

## 2018-10-12 DIAGNOSIS — G8250 Quadriplegia, unspecified: Secondary | ICD-10-CM | POA: Diagnosis not present

## 2018-10-12 DIAGNOSIS — R2689 Other abnormalities of gait and mobility: Secondary | ICD-10-CM | POA: Diagnosis not present

## 2018-10-12 DIAGNOSIS — M5410 Radiculopathy, site unspecified: Secondary | ICD-10-CM | POA: Diagnosis not present

## 2018-10-12 DIAGNOSIS — M545 Low back pain: Secondary | ICD-10-CM | POA: Diagnosis not present

## 2018-10-12 NOTE — Telephone Encounter (Signed)
Requested a call back to discuss setting up a time for the pt to come and have [name] look at pump settings.

## 2018-10-13 NOTE — Telephone Encounter (Signed)
I contacted the pt. He states he can come on 10/18/18 at 4:15 pm. Pt will come in to have [name] evaluate pump. Per [name] no office visit is needed.

## 2018-10-18 ENCOUNTER — Telehealth: Payer: Self-pay | Admitting: *Deleted

## 2018-10-18 ENCOUNTER — Other Ambulatory Visit: Payer: Self-pay | Admitting: Orthopedic Surgery

## 2018-10-18 DIAGNOSIS — M545 Low back pain, unspecified: Secondary | ICD-10-CM

## 2018-10-18 NOTE — Telephone Encounter (Signed)
Patient came in this afternoon.  He had been told to come over for a pump adjustment at 4:15PM today.  He was not on the schedule therefore did not get a call that the office was closed.  He would like to come on Wed. Afternoon at 4 if at all possible.  Please call.

## 2018-10-19 NOTE — Telephone Encounter (Signed)
I attempted to reach the pt again. Pt was not available. Lvm advising we could see the pt tomorrow at the end of the day on 10/20/18 at 415 if he is still agreeable. Pt advised to f/u that he received me vm.

## 2018-10-19 NOTE — Telephone Encounter (Signed)
Attempted to reach the pt, he was unavailable. Will try again at a later time. Pt can come wed (10/20/18) to have [name] evaluate pump and the end of the day.

## 2018-10-19 NOTE — Telephone Encounter (Signed)
Pt returned call and agreed to be here at 4:15pm on 10/20/18.

## 2018-10-20 ENCOUNTER — Telehealth: Payer: Self-pay | Admitting: Neurology

## 2018-10-20 NOTE — Telephone Encounter (Signed)
The patient came in today for a baclofen pump adjustment, he has noted some difficulty standing upright, his legs may collapse at times, the baclofen pump will be readjusted to lower the infusion rate.  The infusion rate was lowered from 285.4 mcg/day with a simple continuous rate to a 12% decreased to 249.8 mcg/day.  The patient will follow-up next week, if his spasticity worsens significantly, the pump can be readjusted upward otherwise, we may continue to lower the pump rate.

## 2018-10-25 ENCOUNTER — Other Ambulatory Visit: Payer: Self-pay | Admitting: Neurology

## 2018-10-28 ENCOUNTER — Other Ambulatory Visit: Payer: Medicare PPO

## 2018-10-28 NOTE — Telephone Encounter (Signed)
I called the patient.  The patient has had a slight increase in spasticity, still has trouble with standing upright, will have him come in for a baclofen pump adjustment next Monday after 4 PM.

## 2018-10-28 NOTE — Telephone Encounter (Signed)
Noted  

## 2018-10-28 NOTE — Telephone Encounter (Signed)
FYI Pt has called to inform that the reason as to why he did not come for his pump to be checked is because the MRI and CT scan were cancelled.  Pt however would like to come in around 4:15 this upcoming Monday for Dr Anne Hahn to make a slight adjustment to his Baclofen pump.  If this is not an option he is asking to be called

## 2018-11-03 DIAGNOSIS — I2782 Chronic pulmonary embolism: Secondary | ICD-10-CM | POA: Diagnosis not present

## 2018-11-03 DIAGNOSIS — R269 Unspecified abnormalities of gait and mobility: Secondary | ICD-10-CM | POA: Diagnosis not present

## 2018-11-03 DIAGNOSIS — G4733 Obstructive sleep apnea (adult) (pediatric): Secondary | ICD-10-CM | POA: Diagnosis not present

## 2018-11-03 DIAGNOSIS — M4800 Spinal stenosis, site unspecified: Secondary | ICD-10-CM | POA: Diagnosis not present

## 2018-11-12 DIAGNOSIS — G8250 Quadriplegia, unspecified: Secondary | ICD-10-CM | POA: Diagnosis not present

## 2018-11-12 DIAGNOSIS — M545 Low back pain: Secondary | ICD-10-CM | POA: Diagnosis not present

## 2018-11-12 DIAGNOSIS — R2689 Other abnormalities of gait and mobility: Secondary | ICD-10-CM | POA: Diagnosis not present

## 2018-11-12 DIAGNOSIS — M5410 Radiculopathy, site unspecified: Secondary | ICD-10-CM | POA: Diagnosis not present

## 2018-11-12 DIAGNOSIS — G629 Polyneuropathy, unspecified: Secondary | ICD-10-CM | POA: Diagnosis not present

## 2018-11-19 DIAGNOSIS — S83241A Other tear of medial meniscus, current injury, right knee, initial encounter: Secondary | ICD-10-CM | POA: Diagnosis not present

## 2018-12-03 DIAGNOSIS — M4800 Spinal stenosis, site unspecified: Secondary | ICD-10-CM | POA: Diagnosis not present

## 2018-12-03 DIAGNOSIS — R269 Unspecified abnormalities of gait and mobility: Secondary | ICD-10-CM | POA: Diagnosis not present

## 2018-12-03 DIAGNOSIS — G4733 Obstructive sleep apnea (adult) (pediatric): Secondary | ICD-10-CM | POA: Diagnosis not present

## 2018-12-03 DIAGNOSIS — I2782 Chronic pulmonary embolism: Secondary | ICD-10-CM | POA: Diagnosis not present

## 2018-12-12 DIAGNOSIS — R2689 Other abnormalities of gait and mobility: Secondary | ICD-10-CM | POA: Diagnosis not present

## 2018-12-12 DIAGNOSIS — M5410 Radiculopathy, site unspecified: Secondary | ICD-10-CM | POA: Diagnosis not present

## 2018-12-12 DIAGNOSIS — G8250 Quadriplegia, unspecified: Secondary | ICD-10-CM | POA: Diagnosis not present

## 2018-12-12 DIAGNOSIS — G629 Polyneuropathy, unspecified: Secondary | ICD-10-CM | POA: Diagnosis not present

## 2018-12-12 DIAGNOSIS — M545 Low back pain: Secondary | ICD-10-CM | POA: Diagnosis not present

## 2018-12-16 ENCOUNTER — Telehealth: Payer: Self-pay | Admitting: Neurology

## 2018-12-16 NOTE — Telephone Encounter (Signed)
Patient called in stating that he is going to have a CT and a MRI done on 12/29/18 at [facility]... He stated that he will be by our office around 1:40-2:15 pm to get his pump checked out.

## 2018-12-20 NOTE — Telephone Encounter (Signed)
Noted  

## 2018-12-24 ENCOUNTER — Other Ambulatory Visit: Payer: Medicare PPO

## 2018-12-28 NOTE — Telephone Encounter (Signed)
FYI Pt has called asking that a reminder be put in the system that on tomorrow he will have his CT and MRI at 1pm and that he is expecting to come to [facility] for Dr Anne Hahn around 1:45-2:15 to make sure that his baclofen pump is still connected on after the scans.  No call back requested

## 2018-12-29 ENCOUNTER — Ambulatory Visit
Admission: RE | Admit: 2018-12-29 | Discharge: 2018-12-29 | Disposition: A | Discharge status: Home / Self Care | Payer: Medicare PPO | Source: Ambulatory Visit | Attending: Orthopedic Surgery | Admitting: Orthopedic Surgery

## 2018-12-29 ENCOUNTER — Other Ambulatory Visit: Payer: Self-pay

## 2018-12-29 ENCOUNTER — Telehealth: Payer: Self-pay | Admitting: Neurology

## 2018-12-29 DIAGNOSIS — M545 Low back pain, unspecified: Secondary | ICD-10-CM

## 2018-12-29 DIAGNOSIS — M48061 Spinal stenosis, lumbar region without neurogenic claudication: Secondary | ICD-10-CM | POA: Diagnosis not present

## 2018-12-29 NOTE — Telephone Encounter (Signed)
The patient came in following MRI evaluation to recheck at home, the patient wanted the pump rate reduced, this was reduced 8%, from to 49.8 mcg/day to a to 30.1 mcg/day simple continuous rate.  His next alarm date is on 05 March 2019.

## 2019-01-03 DIAGNOSIS — R269 Unspecified abnormalities of gait and mobility: Secondary | ICD-10-CM | POA: Diagnosis not present

## 2019-01-03 DIAGNOSIS — I2782 Chronic pulmonary embolism: Secondary | ICD-10-CM | POA: Diagnosis not present

## 2019-01-03 DIAGNOSIS — G4733 Obstructive sleep apnea (adult) (pediatric): Secondary | ICD-10-CM | POA: Diagnosis not present

## 2019-01-03 DIAGNOSIS — M4800 Spinal stenosis, site unspecified: Secondary | ICD-10-CM | POA: Diagnosis not present

## 2019-01-03 DIAGNOSIS — M5416 Radiculopathy, lumbar region: Secondary | ICD-10-CM | POA: Diagnosis not present

## 2019-01-12 DIAGNOSIS — G629 Polyneuropathy, unspecified: Secondary | ICD-10-CM | POA: Diagnosis not present

## 2019-01-12 DIAGNOSIS — G8250 Quadriplegia, unspecified: Secondary | ICD-10-CM | POA: Diagnosis not present

## 2019-01-12 DIAGNOSIS — R2689 Other abnormalities of gait and mobility: Secondary | ICD-10-CM | POA: Diagnosis not present

## 2019-01-12 DIAGNOSIS — M5410 Radiculopathy, site unspecified: Secondary | ICD-10-CM | POA: Diagnosis not present

## 2019-01-12 DIAGNOSIS — M545 Low back pain: Secondary | ICD-10-CM | POA: Diagnosis not present

## 2019-01-17 ENCOUNTER — Other Ambulatory Visit: Payer: Self-pay | Admitting: *Deleted

## 2019-01-18 ENCOUNTER — Other Ambulatory Visit: Payer: Self-pay

## 2019-01-18 ENCOUNTER — Encounter: Payer: Self-pay | Admitting: Vascular Surgery

## 2019-01-18 ENCOUNTER — Ambulatory Visit: Payer: Medicare PPO | Admitting: Vascular Surgery

## 2019-01-18 VITALS — BP 135/88 | HR 96 | Temp 97.4°F | Resp 18 | Ht 72.0 in | Wt 255.0 lb

## 2019-01-18 DIAGNOSIS — M5137 Other intervertebral disc degeneration, lumbosacral region: Secondary | ICD-10-CM
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
## 2019-01-19 ENCOUNTER — Other Ambulatory Visit: Payer: Self-pay | Admitting: Orthopedic Surgery

## 2019-01-27 ENCOUNTER — Encounter (HOSPITAL_COMMUNITY)
Admission: RE | Admit: 2019-01-27 | Discharge: 2019-01-27 | Disposition: A | Discharge status: Home / Self Care | Payer: Medicare PPO | Source: Ambulatory Visit | Attending: Orthopedic Surgery | Admitting: Orthopedic Surgery

## 2019-01-27 ENCOUNTER — Other Ambulatory Visit: Payer: Self-pay

## 2019-01-27 ENCOUNTER — Encounter (HOSPITAL_COMMUNITY): Payer: Self-pay

## 2019-01-27 ENCOUNTER — Other Ambulatory Visit: Payer: Self-pay | Admitting: Orthopedic Surgery

## 2019-01-27 ENCOUNTER — Other Ambulatory Visit (HOSPITAL_COMMUNITY)
Admission: RE | Admit: 2019-01-27 | Discharge: 2019-01-27 | Disposition: A | Discharge status: Home / Self Care | Payer: Medicare PPO | Source: Ambulatory Visit | Attending: Orthopedic Surgery | Admitting: Orthopedic Surgery

## 2019-01-27 DIAGNOSIS — Z01818 Encounter for other preprocedural examination: Secondary | ICD-10-CM | POA: Insufficient documentation

## 2019-01-27 DIAGNOSIS — M545 Low back pain: Secondary | ICD-10-CM | POA: Diagnosis not present

## 2019-01-27 DIAGNOSIS — Z1159 Encounter for screening for other viral diseases: Secondary | ICD-10-CM | POA: Insufficient documentation

## 2019-01-27 HISTORY — DX: Peripheral vascular disease, unspecified: I73.9

## 2019-01-27 HISTORY — DX: Cardiac murmur, unspecified: R01.1

## 2019-01-27 LAB — TYPE AND SCREEN
ABO/RH(D): O POS
Antibody Screen: NEGATIVE

## 2019-01-27 LAB — COMPREHENSIVE METABOLIC PANEL
ALT: 191 U/L — ABNORMAL HIGH (ref 0–44)
AST: 250 U/L — ABNORMAL HIGH (ref 15–41)
Albumin: 3.6 g/dL (ref 3.5–5.0)
Alkaline Phosphatase: 119 U/L (ref 38–126)
Anion gap: 12 (ref 5–15)
BUN: 12 mg/dL (ref 6–20)
CO2: 25 mmol/L (ref 22–32)
Calcium: 8.9 mg/dL (ref 8.9–10.3)
Chloride: 100 mmol/L (ref 98–111)
Creatinine, Ser: 0.83 mg/dL (ref 0.61–1.24)
GFR calc Af Amer: >60 mL/min (ref >60)
GFR calc non Af Amer: >60 mL/min (ref >60)
Glucose, Bld: 118 mg/dL — ABNORMAL HIGH (ref 70–99)
Potassium: 4.3 mmol/L (ref 3.5–5.1)
Sodium: 137 mmol/L (ref 135–145)
Total Bilirubin: 0.9 mg/dL (ref 0.3–1.2)
Total Protein: 6.9 g/dL (ref 6.5–8.1)

## 2019-01-27 LAB — CBC WITH DIFFERENTIAL/PLATELET
Abs Immature Granulocytes: 0.02 10*3/uL (ref 0.00–0.07)
Basophils Absolute: 0.1 10*3/uL (ref 0.0–0.1)
Basophils Relative: 1 %
Eosinophils Absolute: 0.1 10*3/uL (ref 0.0–0.5)
Eosinophils Relative: 2 %
HCT: 50.4 % (ref 39.0–52.0)
Hemoglobin: 16.7 g/dL (ref 13.0–17.0)
Immature Granulocytes: 0 %
Lymphocytes Relative: 14 %
Lymphs Abs: 0.9 10*3/uL (ref 0.7–4.0)
MCH: 32.4 pg (ref 26.0–34.0)
MCHC: 33.1 g/dL (ref 30.0–36.0)
MCV: 97.9 fL (ref 80.0–100.0)
Monocytes Absolute: 0.6 10*3/uL (ref 0.1–1.0)
Monocytes Relative: 10 %
Neutro Abs: 4.4 10*3/uL (ref 1.7–7.7)
Neutrophils Relative %: 73 %
Platelets: 163 10*3/uL (ref 150–400)
RBC: 5.15 MIL/uL (ref 4.22–5.81)
RDW: 13 % (ref 11.5–15.5)
WBC: 6.1 10*3/uL (ref 4.0–10.5)
nRBC: 0 % (ref 0.0–0.2)

## 2019-01-27 LAB — SURGICAL PCR SCREEN
MRSA, PCR: NEGATIVE
Staphylococcus aureus: POSITIVE — AB

## 2019-01-27 LAB — APTT: aPTT: 31 seconds (ref 24–36)

## 2019-01-27 LAB — SARS CORONAVIRUS 2 (TAT 6-24 HRS): SARS Coronavirus 2: NEGATIVE

## 2019-01-27 LAB — PROTIME-INR
INR: 1.1 (ref 0.8–1.2)
Prothrombin Time: 14.3 seconds (ref 11.4–15.2)

## 2019-01-27 NOTE — Pre-Procedure Instructions (Signed)
Ralph [name]  01/27/2019   Your procedure is scheduled on Monday, June 29..  [facility] Main [facility]trance or Entrance "A" at 1200 noon                          Your surgery or procedure is scheduled for 3:00 PM   Call this number if you have problems the morning of surgery: [phone]  This is the number for the Pre- Surgical Desk.                For any other questions, please call [phone], Monday - Friday 8 AM - 4 PM.    Remember:  Do not eat after midnight Sunday, June 28.  Please finish the ENSURE Drink by 1200 noon.    You may drink clear liquids until 1200 noon.  Clear liquids allowed are:    Water, Juice (non-citric and without pulp), Carbonated beverages, Clear Tea, Black Coffee only, Plain Jell-O only, Gatorade and Plain Popsicles only    Take these medicines the morning of surgery with A SIP OF WATER:  gabapentin (NEURONTIN)             metoprolol succinate (TOPROL-XL)              omeprazole (PRILOSEC)              Take if needed: HYDROcodone-acetaminophen (NORCO/VICODIN) STOP taking Aspirin, Aspirin Products (Goody Powder, Excedrin Migraine), Ibuprofen (Advil), Naproxen (Aleve), Vitamins and All Herbal Products (ie Fish Oil).  Special instructions:  Bring your CPAP Mask           [facility]- Preparing For Surgery  Before surgery, you can play an important role. Because skin is not sterile, your skin needs to be as free of germs as possible. You can reduce the number of germs on your skin by washing with CHG (chlorahexidine gluconate) Soap before surgery.  CHG is an antiseptic cleaner which kills germs and bonds with the skin to continue killing germs even after washing.    Oral Hygiene is also important to reduce your risk of infection.  Remember - BRUSH YOUR TEETH THE MORNING OF SURGERY WITH YOUR REGULAR TOOTHPASTE  Please do not use if you have an allergy to CHG or antibacterial soaps. If your skin becomes reddened/irritated stop  using the CHG.  Do not shave (including legs and underarms) for at least 48 hours prior to first CHG shower. It is OK to shave your face.  Please follow these instructions carefully.   1. Shower the NIGHT BEFORE SURGERY and the MORNING OF SURGERY with CHG.   2. If you chose to wash your hair, wash your hair first as usual with your normal shampoo.  3. After you shampoo, wash your face and private area with the soap you use at home, then rinse your hair and body thoroughly to remove the shampoo and soap.  4. Use CHG as you would any other liquid soap. You can apply CHG directly to the skin and wash gently with a scrungie or a clean washcloth.   5. Apply the CHG Soap to your body ONLY FROM THE NECK DOWN.  Do not use on open wounds or open sores. Avoid contact with your eyes, ears, mouth and genitals (private parts).   6. Wash thoroughly, paying special attention to the area where your surgery will be performed.  7. Thoroughly rinse your body with warm water from the  neck down.  8. DO NOT shower/wash with your normal soap after using and rinsing off the CHG Soap.  9. Pat yourself dry with a CLEAN TOWEL.  10. Wear CLEAN PAJAMAS to bed the night before surgery, wear comfortable clothes the morning of surgery  11. Place CLEAN SHEETS on your bed the night of your first shower and DO NOT SLEEP WITH PETS.  Day of Surgery: Shower as stated Above Do not wear lotions, powders, or perfumes, or deodorant. Please wear clean clothes to the hospital/surgery center.   Remember to brush your teeth WITH YOUR REGULAR TOOTHPASTE  Do not wear jewelry, make-up or nail polish.  Do not shave 48 hours prior to surgery.  Men may shave face and neck.  Do not bring valuables to the hospital.  [facility] is not responsible for any belongings or valuables.  Contacts, dentures or bridgework may not be worn into surgery.  Leave your suitcase in the car.  After surgery it may be brought to your room.  For  patients admitted to the hospital, discharge time will be determined by your treatment team.  Patients discharged the day of surgery will not be allowed to drive home.   Please read over the following fact sheets that you were given.

## 2019-01-27 NOTE — Progress Notes (Signed)
Call to Dr. Laurena Bering office asked for order for consent for surgery scheduled for 01/31/2009.Spoke with Butch Penny.

## 2019-01-27 NOTE — Pre-Procedure Instructions (Signed)
Ralph [name]  01/27/2019   Your procedure is scheduled on Monday, June 29..  [facility], Main [facility]trance or Entrance "A" at 1200 noon                          Your surgery or procedure is scheduled for 3:00 PM   Call this number if you have problems the morning of surgery: [phone]  This is the number for the Pre- Surgical Desk.                For any other questions, please call [phone], Monday - Friday 8 AM - 4 PM.    Remember:  Do not eat after midnight Sunday, June 28.  You may drink clear liquids until 1200 noon.  Clear liquids allowed are:    Water, Juice (non-citric and without pulp), Carbonated beverages, Clear Tea, Black Coffee only, Plain Jell-O only, Gatorade and Plain Popsicles only    Take these medicines the morning of surgery with A SIP OF WATER:  gabapentin (NEURONTIN)             metoprolol succinate (TOPROL-XL)              omeprazole (PRILOSEC)  Take if needed: HYDROcodone-acetaminophen (NORCO/VICODIN) STOP taking Aspirin, Aspirin Products (Goody Powder, Excedrin Migraine), Ibuprofen (Advil), Naproxen (Aleve), Vitamins and All Herbal Products (ie Fish Oil).  Special instructions:  Bring your CPAP Mask           Ralph [name]- Preparing For Surgery  Before surgery, you can play an important role. Because skin is not sterile, your skin needs to be as free of germs as possible. You can reduce the number of germs on your skin by washing with CHG (chlorahexidine gluconate) Soap before surgery.  CHG is an antiseptic cleaner which kills germs and bonds with the skin to continue killing germs even after washing.    Oral Hygiene is also important to reduce your risk of infection.  Remember - BRUSH YOUR TEETH THE MORNING OF SURGERY WITH YOUR REGULAR TOOTHPASTE  Please do not use if you have an allergy to CHG or antibacterial soaps. If your skin becomes reddened/irritated stop using the CHG.  Do not shave (including legs and underarms)  for at least 48 hours prior to first CHG shower. It is OK to shave your face.  Please follow these instructions carefully.   1. Shower the NIGHT BEFORE SURGERY and the MORNING OF SURGERY with CHG.   2. If you chose to wash your hair, wash your hair first as usual with your normal shampoo.  3. After you shampoo, wash your face and private area with the soap you use at home, then rinse your hair and body thoroughly to remove the shampoo and soap.  4. Use CHG as you would any other liquid soap. You can apply CHG directly to the skin and wash gently with a scrungie or a clean washcloth.   5. Apply the CHG Soap to your body ONLY FROM THE NECK DOWN.  Do not use on open wounds or open sores. Avoid contact with your eyes, ears, mouth and genitals (private parts).   6. Wash thoroughly, paying special attention to the area where your surgery will be performed.  7. Thoroughly rinse your body with warm water from the neck down.  8. DO NOT shower/wash with your normal soap after using and rinsing off the CHG Soap.  9. Pat yourself  dry with a CLEAN TOWEL.  10. Wear CLEAN PAJAMAS to bed the night before surgery, wear comfortable clothes the morning of surgery  11. Place CLEAN SHEETS on your bed the night of your first shower and DO NOT SLEEP WITH PETS.  Day of Surgery: Shower as stated Above Do not wear lotions, powders, or perfumes, or deodorant. Please wear clean clothes to the hospital/surgery center.   Remember to brush your teeth WITH YOUR REGULAR TOOTHPASTE  Do not wear jewelry, make-up or nail polish.  Do not shave 48 hours prior to surgery.  Men may shave face and neck.  Do not bring valuables to the hospital.  [facility] is not responsible for any belongings or valuables.  Contacts, dentures or bridgework may not be worn into surgery.  Leave your suitcase in the car.  After surgery it may be brought to your room.  For patients admitted to the hospital, discharge time will be  determined by your treatment team.  Patients discharged the day of surgery will not be allowed to drive home.   Please read over the following fact sheets that you were given.

## 2019-01-27 NOTE — Progress Notes (Signed)
PCP - [facility] In [employer] - [place], recommended that he be f/u in one yr., has not seen Dr. Radford Pax since late 2018  Chest x-ray - 11/19 EKG - will have done today Stress Test -  ECHO - 2017 Cardiac Cath - n/a  Sleep Study - 2017 CPAP - yes- with O2- 3liters, q night   Fasting Blood Sugar - n/a Checks Blood Sugar _____ times a day  Blood Thinner Instructions:n/a- but on blood thinner at one time- for PE, first Coumadin & then Eliquis Aspirin Instructions:on hold per pt.   Anesthesia review: yes, overview of  Previous surgery/anesth. Experience.   Conversation with pt. , nurse & Oley Balm, regarding generous beard & pt. Agrees to remove or at least reduce .   Patient denies shortness of breath, fever, cough and chest pain at PAT appointment   Patient verbalized understanding of instructions that were given to them at the PAT appointment. Patient was also instructed that they will need to review over the PAT instructions again at home before surgery.

## 2019-01-28 ENCOUNTER — Encounter (HOSPITAL_COMMUNITY): Payer: Self-pay | Admitting: Vascular Surgery

## 2019-01-28 MED ORDER — DEXTROSE 5 % IV SOLN
3.0000 g | INTRAVENOUS | Status: DC
  Filled 2019-01-28: qty 3000
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
## 2019-01-31 ENCOUNTER — Encounter (HOSPITAL_COMMUNITY): Admission: RE | Payer: Self-pay | Source: Home / Self Care

## 2019-01-31 ENCOUNTER — Inpatient Hospital Stay (HOSPITAL_COMMUNITY): Admission: RE | Admit: 2019-01-31 | Payer: Medicare PPO | Source: Home / Self Care | Admitting: Orthopedic Surgery

## 2019-01-31 SURGERY — ANTERIOR LUMBAR FUSION 2 LEVELS
Anesthesia: General

## 2019-02-01 ENCOUNTER — Encounter (HOSPITAL_COMMUNITY): Admission: RE | Payer: Self-pay | Source: Home / Self Care

## 2019-02-01 ENCOUNTER — Inpatient Hospital Stay (HOSPITAL_COMMUNITY): Admission: RE | Admit: 2019-02-01 | Payer: Medicare PPO | Source: Home / Self Care | Admitting: Orthopedic Surgery

## 2019-02-01 SURGERY — POSTERIOR LUMBAR FUSION 1 LEVEL
Anesthesia: General

## 2019-02-02 DIAGNOSIS — Z6828 Body mass index (BMI) 28.0-28.9, adult: Secondary | ICD-10-CM | POA: Diagnosis not present

## 2019-02-02 DIAGNOSIS — I2782 Chronic pulmonary embolism: Secondary | ICD-10-CM | POA: Diagnosis not present

## 2019-02-02 DIAGNOSIS — R748 Abnormal levels of other serum enzymes: Secondary | ICD-10-CM | POA: Diagnosis not present

## 2019-02-02 DIAGNOSIS — G4733 Obstructive sleep apnea (adult) (pediatric): Secondary | ICD-10-CM | POA: Diagnosis not present

## 2019-02-02 DIAGNOSIS — R269 Unspecified abnormalities of gait and mobility: Secondary | ICD-10-CM | POA: Diagnosis not present

## 2019-02-02 DIAGNOSIS — R740 Nonspecific elevation of levels of transaminase and lactic acid dehydrogenase [LDH]: Secondary | ICD-10-CM | POA: Diagnosis not present

## 2019-02-02 DIAGNOSIS — M4800 Spinal stenosis, site unspecified: Secondary | ICD-10-CM | POA: Diagnosis not present

## 2019-02-03 ENCOUNTER — Telehealth: Payer: Self-pay

## 2019-02-03 NOTE — Telephone Encounter (Signed)
Pt advised.

## 2019-02-03 NOTE — Telephone Encounter (Signed)
Pt is currently on the schedule for 02/11/19 12 pm baclofen pump refill appt.  I have contacted the pt and left a vm advising our office hours have temporally change to Monday- Thursday. I have moved the pt to 02/10/19 at 230 check in time of 2 pm. When pt calls back please advise on this change.

## 2019-02-10 ENCOUNTER — Other Ambulatory Visit: Payer: Self-pay

## 2019-02-10 ENCOUNTER — Encounter: Payer: Self-pay | Admitting: Neurology

## 2019-02-10 ENCOUNTER — Ambulatory Visit: Payer: Medicare PPO | Admitting: Neurology

## 2019-02-10 VITALS — BP 131/83 | HR 85 | Temp 97.8°F | Ht 72.0 in | Wt 225.0 lb

## 2019-02-10 DIAGNOSIS — G8250 Quadriplegia, unspecified: Secondary | ICD-10-CM | POA: Diagnosis not present

## 2019-02-10 MED ORDER — BACLOFEN 40 MG/20ML IT SOLN
40.0000 mg | Freq: Once | INTRATHECAL | Status: AC
  Administered 2019-02-10: 40 mg via INTRATHECAL

## 2019-02-10 NOTE — Procedures (Signed)
     History:  Ralph [name] is a 53 year old gentleman with history of spastic quadriparesis.  The patient has a baclofen pump in place, he has had some problems with decreased muscle tone of the legs, he requires a pump refill today and a reduction in the rate of the baclofen pump.  He has not had any falls.  Baclofen pump refill note  The baclofen pump site was cleaned with Betadine solution. A 21-gauge needle was inserted into the pump port site. Approximately 7 cc of residual baclofen was removed the baclofen pump indicates a 4.6 cc residual. 20 cc of replacement baclofen was placed into the pump at 2000 mcg/cc concentration.  The pump was reprogrammed for the following settings: Simple continuous rate of 210.0 mcg/day from a simple continuous rate of 230.1 mcg/day.  The alarm volume is set at 2.0 cc. The next alarm date is July 31, 2019.  The patient tolerated the procedure well. There were no complications of the above procedure.  The [place] number is [phone] The baclofen expiration date is January 2022. The baclofen lot number is [id number].

## 2019-02-11 ENCOUNTER — Ambulatory Visit: Payer: Self-pay | Admitting: Neurology

## 2019-02-11 DIAGNOSIS — G8250 Quadriplegia, unspecified: Secondary | ICD-10-CM | POA: Diagnosis not present

## 2019-02-11 DIAGNOSIS — M545 Low back pain: Secondary | ICD-10-CM | POA: Diagnosis not present

## 2019-02-11 DIAGNOSIS — R2689 Other abnormalities of gait and mobility: Secondary | ICD-10-CM | POA: Diagnosis not present

## 2019-02-11 DIAGNOSIS — Z20828 Contact with and (suspected) exposure to other viral communicable diseases: Secondary | ICD-10-CM | POA: Diagnosis not present

## 2019-02-11 DIAGNOSIS — M5410 Radiculopathy, site unspecified: Secondary | ICD-10-CM | POA: Diagnosis not present

## 2019-02-11 DIAGNOSIS — G629 Polyneuropathy, unspecified: Secondary | ICD-10-CM | POA: Diagnosis not present

## 2019-02-24 DIAGNOSIS — R748 Abnormal levels of other serum enzymes: Secondary | ICD-10-CM | POA: Diagnosis not present

## 2019-02-24 DIAGNOSIS — R945 Abnormal results of liver function studies: Secondary | ICD-10-CM | POA: Diagnosis not present

## 2019-03-04 DIAGNOSIS — R748 Abnormal levels of other serum enzymes: Secondary | ICD-10-CM | POA: Diagnosis not present

## 2019-03-11 ENCOUNTER — Encounter

## 2019-03-11 ENCOUNTER — Ambulatory Visit: Payer: Medicare PPO | Admitting: Neurology

## 2019-03-14 DIAGNOSIS — G8250 Quadriplegia, unspecified: Secondary | ICD-10-CM | POA: Diagnosis not present

## 2019-03-14 DIAGNOSIS — M5410 Radiculopathy, site unspecified: Secondary | ICD-10-CM | POA: Diagnosis not present

## 2019-03-14 DIAGNOSIS — G629 Polyneuropathy, unspecified: Secondary | ICD-10-CM | POA: Diagnosis not present

## 2019-03-14 DIAGNOSIS — R2689 Other abnormalities of gait and mobility: Secondary | ICD-10-CM | POA: Diagnosis not present

## 2019-03-14 DIAGNOSIS — M545 Low back pain: Secondary | ICD-10-CM | POA: Diagnosis not present

## 2019-03-17 ENCOUNTER — Encounter: Payer: Self-pay | Admitting: Gastroenterology

## 2019-03-17 ENCOUNTER — Ambulatory Visit (INDEPENDENT_AMBULATORY_CARE_PROVIDER_SITE_OTHER): Payer: Medicare PPO | Admitting: Gastroenterology

## 2019-03-17 VITALS — BP 98/62 | HR 76 | Temp 97.4°F | Ht 72.0 in | Wt 231.4 lb

## 2019-03-17 DIAGNOSIS — R740 Nonspecific elevation of levels of transaminase and lactic acid dehydrogenase [LDH]: Secondary | ICD-10-CM

## 2019-03-17 DIAGNOSIS — R7401 Elevation of levels of liver transaminase levels: Secondary | ICD-10-CM

## 2019-03-17 NOTE — Patient Instructions (Signed)
If you are age 52 or older, your body mass index should be between 23-30. Your Body mass index is 31.38 kg/m. If this is out of the aforementioned range listed, please consider follow up with your Primary Care Provider.  If you are age 64 or younger, your body mass index should be between 19-25. Your Body mass index is 31.38 kg/m. If this is out of the aformentioned range listed, please consider follow up with your Primary Care Provider.   It was a pleasure to see you today!  Dr. Danis  
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
## 2019-03-23 ENCOUNTER — Other Ambulatory Visit: Payer: Self-pay | Admitting: *Deleted

## 2019-03-24 ENCOUNTER — Ambulatory Visit (INDEPENDENT_AMBULATORY_CARE_PROVIDER_SITE_OTHER): Payer: Medicare PPO | Admitting: Adult Health

## 2019-03-24 ENCOUNTER — Other Ambulatory Visit: Payer: Self-pay

## 2019-03-24 ENCOUNTER — Encounter: Payer: Self-pay | Admitting: Adult Health

## 2019-03-24 VITALS — BP 108/71 | HR 62 | Temp 97.7°F | Ht 73.0 in | Wt 223.0 lb

## 2019-03-24 DIAGNOSIS — G4733 Obstructive sleep apnea (adult) (pediatric): Secondary | ICD-10-CM | POA: Diagnosis not present

## 2019-03-24 DIAGNOSIS — Z9989 Dependence on other enabling machines and devices: Secondary | ICD-10-CM
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
## 2019-03-24 NOTE — Patient Instructions (Signed)

## 2019-03-28 ENCOUNTER — Other Ambulatory Visit: Payer: Self-pay | Admitting: Orthopedic Surgery

## 2019-03-29 DIAGNOSIS — H52223 Regular astigmatism, bilateral: Secondary | ICD-10-CM | POA: Diagnosis not present

## 2019-03-29 DIAGNOSIS — H40023 Open angle with borderline findings, high risk, bilateral: Secondary | ICD-10-CM | POA: Diagnosis not present

## 2019-03-30 DIAGNOSIS — Z6828 Body mass index (BMI) 28.0-28.9, adult: Secondary | ICD-10-CM | POA: Diagnosis not present

## 2019-03-30 DIAGNOSIS — R7989 Other specified abnormal findings of blood chemistry: Secondary | ICD-10-CM | POA: Diagnosis not present

## 2019-03-30 DIAGNOSIS — M109 Gout, unspecified: Secondary | ICD-10-CM | POA: Diagnosis not present

## 2019-04-08 NOTE — Pre-Procedure Instructions (Signed)
Ralph [name]  04/08/2019      [facility] [place] - [address] [place] [id number] Phone: [phone] Fax: [phone]  [id number] [address] - [address] [facility] [address] [place] [address] Phone: [phone] Fax: [phone]    Your procedure is scheduled on 04/13/19.  [facility] at [facility].M.  Call this number if you have problems the morning of surgery:  [phone]   Remember:  Do not eat or drink after midnight.  You may drink clear liquids until 530am .  Clear liquids allowed are:                    Water, Carbonated beverages, Clear Tea and Black Coffee only    Take these medicines the morning of surgery with A SIP OF WATER ----ALLOPURINOL,NORVASC,CLONDINE,NEURONTIN,VICODIN,METOPROLOL,PRILOSEC    Do not wear jewelry, make-up or nail polish.  Do not wear lotions, powders, or perfumes, or deodorant.  Do not shave 48 hours prior to surgery.  Men may shave face and neck.  Do not bring valuables to the hospital.  [facility] is [facility] any belongings or valuables.  Contacts, dentures or bridgework may not be worn into surgery.  Leave your suitcase in the car.  After surgery it may be brought to your room.  For patients admitted to the hospital, discharge time will be determined by your treatment team.  Patients discharged the day of surgery will not be allowed to drive home.   Do not take any aspirin,anti-inflammatories,vitamins,or herbal supplements 5-7 days prior to surgery. Special instructions:  Please complete your PRE-SURGERY ENSURE that was provided to you by ... the morning of surgery.  Please, if able, drink it in one setting. DO NOT SIP.[place] - Preparing for Surgery  Before surgery, you can play an important role.  Because skin is not sterile, your skin needs to be as free of germs as possible.  You  can reduce the number of germs on you skin by washing with CHG (chlorahexidine gluconate) soap before surgery.  CHG is an antiseptic cleaner which kills germs and bonds with the skin to continue killing germs even after washing.  Oral Hygiene is also important in reducing the risk of infection.  Remember to brush your teeth with your regular toothpaste the morning of surgery.  Please DO NOT use if you have an allergy to CHG or antibacterial soaps.  If your skin becomes reddened/irritated stop using the CHG and inform your nurse when you arrive at Short Stay.  Do not shave (including legs and underarms) for at least 48 hours prior to the first CHG shower.  You may shave your face.  Please follow these instructions carefully:   1.  Shower with CHG Soap the night before surgery and the morning of Surgery.  2.  If you choose to wash your hair, wash your hair first as usual with your normal shampoo.  3.  After you shampoo, rinse your hair and body thoroughly to remove the shampoo. 4.  Use CHG as you would any other liquid soap.  You can apply chg directly to the skin and wash gently with a      scrungie or washcloth.           5.  Apply the CHG Soap to your body ONLY FROM THE NECK DOWN.   Do  not use on open wounds or open sores. Avoid contact with your eyes, ears, mouth and genitals (private parts).  Wash genitals (private parts) with your normal soap.  6.  Wash thoroughly, paying special attention to the area where your surgery will be performed.  7.  Thoroughly rinse your body with warm water from the neck down.  8.  DO NOT shower/wash with your normal soap after using and rinsing off the CHG Soap.  9.  Pat yourself dry with a clean towel.            10.  Wear clean pajamas.            11.  Place clean sheets on your bed the night of your first shower and do not sleep with pets.  Day of Surgery  Do not apply any lotions/deoderants the morning of surgery.   Please wear clean clothes to the  hospital/surgery center. Remember to brush your teeth with toothpaste.    Please read over the following fact sheets that you were given. MRSA Information

## 2019-04-12 ENCOUNTER — Other Ambulatory Visit: Payer: Self-pay

## 2019-04-12 ENCOUNTER — Other Ambulatory Visit (HOSPITAL_COMMUNITY)
Admission: RE | Admit: 2019-04-12 | Discharge: 2019-04-12 | Disposition: A | Discharge status: Home / Self Care | Payer: Medicare PPO | Source: Ambulatory Visit | Attending: Orthopedic Surgery | Admitting: Orthopedic Surgery

## 2019-04-12 ENCOUNTER — Encounter (HOSPITAL_COMMUNITY)
Admission: RE | Admit: 2019-04-12 | Discharge: 2019-04-12 | Disposition: A | Discharge status: Home / Self Care | Payer: Medicare PPO | Source: Ambulatory Visit | Attending: Orthopedic Surgery | Admitting: Orthopedic Surgery

## 2019-04-12 ENCOUNTER — Other Ambulatory Visit (HOSPITAL_COMMUNITY): Payer: Medicare PPO

## 2019-04-12 DIAGNOSIS — G629 Polyneuropathy, unspecified: Secondary | ICD-10-CM | POA: Diagnosis not present

## 2019-04-12 DIAGNOSIS — Z86010 Personal history of colonic polyps: Secondary | ICD-10-CM | POA: Diagnosis not present

## 2019-04-12 DIAGNOSIS — G4700 Insomnia, unspecified: Secondary | ICD-10-CM | POA: Diagnosis present

## 2019-04-12 DIAGNOSIS — M5417 Radiculopathy, lumbosacral region: Secondary | ICD-10-CM | POA: Diagnosis present

## 2019-04-12 DIAGNOSIS — G8929 Other chronic pain: Secondary | ICD-10-CM | POA: Diagnosis present

## 2019-04-12 DIAGNOSIS — M48062 Spinal stenosis, lumbar region with neurogenic claudication: Secondary | ICD-10-CM | POA: Diagnosis not present

## 2019-04-12 DIAGNOSIS — R2689 Other abnormalities of gait and mobility: Secondary | ICD-10-CM | POA: Diagnosis not present

## 2019-04-12 DIAGNOSIS — Z79899 Other long term (current) drug therapy: Secondary | ICD-10-CM | POA: Diagnosis not present

## 2019-04-12 DIAGNOSIS — I739 Peripheral vascular disease, unspecified: Secondary | ICD-10-CM | POA: Diagnosis present

## 2019-04-12 DIAGNOSIS — Z9989 Dependence on other enabling machines and devices: Secondary | ICD-10-CM | POA: Diagnosis not present

## 2019-04-12 DIAGNOSIS — Z9049 Acquired absence of other specified parts of digestive tract: Secondary | ICD-10-CM | POA: Diagnosis not present

## 2019-04-12 DIAGNOSIS — G2581 Restless legs syndrome: Secondary | ICD-10-CM | POA: Diagnosis present

## 2019-04-12 DIAGNOSIS — M5187 Other intervertebral disc disorders, lumbosacral region: Secondary | ICD-10-CM | POA: Diagnosis not present

## 2019-04-12 DIAGNOSIS — M62838 Other muscle spasm: Secondary | ICD-10-CM | POA: Diagnosis not present

## 2019-04-12 DIAGNOSIS — Z20828 Contact with and (suspected) exposure to other viral communicable diseases: Secondary | ICD-10-CM | POA: Diagnosis present

## 2019-04-12 DIAGNOSIS — M48061 Spinal stenosis, lumbar region without neurogenic claudication: Secondary | ICD-10-CM | POA: Diagnosis present

## 2019-04-12 DIAGNOSIS — M5136 Other intervertebral disc degeneration, lumbar region: Secondary | ICD-10-CM | POA: Diagnosis not present

## 2019-04-12 DIAGNOSIS — Z7982 Long term (current) use of aspirin: Secondary | ICD-10-CM | POA: Diagnosis not present

## 2019-04-12 DIAGNOSIS — M5410 Radiculopathy, site unspecified: Secondary | ICD-10-CM | POA: Diagnosis not present

## 2019-04-12 DIAGNOSIS — K219 Gastro-esophageal reflux disease without esophagitis: Secondary | ICD-10-CM | POA: Diagnosis present

## 2019-04-12 DIAGNOSIS — M545 Low back pain: Secondary | ICD-10-CM | POA: Diagnosis not present

## 2019-04-12 DIAGNOSIS — Z981 Arthrodesis status: Secondary | ICD-10-CM | POA: Diagnosis not present

## 2019-04-12 DIAGNOSIS — G4733 Obstructive sleep apnea (adult) (pediatric): Secondary | ICD-10-CM | POA: Diagnosis present

## 2019-04-12 DIAGNOSIS — M5137 Other intervertebral disc degeneration, lumbosacral region: Secondary | ICD-10-CM | POA: Diagnosis present

## 2019-04-12 DIAGNOSIS — I10 Essential (primary) hypertension: Secondary | ICD-10-CM | POA: Diagnosis present

## 2019-04-12 DIAGNOSIS — M4327 Fusion of spine, lumbosacral region: Secondary | ICD-10-CM | POA: Diagnosis not present

## 2019-04-12 DIAGNOSIS — Z8673 Personal history of transient ischemic attack (TIA), and cerebral infarction without residual deficits: Secondary | ICD-10-CM | POA: Diagnosis not present

## 2019-04-12 DIAGNOSIS — G8250 Quadriplegia, unspecified: Secondary | ICD-10-CM | POA: Diagnosis present

## 2019-04-12 DIAGNOSIS — M109 Gout, unspecified: Secondary | ICD-10-CM | POA: Diagnosis present

## 2019-04-12 LAB — CBC WITH DIFFERENTIAL/PLATELET
Abs Immature Granulocytes: 0.01 10*3/uL (ref 0.00–0.07)
Basophils Absolute: 0 10*3/uL (ref 0.0–0.1)
Basophils Relative: 1 %
Eosinophils Absolute: 0.1 10*3/uL (ref 0.0–0.5)
Eosinophils Relative: 2 %
HCT: 44.2 % (ref 39.0–52.0)
Hemoglobin: 14.8 g/dL (ref 13.0–17.0)
Immature Granulocytes: 0 %
Lymphocytes Relative: 20 %
Lymphs Abs: 1.1 10*3/uL (ref 0.7–4.0)
MCH: 31.3 pg (ref 26.0–34.0)
MCHC: 33.5 g/dL (ref 30.0–36.0)
MCV: 93.4 fL (ref 80.0–100.0)
Monocytes Absolute: 0.5 10*3/uL (ref 0.1–1.0)
Monocytes Relative: 10 %
Neutro Abs: 3.6 10*3/uL (ref 1.7–7.7)
Neutrophils Relative %: 67 %
Platelets: 166 10*3/uL (ref 150–400)
RBC: 4.73 MIL/uL (ref 4.22–5.81)
RDW: 12.5 % (ref 11.5–15.5)
WBC: 5.3 10*3/uL (ref 4.0–10.5)
nRBC: 0 % (ref 0.0–0.2)

## 2019-04-12 LAB — COMPREHENSIVE METABOLIC PANEL
ALT: 29 U/L (ref 0–44)
AST: 26 U/L (ref 15–41)
Albumin: 3.9 g/dL (ref 3.5–5.0)
Alkaline Phosphatase: 104 U/L (ref 38–126)
Anion gap: 8 (ref 5–15)
BUN: 9 mg/dL (ref 6–20)
CO2: 26 mmol/L (ref 22–32)
Calcium: 9.1 mg/dL (ref 8.9–10.3)
Chloride: 105 mmol/L (ref 98–111)
Creatinine, Ser: 0.93 mg/dL (ref 0.61–1.24)
GFR calc Af Amer: >60 mL/min (ref >60)
GFR calc non Af Amer: >60 mL/min (ref >60)
Glucose, Bld: 128 mg/dL — ABNORMAL HIGH (ref 70–99)
Potassium: 3.8 mmol/L (ref 3.5–5.1)
Sodium: 139 mmol/L (ref 135–145)
Total Bilirubin: 0.6 mg/dL (ref 0.3–1.2)
Total Protein: 6.5 g/dL (ref 6.5–8.1)

## 2019-04-12 LAB — TYPE AND SCREEN
ABO/RH(D): O POS
Antibody Screen: NEGATIVE

## 2019-04-12 LAB — SARS CORONAVIRUS 2 (TAT 6-24 HRS): SARS Coronavirus 2: NEGATIVE

## 2019-04-12 LAB — PROTIME-INR
INR: 1.1 (ref 0.8–1.2)
Prothrombin Time: 13.6 seconds (ref 11.4–15.2)

## 2019-04-12 LAB — SURGICAL PCR SCREEN
MRSA, PCR: NEGATIVE
Staphylococcus aureus: POSITIVE — AB

## 2019-04-12 LAB — APTT: aPTT: 27 seconds (ref 24–36)

## 2019-04-12 MED ORDER — CHLORHEXIDINE GLUCONATE 4 % EX LIQD: 60.0000 mL | Freq: Once | CUTANEOUS | Status: DC

## 2019-04-12 MED ORDER — CEFAZOLIN SODIUM-DEXTROSE 2-4 GM/100ML-% IV SOLN
2.0000 g | INTRAVENOUS | Status: AC
  Administered 2019-04-13: 2 g via INTRAVENOUS
  Filled 2019-04-12: qty 100
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
## 2019-04-12 NOTE — Anesthesia Preprocedure Evaluation (Addendum)
Anesthesia Evaluation  Patient identified by MRN, date of birth, ID band Patient awake    Reviewed: Allergy & Precautions, NPO status , Patient's Chart, lab work & pertinent test results  Airway Mallampati: II  TM Distance: >3 FB Neck ROM: Limited    Dental no notable dental hx.    Pulmonary sleep apnea and Oxygen sleep apnea ,    Pulmonary exam normal breath sounds clear to auscultation       Cardiovascular hypertension, Normal cardiovascular exam Rhythm:Regular Rate:Normal     Neuro/Psych Spastic quadriplegia  Neuromuscular disease CVA negative psych ROS   GI/Hepatic negative GI ROS, Neg liver ROS,   Endo/Other  negative endocrine ROS  Renal/GU negative Renal ROS  negative genitourinary   Musculoskeletal negative musculoskeletal ROS (+)   Abdominal   Peds negative pediatric ROS (+)  Hematology negative hematology ROS (+)   Anesthesia Other Findings   Reproductive/Obstetrics negative OB ROS                            Anesthesia Physical Anesthesia Plan  ASA: IV  Anesthesia Plan: General   Post-op Pain Management:    Induction: Intravenous  PONV Risk Score and Plan: 2 and Ondansetron, Dexamethasone and Treatment may vary due to age or medical condition  Airway Management Planned: Oral ETT and Video Laryngoscope Planned  Additional Equipment:   Intra-op Plan:   Post-operative Plan: Extubation in OR  Informed Consent: I have reviewed the patients History and Physical, chart, labs and discussed the procedure including the risks, benefits and alternatives for the proposed anesthesia with the patient or authorized representative who has indicated his/her understanding and acceptance.     Dental advisory given  Plan Discussed with: CRNA and Surgeon  Anesthesia Plan Comments: (PAT note written 04/12/2019 by Myra Gianotti, PA-C. )       Anesthesia Quick Evaluation

## 2019-04-12 NOTE — Progress Notes (Signed)
PCP - DR Helene Kelp AT [place] OF SURGERY Cardiologist - NA  Chest x-ray - NA EKG - 6/20 Stress  NA   ECHO - 6/18 Cardiac Cath - NA  Sleep Study YES-  CPAP - YES   -  C   Aspirin Instructions:STOP  Anesthesia review: PT WAS CANCELLED DUE TO ELEVATED AST,ALT.   HAS BEEN RECHECKED BY PCP AND TODAY ALSO  Patient denies shortness of breath, fever, cough and chest pain at PAT appointment   Patient verbalized understanding of instructions that were given to them at the PAT appointment. Patient was also instructed that they will need to review over the PAT instructions again at home before surgery.

## 2019-04-13 ENCOUNTER — Inpatient Hospital Stay (HOSPITAL_COMMUNITY): Payer: Medicare PPO | Admitting: Certified Registered Nurse Anesthetist

## 2019-04-13 ENCOUNTER — Inpatient Hospital Stay (HOSPITAL_COMMUNITY): Payer: Medicare PPO

## 2019-04-13 ENCOUNTER — Encounter (HOSPITAL_COMMUNITY)
Admission: RE | Disposition: A | Discharge status: Home / Self Care | Payer: Self-pay | Source: Home / Self Care | Attending: Orthopedic Surgery

## 2019-04-13 ENCOUNTER — Inpatient Hospital Stay (HOSPITAL_COMMUNITY)
Admission: RE | Admit: 2019-04-13 | Discharge: 2019-04-15 | DRG: 453 | Disposition: A | Discharge status: Home / Self Care | Payer: Medicare PPO | Attending: Orthopedic Surgery | Admitting: Orthopedic Surgery

## 2019-04-13 DIAGNOSIS — G8250 Quadriplegia, unspecified: Secondary | ICD-10-CM | POA: Diagnosis present

## 2019-04-13 DIAGNOSIS — G4733 Obstructive sleep apnea (adult) (pediatric): Secondary | ICD-10-CM | POA: Diagnosis present

## 2019-04-13 DIAGNOSIS — M109 Gout, unspecified: Secondary | ICD-10-CM | POA: Diagnosis present

## 2019-04-13 DIAGNOSIS — Z8673 Personal history of transient ischemic attack (TIA), and cerebral infarction without residual deficits: Secondary | ICD-10-CM | POA: Diagnosis not present

## 2019-04-13 DIAGNOSIS — M5137 Other intervertebral disc degeneration, lumbosacral region: Secondary | ICD-10-CM | POA: Diagnosis present

## 2019-04-13 DIAGNOSIS — I10 Essential (primary) hypertension: Secondary | ICD-10-CM | POA: Diagnosis present

## 2019-04-13 DIAGNOSIS — G4700 Insomnia, unspecified: Secondary | ICD-10-CM | POA: Diagnosis present

## 2019-04-13 DIAGNOSIS — Z86010 Personal history of colonic polyps: Secondary | ICD-10-CM

## 2019-04-13 DIAGNOSIS — Z20828 Contact with and (suspected) exposure to other viral communicable diseases: Secondary | ICD-10-CM | POA: Diagnosis present

## 2019-04-13 DIAGNOSIS — Z981 Arthrodesis status: Secondary | ICD-10-CM | POA: Diagnosis not present

## 2019-04-13 DIAGNOSIS — M4327 Fusion of spine, lumbosacral region: Secondary | ICD-10-CM | POA: Diagnosis not present

## 2019-04-13 DIAGNOSIS — G8929 Other chronic pain: Secondary | ICD-10-CM | POA: Diagnosis present

## 2019-04-13 DIAGNOSIS — Z79899 Other long term (current) drug therapy: Secondary | ICD-10-CM | POA: Diagnosis not present

## 2019-04-13 DIAGNOSIS — I739 Peripheral vascular disease, unspecified: Secondary | ICD-10-CM | POA: Diagnosis present

## 2019-04-13 DIAGNOSIS — G2581 Restless legs syndrome: Secondary | ICD-10-CM | POA: Diagnosis present

## 2019-04-13 DIAGNOSIS — M62838 Other muscle spasm: Secondary | ICD-10-CM | POA: Diagnosis not present

## 2019-04-13 DIAGNOSIS — M5417 Radiculopathy, lumbosacral region: Secondary | ICD-10-CM | POA: Diagnosis present

## 2019-04-13 DIAGNOSIS — Z7982 Long term (current) use of aspirin: Secondary | ICD-10-CM

## 2019-04-13 DIAGNOSIS — M48061 Spinal stenosis, lumbar region without neurogenic claudication: Secondary | ICD-10-CM | POA: Diagnosis present

## 2019-04-13 DIAGNOSIS — Z9049 Acquired absence of other specified parts of digestive tract: Secondary | ICD-10-CM | POA: Diagnosis not present

## 2019-04-13 DIAGNOSIS — K219 Gastro-esophageal reflux disease without esophagitis: Secondary | ICD-10-CM | POA: Diagnosis present

## 2019-04-13 DIAGNOSIS — Z419 Encounter for procedure for purposes other than remedying health state, unspecified: Secondary | ICD-10-CM

## 2019-04-13 DIAGNOSIS — M5410 Radiculopathy, site unspecified: Secondary | ICD-10-CM | POA: Diagnosis present

## 2019-04-13 HISTORY — PX: ABDOMINAL EXPOSURE: SHX5708

## 2019-04-13 HISTORY — PX: ANTERIOR LUMBAR FUSION: SHX1170

## 2019-04-13 LAB — URINALYSIS, ROUTINE W REFLEX MICROSCOPIC
Bilirubin Urine: NEGATIVE
Glucose, UA: NEGATIVE mg/dL
Hgb urine dipstick: NEGATIVE
Ketones, ur: NEGATIVE mg/dL
Leukocytes,Ua: NEGATIVE
Nitrite: NEGATIVE
Protein, ur: NEGATIVE mg/dL
Specific Gravity, Urine: 1.023 (ref 1.005–1.030)
pH: 5 (ref 5.0–8.0)

## 2019-04-13 SURGERY — ANTERIOR LUMBAR FUSION 2 LEVELS
Anesthesia: General

## 2019-04-13 MED ORDER — DIAZEPAM 5 MG PO TABS
ORAL_TABLET | ORAL | Status: AC
  Filled 2019-04-13: qty 1

## 2019-04-13 MED ORDER — THROMBIN 20000 UNITS EX SOLR
CUTANEOUS | Status: DC | PRN
  Administered 2019-04-13: 20000 [IU] via TOPICAL

## 2019-04-13 MED ORDER — ALLOPURINOL 300 MG PO TABS
300.0000 mg | ORAL_TABLET | ORAL | Status: DC
  Administered 2019-04-14: 300 mg via ORAL
  Filled 2019-04-13: qty 1

## 2019-04-13 MED ORDER — THROMBIN 20000 UNITS EX SOLR
CUTANEOUS | Status: AC
  Filled 2019-04-13: qty 20000

## 2019-04-13 MED ORDER — SODIUM CHLORIDE 0.9 % IV SOLN: 250.0000 mL | INTRAVENOUS | Status: DC

## 2019-04-13 MED ORDER — FENTANYL CITRATE (PF) 250 MCG/5ML IJ SOLN
INTRAMUSCULAR | Status: AC
  Filled 2019-04-13: qty 5

## 2019-04-13 MED ORDER — PROPOFOL 10 MG/ML IV BOLUS
INTRAVENOUS | Status: AC
  Filled 2019-04-13: qty 20

## 2019-04-13 MED ORDER — ONDANSETRON HCL 4 MG PO TABS: 4.0000 mg | ORAL_TABLET | Freq: Four times a day (QID) | ORAL | Status: DC | PRN

## 2019-04-13 MED ORDER — ACETYL L-CARNITINE 250 MG PO CAPS: ORAL_CAPSULE | Freq: Every day | ORAL | Status: DC

## 2019-04-13 MED ORDER — BUPIVACAINE-EPINEPHRINE (PF) 0.25% -1:200000 IJ SOLN
INTRAMUSCULAR | Status: DC | PRN
  Administered 2019-04-13: 9 mL

## 2019-04-13 MED ORDER — DIAZEPAM 5 MG PO TABS
5.0000 mg | ORAL_TABLET | Freq: Every day | ORAL | Status: DC
  Administered 2019-04-13 – 2019-04-14 (×2): 5 mg via ORAL
  Filled 2019-04-13 (×2): qty 1

## 2019-04-13 MED ORDER — HEMOSTATIC AGENTS (NO CHARGE) OPTIME
TOPICAL | Status: DC | PRN
  Administered 2019-04-13: 1 via TOPICAL

## 2019-04-13 MED ORDER — CEFAZOLIN SODIUM-DEXTROSE 2-4 GM/100ML-% IV SOLN
2.0000 g | Freq: Three times a day (TID) | INTRAVENOUS | Status: AC
  Administered 2019-04-13 – 2019-04-14 (×2): 2 g via INTRAVENOUS
  Filled 2019-04-13 (×2): qty 100

## 2019-04-13 MED ORDER — MENTHOL 3 MG MT LOZG: 1.0000 | LOZENGE | OROMUCOSAL | Status: DC | PRN

## 2019-04-13 MED ORDER — OXYCODONE HCL 5 MG PO TABS: 5.0000 mg | ORAL_TABLET | Freq: Once | ORAL | Status: DC | PRN

## 2019-04-13 MED ORDER — MELATONIN 3 MG PO TABS
9.0000 mg | ORAL_TABLET | Freq: Every day | ORAL | Status: DC
  Administered 2019-04-13 – 2019-04-14 (×2): 9 mg via ORAL
  Filled 2019-04-13 (×3): qty 3

## 2019-04-13 MED ORDER — ZOLPIDEM TARTRATE 5 MG PO TABS: 5.0000 mg | ORAL_TABLET | Freq: Every evening | ORAL | Status: DC | PRN

## 2019-04-13 MED ORDER — CEFAZOLIN SODIUM-DEXTROSE 2-4 GM/100ML-% IV SOLN: 2.0000 g | INTRAVENOUS | Status: DC

## 2019-04-13 MED ORDER — DOCUSATE SODIUM 100 MG PO CAPS
100.0000 mg | ORAL_CAPSULE | Freq: Two times a day (BID) | ORAL | Status: DC
  Administered 2019-04-13 – 2019-04-15 (×3): 100 mg via ORAL
  Filled 2019-04-13 (×3): qty 1

## 2019-04-13 MED ORDER — PHENOL 1.4 % MT LIQD: 1.0000 | OROMUCOSAL | Status: DC | PRN

## 2019-04-13 MED ORDER — BISACODYL 5 MG PO TBEC: 5.0000 mg | DELAYED_RELEASE_TABLET | Freq: Every day | ORAL | Status: DC | PRN

## 2019-04-13 MED ORDER — MIDAZOLAM HCL 2 MG/2ML IJ SOLN
INTRAMUSCULAR | Status: DC | PRN
  Administered 2019-04-13: 2 mg via INTRAVENOUS

## 2019-04-13 MED ORDER — METOPROLOL SUCCINATE ER 100 MG PO TB24
200.0000 mg | ORAL_TABLET | Freq: Every day | ORAL | Status: DC
  Administered 2019-04-14: 05:00:00 200 mg via ORAL
  Filled 2019-04-13: qty 2

## 2019-04-13 MED ORDER — LACTATED RINGERS IV SOLN
INTRAVENOUS | Status: DC | PRN
  Administered 2019-04-13: 08:00:00 via INTRAVENOUS

## 2019-04-13 MED ORDER — ONDANSETRON HCL 4 MG/2ML IJ SOLN
INTRAMUSCULAR | Status: DC | PRN
  Administered 2019-04-13: 4 mg via INTRAVENOUS

## 2019-04-13 MED ORDER — BUPIVACAINE-EPINEPHRINE (PF) 0.25% -1:200000 IJ SOLN
INTRAMUSCULAR | Status: AC
  Filled 2019-04-13: qty 30

## 2019-04-13 MED ORDER — LACTATED RINGERS IV BOLUS
300.0000 mL | Freq: Once | INTRAVENOUS | Status: AC
  Administered 2019-04-13: 12:00:00 300 mL via INTRAVENOUS

## 2019-04-13 MED ORDER — PROPOFOL 500 MG/50ML IV EMUL
INTRAVENOUS | Status: DC | PRN
  Administered 2019-04-13: 15 ug/kg/min via INTRAVENOUS

## 2019-04-13 MED ORDER — MORPHINE SULFATE (PF) 2 MG/ML IV SOLN: 1.0000 mg | INTRAVENOUS | Status: DC | PRN

## 2019-04-13 MED ORDER — FUROSEMIDE 40 MG PO TABS
40.0000 mg | ORAL_TABLET | Freq: Every day | ORAL | Status: DC | PRN
  Administered 2019-04-14: 40 mg via ORAL
  Filled 2019-04-13: qty 1

## 2019-04-13 MED ORDER — PROPOFOL 10 MG/ML IV BOLUS
INTRAVENOUS | Status: DC | PRN
  Administered 2019-04-13: 150 mg via INTRAVENOUS

## 2019-04-13 MED ORDER — GABAPENTIN 400 MG PO CAPS
800.0000 mg | ORAL_CAPSULE | Freq: Three times a day (TID) | ORAL | Status: DC
  Administered 2019-04-13 – 2019-04-15 (×4): 800 mg via ORAL
  Filled 2019-04-13 (×4): qty 2

## 2019-04-13 MED ORDER — FENTANYL CITRATE (PF) 250 MCG/5ML IJ SOLN
INTRAMUSCULAR | Status: DC | PRN
  Administered 2019-04-13 (×5): 50 ug via INTRAVENOUS

## 2019-04-13 MED ORDER — DEXAMETHASONE SODIUM PHOSPHATE 10 MG/ML IJ SOLN
INTRAMUSCULAR | Status: DC | PRN
  Administered 2019-04-13: 10 mg via INTRAVENOUS

## 2019-04-13 MED ORDER — CEFAZOLIN SODIUM-DEXTROSE 2-4 GM/100ML-% IV SOLN
2.0000 g | INTRAVENOUS | Status: AC
  Administered 2019-04-14: 2 g via INTRAVENOUS
  Filled 2019-04-13: qty 100

## 2019-04-13 MED ORDER — HYDROMORPHONE HCL 1 MG/ML IJ SOLN
INTRAMUSCULAR | Status: AC
  Filled 2019-04-13: qty 1

## 2019-04-13 MED ORDER — OXYCODONE HCL 5 MG/5ML PO SOLN: 5.0000 mg | Freq: Once | ORAL | Status: DC | PRN

## 2019-04-13 MED ORDER — MIDAZOLAM HCL 2 MG/2ML IJ SOLN
INTRAMUSCULAR | Status: AC
  Filled 2019-04-13: qty 2

## 2019-04-13 MED ORDER — DIAZEPAM 5 MG PO TABS
5.0000 mg | ORAL_TABLET | Freq: Four times a day (QID) | ORAL | Status: DC | PRN
  Administered 2019-04-13 – 2019-04-14 (×2): 5 mg via ORAL
  Filled 2019-04-13: qty 1

## 2019-04-13 MED ORDER — PROMETHAZINE HCL 25 MG/ML IJ SOLN: 6.2500 mg | INTRAMUSCULAR | Status: DC | PRN

## 2019-04-13 MED ORDER — HYDROMORPHONE HCL 1 MG/ML IJ SOLN: 0.2500 mg | INTRAMUSCULAR | Status: DC | PRN

## 2019-04-13 MED ORDER — LIDOCAINE 2% (20 MG/ML) 5 ML SYRINGE
INTRAMUSCULAR | Status: DC | PRN
  Administered 2019-04-13: 100 mg via INTRAVENOUS

## 2019-04-13 MED ORDER — GLYCOPYRROLATE PF 0.2 MG/ML IJ SOSY
PREFILLED_SYRINGE | INTRAMUSCULAR | Status: DC | PRN
  Administered 2019-04-13: .2 mg via INTRAVENOUS

## 2019-04-13 MED ORDER — AMLODIPINE BESYLATE 5 MG PO TABS
10.0000 mg | ORAL_TABLET | Freq: Every day | ORAL | Status: DC
  Filled 2019-04-13 (×2): qty 2

## 2019-04-13 MED ORDER — ACETAMINOPHEN 650 MG RE SUPP: 650.0000 mg | RECTAL | Status: DC | PRN

## 2019-04-13 MED ORDER — 0.9 % SODIUM CHLORIDE (POUR BTL) OPTIME
TOPICAL | Status: DC | PRN
  Administered 2019-04-13: 10:00:00 1000 mL

## 2019-04-13 MED ORDER — PANTOPRAZOLE SODIUM 40 MG PO TBEC
40.0000 mg | DELAYED_RELEASE_TABLET | Freq: Every day | ORAL | Status: DC
  Administered 2019-04-14 – 2019-04-15 (×2): 40 mg via ORAL
  Filled 2019-04-13 (×2): qty 1

## 2019-04-13 MED ORDER — CLONIDINE HCL 0.1 MG PO TABS
0.1000 mg | ORAL_TABLET | Freq: Every day | ORAL | Status: DC
  Administered 2019-04-13 – 2019-04-14 (×2): 0.1 mg via ORAL
  Filled 2019-04-13 (×2): qty 1

## 2019-04-13 MED ORDER — POTASSIUM CHLORIDE IN NACL 20-0.9 MEQ/L-% IV SOLN
INTRAVENOUS | Status: DC
  Administered 2019-04-13: 15:00:00 via INTRAVENOUS
  Filled 2019-04-13: qty 1000

## 2019-04-13 MED ORDER — ACETAMINOPHEN 325 MG PO TABS: 650.0000 mg | ORAL_TABLET | ORAL | Status: DC | PRN

## 2019-04-13 MED ORDER — ALUM & MAG HYDROXIDE-SIMETH 200-200-20 MG/5ML PO SUSP: 30.0000 mL | Freq: Four times a day (QID) | ORAL | Status: DC | PRN

## 2019-04-13 MED ORDER — SODIUM CHLORIDE 0.9% FLUSH
3.0000 mL | Freq: Two times a day (BID) | INTRAVENOUS | Status: DC
  Administered 2019-04-13 – 2019-04-14 (×3): 3 mL via INTRAVENOUS

## 2019-04-13 MED ORDER — NORTRIPTYLINE HCL 25 MG PO CAPS
150.0000 mg | ORAL_CAPSULE | Freq: Every day | ORAL | Status: DC
  Administered 2019-04-13 – 2019-04-14 (×2): 150 mg via ORAL
  Filled 2019-04-13 (×3): qty 6

## 2019-04-13 MED ORDER — OXYCODONE-ACETAMINOPHEN 5-325 MG PO TABS
1.0000 | ORAL_TABLET | ORAL | Status: DC | PRN
  Administered 2019-04-13 – 2019-04-15 (×7): 1 via ORAL
  Filled 2019-04-13 (×7): qty 1

## 2019-04-13 MED ORDER — POTASSIUM CHLORIDE ER 10 MEQ PO TBCR
20.0000 meq | EXTENDED_RELEASE_TABLET | Freq: Every day | ORAL | Status: DC | PRN
  Filled 2019-04-13: qty 2

## 2019-04-13 MED ORDER — ONDANSETRON HCL 4 MG/2ML IJ SOLN: 4.0000 mg | Freq: Four times a day (QID) | INTRAMUSCULAR | Status: DC | PRN

## 2019-04-13 MED ORDER — BUPIVACAINE-EPINEPHRINE (PF) 0.25% -1:200000 IJ SOLN
INTRAMUSCULAR | Status: AC
  Filled 2019-04-13: qty 10

## 2019-04-13 MED ORDER — SODIUM CHLORIDE 0.9% FLUSH: 3.0000 mL | INTRAVENOUS | Status: DC | PRN

## 2019-04-13 MED ORDER — ROCURONIUM BROMIDE 10 MG/ML (PF) SYRINGE
PREFILLED_SYRINGE | INTRAVENOUS | Status: DC | PRN
  Administered 2019-04-13: 50 mg via INTRAVENOUS

## 2019-04-13 MED ORDER — FLEET ENEMA 7-19 GM/118ML RE ENEM: 1.0000 | ENEMA | Freq: Once | RECTAL | Status: DC | PRN

## 2019-04-13 MED ORDER — SODIUM CHLORIDE 0.9 % IV SOLN
INTRAVENOUS | Status: DC | PRN
  Administered 2019-04-13: 09:00:00 25 ug/min via INTRAVENOUS

## 2019-04-13 MED ORDER — SENNOSIDES-DOCUSATE SODIUM 8.6-50 MG PO TABS
1.0000 | ORAL_TABLET | Freq: Every evening | ORAL | Status: DC | PRN
  Administered 2019-04-14: 1 via ORAL
  Filled 2019-04-13: qty 1

## 2019-04-13 MED ORDER — POVIDONE-IODINE 7.5 % EX SOLN
1.0000 "application " | Freq: Once | CUTANEOUS | Status: DC
  Filled 2019-04-13 (×2): qty 118

## 2019-04-13 MED ORDER — SUGAMMADEX SODIUM 200 MG/2ML IV SOLN
INTRAVENOUS | Status: DC | PRN
  Administered 2019-04-13: 200 mg via INTRAVENOUS

## 2019-04-13 SURGICAL SUPPLY — 102 items
ADH SKN CLS APL DERMABOND .7 (GAUZE/BANDAGES/DRESSINGS) ×1
AGENT HMST KT MTR STRL THRMB (HEMOSTASIS)
APL SKNCLS STERI-STRIP NONHPOA (GAUZE/BANDAGES/DRESSINGS) ×1
APPLIER CLIP 11 MED OPEN (CLIP) ×6
APR CLP MED 11 20 MLT OPN (CLIP) ×2
BENZOIN TINCTURE PRP APPL 2/3 (GAUZE/BANDAGES/DRESSINGS) ×2 IMPLANT
BLADE CLIPPER SURG (BLADE) IMPLANT
BLADE SURG 10 STRL SS (BLADE) ×3 IMPLANT
BONE VIVIGEN FORMABLE 10CC (Bone Implant) ×3 IMPLANT
CLIP APPLIE 11 MED OPEN (CLIP) ×2 IMPLANT
CLIP LIGATING EXTRA MED SLVR (CLIP) ×3 IMPLANT
CLIP LIGATING EXTRA SM BLUE (MISCELLANEOUS) ×3 IMPLANT
CLOSURE STERI-STRIP 1/2X4 (GAUZE/BANDAGES/DRESSINGS) ×1
CLOSURE WOUND 1/2 X4 (GAUZE/BANDAGES/DRESSINGS)
CLSR STERI-STRIP ANTIMIC 1/2X4 (GAUZE/BANDAGES/DRESSINGS) ×1 IMPLANT
CORD BIPOLAR FORCEPS 12FT (ELECTRODE) ×3 IMPLANT
COVER SURGICAL LIGHT HANDLE (MISCELLANEOUS) ×3 IMPLANT
COVER WAND RF STERILE (DRAPES) ×6 IMPLANT
DERMABOND ADVANCED (GAUZE/BANDAGES/DRESSINGS) ×2
DERMABOND ADVANCED .7 DNX12 (GAUZE/BANDAGES/DRESSINGS) ×1 IMPLANT
DRAPE C-ARM 42X72 X-RAY (DRAPES) ×6 IMPLANT
DRAPE INCISE IOBAN 66X45 STRL (DRAPES) ×2 IMPLANT
DRAPE POUCH INSTRU U-SHP 10X18 (DRAPES) ×3 IMPLANT
DRAPE SURG 17X23 STRL (DRAPES) ×9 IMPLANT
DRSG MEPILEX BORDER 4X12 (GAUZE/BANDAGES/DRESSINGS) ×3 IMPLANT
DURAPREP 26ML APPLICATOR (WOUND CARE) ×3 IMPLANT
ELECT BLADE 4.0 EZ CLEAN MEGAD (MISCELLANEOUS) ×6
ELECT CAUTERY BLADE 6.4 (BLADE) ×3 IMPLANT
ELECT REM PT RETURN 9FT ADLT (ELECTROSURGICAL) ×3
ELECTRODE BLDE 4.0 EZ CLN MEGD (MISCELLANEOUS) ×2 IMPLANT
ELECTRODE REM PT RTRN 9FT ADLT (ELECTROSURGICAL) ×1 IMPLANT
FEE INTRAOP MONITOR IMPULS NCS (MISCELLANEOUS) IMPLANT
GAUZE 4X4 16PLY RFD (DISPOSABLE) IMPLANT
GAUZE SPONGE 4X4 12PLY STRL (GAUZE/BANDAGES/DRESSINGS) ×2 IMPLANT
GLOVE BIO SURGEON STRL SZ7 (GLOVE) ×3 IMPLANT
GLOVE BIO SURGEON STRL SZ8 (GLOVE) ×3 IMPLANT
GLOVE BIOGEL PI IND STRL 7.0 (GLOVE) ×1 IMPLANT
GLOVE BIOGEL PI IND STRL 8 (GLOVE) ×2 IMPLANT
GLOVE BIOGEL PI INDICATOR 7.0 (GLOVE) ×2
GLOVE BIOGEL PI INDICATOR 8 (GLOVE) ×4
GLOVE SS BIOGEL STRL SZ 7.5 (GLOVE) ×1 IMPLANT
GLOVE SUPERSENSE BIOGEL SZ 7.5 (GLOVE) ×2
GOWN STRL REUS W/ TWL LRG LVL3 (GOWN DISPOSABLE) ×3 IMPLANT
GOWN STRL REUS W/ TWL XL LVL3 (GOWN DISPOSABLE) ×1 IMPLANT
GOWN STRL REUS W/TWL LRG LVL3 (GOWN DISPOSABLE) ×9
GOWN STRL REUS W/TWL XL LVL3 (GOWN DISPOSABLE) ×3
GRAFT BNE MATRIX VG FRMBL L 10 (Bone Implant) IMPLANT
HEMOSTAT SURGICEL 2X14 (HEMOSTASIS) IMPLANT
INSERT FOGARTY 61MM (MISCELLANEOUS) IMPLANT
INSERT FOGARTY SM (MISCELLANEOUS) IMPLANT
INTRAOP MONITOR FEE IMPULS NCS (MISCELLANEOUS) ×1
INTRAOP MONITOR FEE IMPULSE (MISCELLANEOUS) ×2
IV CATH 14GX2 1/4 (CATHETERS) ×2 IMPLANT
KIT BASIN OR (CUSTOM PROCEDURE TRAY) ×3 IMPLANT
KIT TURNOVER KIT B (KITS) ×3 IMPLANT
LOOP VESSEL MAXI BLUE (MISCELLANEOUS) IMPLANT
LOOP VESSEL MINI RED (MISCELLANEOUS) IMPLANT
NDL HYPO 25GX1X1/2 BEV (NEEDLE) ×1 IMPLANT
NDL SPNL 18GX3.5 QUINCKE PK (NEEDLE) ×1 IMPLANT
NEEDLE HYPO 25GX1X1/2 BEV (NEEDLE) ×3 IMPLANT
NEEDLE SPNL 18GX3.5 QUINCKE PK (NEEDLE) ×3 IMPLANT
NS IRRIG 1000ML POUR BTL (IV SOLUTION) ×3 IMPLANT
PACK LAMINECTOMY ORTHO (CUSTOM PROCEDURE TRAY) ×3 IMPLANT
PACK UNIVERSAL I (CUSTOM PROCEDURE TRAY) ×3 IMPLANT
PAD ARMBOARD 7.5X6 YLW CONV (MISCELLANEOUS) ×12 IMPLANT
PATTIES SURGICAL .5 X1 (DISPOSABLE) ×3 IMPLANT
SPONGE INTESTINAL PEANUT (DISPOSABLE) ×9 IMPLANT
SPONGE LAP 18X18 RF (DISPOSABLE) IMPLANT
SPONGE LAP 4X18 RFD (DISPOSABLE) IMPLANT
SPONGE SURGIFOAM ABS GEL 100 (HEMOSTASIS) ×4 IMPLANT
STAPLER VISISTAT 35W (STAPLE) IMPLANT
STRIP CLOSURE SKIN 1/2X4 (GAUZE/BANDAGES/DRESSINGS) IMPLANT
SURGIFLO W/THROMBIN 8M KIT (HEMOSTASIS) IMPLANT
SUT MNCRL AB 4-0 PS2 18 (SUTURE) ×3 IMPLANT
SUT PDS AB 1 CTX 36 (SUTURE) ×6 IMPLANT
SUT PROLENE 4 0 RB 1 (SUTURE)
SUT PROLENE 4-0 RB1 .5 CRCL 36 (SUTURE) IMPLANT
SUT PROLENE 5 0 C 1 24 (SUTURE) IMPLANT
SUT PROLENE 5 0 CC1 (SUTURE) IMPLANT
SUT PROLENE 6 0 C 1 30 (SUTURE) ×3 IMPLANT
SUT PROLENE 6 0 CC (SUTURE) IMPLANT
SUT SILK 0 TIES 10X30 (SUTURE) ×3 IMPLANT
SUT SILK 2 0 TIES 10X30 (SUTURE) ×6 IMPLANT
SUT SILK 2 0SH CR/8 30 (SUTURE) IMPLANT
SUT SILK 3 0 TIES 10X30 (SUTURE) ×6 IMPLANT
SUT SILK 3 0SH CR/8 30 (SUTURE) IMPLANT
SUT VIC AB 1 CT1 18XCR BRD 8 (SUTURE) IMPLANT
SUT VIC AB 1 CT1 27 (SUTURE) ×6
SUT VIC AB 1 CT1 27XBRD ANBCTR (SUTURE) ×2 IMPLANT
SUT VIC AB 1 CT1 8-18 (SUTURE) ×3
SUT VIC AB 1 CTX 36 (SUTURE) ×6
SUT VIC AB 1 CTX36XBRD ANBCTR (SUTURE) ×2 IMPLANT
SUT VIC AB 2-0 CT2 18 VCP726D (SUTURE) ×3 IMPLANT
SYNCAGE EVOL MD 13.5X7.9 14D (Spacer) ×2 IMPLANT
SYR BULB IRRIGATION 50ML (SYRINGE) ×3 IMPLANT
TAPE CLOTH SURG 6X10 WHT LF (GAUZE/BANDAGES/DRESSINGS) ×2 IMPLANT
TOWEL GREEN STERILE (TOWEL DISPOSABLE) ×6 IMPLANT
TOWEL GREEN STERILE FF (TOWEL DISPOSABLE) ×3 IMPLANT
TRAY FOLEY W/BAG SLVR 16FR (SET/KITS/TRAYS/PACK) ×3
TRAY FOLEY W/BAG SLVR 16FR ST (SET/KITS/TRAYS/PACK) ×1 IMPLANT
WATER STERILE IRR 1000ML POUR (IV SOLUTION) ×3 IMPLANT
YANKAUER SUCT BULB TIP NO VENT (SUCTIONS) ×3 IMPLANT

## 2019-04-13 NOTE — Op Note (Signed)
PATIENT NAME: Ralph [name]   MEDICAL RECORD NO.:   [id number]    DATE OF BIRTH: 05/20/1966   DATE OF PROCEDURE: 04/13/2019                              OPERATIVE REPORT   PREOPERATIVE DIAGNOSES: 1.  Severe L5-S1 degenerative disc disease 2.  Status post previous L3-4 and L4-5 fusion 3.  L4-5 spinal stenosis resulting in ongoing back pain and leg pain  POSTOPERATIVE DIAGNOSES: 1.  Severe L5-S1 degenerative disc disease 2.  Status post previous L3-4 and L4-5 fusion 3.  L4-5 spinal stenosis resulting in ongoing back pain and leg pain  PROCEDURE (stage 1 of 2): 1. Anterior lumbar interbody fusion, L5-S1 (expsure peformed by Dr. Tawanna Coolerodd Early) 2. Insertion of interbody device x1 (Syncage intervertebral spacer, 13.675mm, medium, 14 degree lordotic). 3. Placement of anterior instrumentation securing the L5-S1 level. 4. Intraoperative use of fluoroscopy. 5. Use of morselized allograft - Vivigen 6. 1st assistant to Dr. Tawanna Coolerodd Early for retroperitoneal exposure  SURGEON:  Estill BambergMark Sierria Bruney, MD  ASSISTANT:  Jason CoopKayla McKenzie, PA-C  ANESTHESIA:  General endotracheal anesthesia.  COMPLICATIONS:  None.  DISPOSITION:  Stable.  ESTIMATED BLOOD LOSS:  minimal  INDICATIONS FOR SURGERY:  Briefly, [name] is a very pleasant 53 year old male, who has been having progressive debilitating pain in the back and left leg. The patient's MRI did reveal the findings noted above.  The patient did fail appropriate nonoperative measures, but did continue to have significant pain. Given his ongoing pain and dysfunction, we did discuss proceeding with the procedure noted above.  The patient was fully aware of the risks and limitations associated with surgery, and did wish to proceed.  The plan was to proceed with stage I of his procedure today, and to return tomorrow for stage II, specifically, a posterior fusion.   OPERATIVE DETAILS:  On 04/13/2019, the patient was brought to surgery and general endotracheal  anesthesia was administered.  The patient was placed supine on the hospital bed.  The patient's abdomen was prepped and draped in the usual sterile fashion.  An anterior retroperitoneal approach was then performed by Dr. Gretta Beganodd Early.  I did function as his first assistant during the approach.  Once the anterior lumbar spine was noted, we did focus our attention on the L5-S1 intervertebral space.  I then performed a thorough and complete L5-S1 intervertebral diskectomy to the level of the posterior longitudinal ligament. I was very pleased with the diskectomy that I was able to accomplish.  The endplates were then appropriately prepared and the appropriate sized anterior intervertebral spacer was packed with Vivigen and tamped into position. I was very pleased with the press-fit of the implant.  I then proceeded with placement of anterior instrumentation at the L5-S1 intervertebral space.  To accomplish this, I did use an awl to prepare the trajectory of anterior vertebral body screw.  An anterior fixation device was attached to the back end of the screw and did provide anterior fixation across the L5-S1 intervertebral space, securing the intervertebral implant into place.  I was very pleased with the press-fit of the anterior hardware.  I did liberally use AP and lateral fluoroscopy to ensure that the implant and anterior fixation was in the appropriate position, and was very pleased with the radiographs. The wound was copiously irrigated.  The fascia was closed using #1 PDS.  The subcutaneous layer was closed using 0 Vicryl  followed by 2-0 Vicryl, and the skin was closed using 4-0 Monocryl. Benzoin and Steri-Strips were applied followed by sterile dressing.   All instrument counts were correct at the termination of the procedure. I did use neurologic monitoring throughout the surgery, and there was no abnormal EMG activity noted throughout the surgery.  Of note, Pricilla Holm was my assistant  throughout surgery, and did aid in retraction, suctioning, and closure for both the anterior and posterior portions of the procedure.   Phylliss Bob, MD

## 2019-04-13 NOTE — Anesthesia Postprocedure Evaluation (Signed)
Anesthesia Post Note  Patient: Ralph [name]  Procedure(s) Performed: LUMBAR FIVE - SACRUM ONE  ANTERIOR LUMBAR INTERBODY FUSION WITH INSTRUMENTATION AND ALLOGRAFT (N/A ) ABDOMINAL EXPOSURE (N/A )     Patient location during evaluation: PACU Anesthesia Type: General Level of consciousness: awake and alert Pain management: pain level controlled Vital Signs Assessment: post-procedure vital signs reviewed and stable Respiratory status: spontaneous breathing, nonlabored ventilation, respiratory function stable and patient connected to nasal cannula oxygen Cardiovascular status: blood pressure returned to baseline and stable Postop Assessment: no apparent nausea or vomiting Anesthetic complications: no    Last Vitals:  Vitals:   04/13/19 1230 04/13/19 1236  BP:  (!) 89/54  Pulse:  (!) 56  Resp:  12  Temp:  (!) 36.1 C  SpO2: 100% 100%    Last Pain:  Vitals:   04/13/19 1217  PainSc: Asleep                 Lateshia Schmoker S

## 2019-04-13 NOTE — Progress Notes (Signed)
Orthopedic Tech Progress Note Patient Details:  Duilio Heritage 11/23/1965 [id number] Called in order to [facility] for a TLSO brace Patient ID: Alwyn Pea, male   DOB: October 16, 1965, 53 y.o.   MRN: [id number]   Janit Pagan 04/13/2019, 12:07 PM

## 2019-04-13 NOTE — Transfer of Care (Signed)
Immediate Anesthesia Transfer of Care Note  Patient: Ralph [name]  Procedure(s) Performed: LUMBAR FIVE - SACRUM ONE  ANTERIOR LUMBAR INTERBODY FUSION WITH INSTRUMENTATION AND ALLOGRAFT (N/A ) ABDOMINAL EXPOSURE (N/A )  Patient Location: PACU  Anesthesia Type:General  Level of Consciousness: awake, alert  and oriented  Airway & Oxygen Therapy: Patient Spontanous Breathing and Patient connected to nasal cannula oxygen  Post-op Assessment: Report given to RN and Post -op Vital signs reviewed and stable  Post vital signs: Reviewed and stable  Last Vitals:  Vitals Value Taken Time  BP 102/59 04/13/19 1143  Temp    Pulse 64 04/13/19 1146  Resp 15 04/13/19 1146  SpO2 99 % 04/13/19 1146  Vitals shown include unvalidated device data.  Last Pain:  Vitals:   04/13/19 0725  PainSc: 5          Complications: No apparent anesthesia complications

## 2019-04-13 NOTE — Anesthesia Procedure Notes (Signed)
Procedure Name: Intubation Date/Time: 04/13/2019 8:38 AM Performed by: Clearnce Sorrel, CRNA Pre-anesthesia Checklist: Patient identified, Emergency Drugs available, Suction available, Patient being monitored and Timeout performed Patient Re-evaluated:Patient Re-evaluated prior to induction Oxygen Delivery Method: Circle system utilized Preoxygenation: Pre-oxygenation with 100% oxygen Induction Type: IV induction Ventilation: Mask ventilation without difficulty and Oral airway inserted - appropriate to patient size Laryngoscope Size: Mac and 4 Grade View: Grade II Tube type: Oral Tube size: 7.5 mm Number of attempts: 1 Airway Equipment and Method: Stylet Placement Confirmation: ETT inserted through vocal cords under direct vision,  positive ETCO2 and breath sounds checked- equal and bilateral Secured at: 23 cm Tube secured with: Tape Dental Injury: Teeth and Oropharynx as per pre-operative assessment

## 2019-04-13 NOTE — Progress Notes (Signed)
Placed patient on CPAP for the night via auto-mode with minimum pressure set at 5cm and maximum pressure set at 20cm  
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
## 2019-04-14 ENCOUNTER — Inpatient Hospital Stay (HOSPITAL_COMMUNITY): Payer: Medicare PPO | Admitting: Certified Registered"

## 2019-04-14 ENCOUNTER — Encounter (HOSPITAL_COMMUNITY)
Admission: RE | Disposition: A | Discharge status: Home / Self Care | Payer: Self-pay | Source: Home / Self Care | Attending: Orthopedic Surgery

## 2019-04-14 ENCOUNTER — Inpatient Hospital Stay (HOSPITAL_COMMUNITY): Payer: Medicare PPO

## 2019-04-14 ENCOUNTER — Inpatient Hospital Stay (HOSPITAL_COMMUNITY): Admission: RE | Admit: 2019-04-14 | Payer: Medicare PPO | Source: Home / Self Care | Admitting: Orthopedic Surgery

## 2019-04-14 SURGERY — POSTERIOR LUMBAR FUSION 1 LEVEL
Anesthesia: General

## 2019-04-14 MED ORDER — ALBUMIN HUMAN 5 % IV SOLN
INTRAVENOUS | Status: DC | PRN
  Administered 2019-04-14: 09:00:00 via INTRAVENOUS

## 2019-04-14 MED ORDER — BUPIVACAINE LIPOSOME 1.3 % IJ SUSP
INTRAMUSCULAR | Status: DC | PRN
  Administered 2019-04-14: 20 mL

## 2019-04-14 MED ORDER — DIAZEPAM 5 MG PO TABS: 5.0000 mg | ORAL_TABLET | Freq: Four times a day (QID) | ORAL | 0 refills | Status: DC | PRN

## 2019-04-14 MED ORDER — OXYCODONE HCL 5 MG/5ML PO SOLN: 5.0000 mg | Freq: Once | ORAL | Status: DC | PRN

## 2019-04-14 MED ORDER — LACTATED RINGERS IV SOLN
INTRAVENOUS | Status: DC | PRN
  Administered 2019-04-14 (×2): via INTRAVENOUS

## 2019-04-14 MED ORDER — MIDAZOLAM HCL 5 MG/5ML IJ SOLN
INTRAMUSCULAR | Status: DC | PRN
  Administered 2019-04-14: 2 mg via INTRAVENOUS

## 2019-04-14 MED ORDER — PHENYLEPHRINE 40 MCG/ML (10ML) SYRINGE FOR IV PUSH (FOR BLOOD PRESSURE SUPPORT): PREFILLED_SYRINGE | INTRAVENOUS | Status: DC | PRN

## 2019-04-14 MED ORDER — OXYCODONE HCL 5 MG PO TABS: 5.0000 mg | ORAL_TABLET | Freq: Once | ORAL | Status: DC | PRN

## 2019-04-14 MED ORDER — FENTANYL CITRATE (PF) 100 MCG/2ML IJ SOLN
25.0000 ug | INTRAMUSCULAR | Status: DC | PRN
  Administered 2019-04-14: 50 ug via INTRAVENOUS

## 2019-04-14 MED ORDER — THROMBIN 20000 UNITS EX SOLR
CUTANEOUS | Status: AC
  Filled 2019-04-14: qty 20000

## 2019-04-14 MED ORDER — ONDANSETRON HCL 4 MG/2ML IJ SOLN: 4.0000 mg | Freq: Four times a day (QID) | INTRAMUSCULAR | Status: DC | PRN

## 2019-04-14 MED ORDER — OXYCODONE-ACETAMINOPHEN 5-325 MG PO TABS: 1.0000 | ORAL_TABLET | ORAL | 0 refills | Status: DC | PRN

## 2019-04-14 MED ORDER — SUGAMMADEX SODIUM 200 MG/2ML IV SOLN
INTRAVENOUS | Status: DC | PRN
  Administered 2019-04-14: 200 mg via INTRAVENOUS

## 2019-04-14 MED ORDER — ROCURONIUM BROMIDE 10 MG/ML (PF) SYRINGE
PREFILLED_SYRINGE | INTRAVENOUS | Status: DC | PRN
  Administered 2019-04-14: 10 mg via INTRAVENOUS
  Administered 2019-04-14: 50 mg via INTRAVENOUS
  Administered 2019-04-14: 10 mg via INTRAVENOUS

## 2019-04-14 MED ORDER — FENTANYL CITRATE (PF) 250 MCG/5ML IJ SOLN
INTRAMUSCULAR | Status: AC
  Filled 2019-04-14: qty 5

## 2019-04-14 MED ORDER — BUPIVACAINE-EPINEPHRINE 0.25% -1:200000 IJ SOLN
INTRAMUSCULAR | Status: DC | PRN
  Administered 2019-04-14: 9 mL

## 2019-04-14 MED ORDER — MIDAZOLAM HCL 2 MG/2ML IJ SOLN
INTRAMUSCULAR | Status: AC
  Filled 2019-04-14: qty 2

## 2019-04-14 MED ORDER — METHYLENE BLUE 0.5 % INJ SOLN
INTRAVENOUS | Status: AC
  Filled 2019-04-14: qty 10

## 2019-04-14 MED ORDER — BUPIVACAINE-EPINEPHRINE (PF) 0.25% -1:200000 IJ SOLN
INTRAMUSCULAR | Status: AC
  Filled 2019-04-14: qty 30

## 2019-04-14 MED ORDER — PROPOFOL 10 MG/ML IV BOLUS
INTRAVENOUS | Status: AC
  Filled 2019-04-14: qty 20

## 2019-04-14 MED ORDER — PROPOFOL 500 MG/50ML IV EMUL
INTRAVENOUS | Status: DC | PRN
  Administered 2019-04-14: 50 ug/kg/min via INTRAVENOUS

## 2019-04-14 MED ORDER — BUPIVACAINE LIPOSOME 1.3 % IJ SUSP
20.0000 mL | Freq: Once | INTRAMUSCULAR | Status: DC
  Filled 2019-04-14: qty 20

## 2019-04-14 MED ORDER — THROMBIN 20000 UNITS EX SOLR
CUTANEOUS | Status: DC | PRN
  Administered 2019-04-14: 20 mL via TOPICAL

## 2019-04-14 MED ORDER — OXYCODONE HCL 5 MG PO TABS
ORAL_TABLET | ORAL | Status: AC
  Filled 2019-04-14: qty 1

## 2019-04-14 MED ORDER — PROPOFOL 10 MG/ML IV BOLUS
INTRAVENOUS | Status: DC | PRN
  Administered 2019-04-14: 200 mg via INTRAVENOUS

## 2019-04-14 MED ORDER — FENTANYL CITRATE (PF) 250 MCG/5ML IJ SOLN
INTRAMUSCULAR | Status: DC | PRN
  Administered 2019-04-14 (×7): 50 ug via INTRAVENOUS

## 2019-04-14 MED ORDER — ALLOPURINOL 300 MG PO TABS
300.0000 mg | ORAL_TABLET | Freq: Every day | ORAL | Status: DC
  Administered 2019-04-15: 300 mg via ORAL
  Filled 2019-04-14: qty 1

## 2019-04-14 MED ORDER — ONDANSETRON HCL 4 MG/2ML IJ SOLN
INTRAMUSCULAR | Status: DC | PRN
  Administered 2019-04-14: 4 mg via INTRAVENOUS

## 2019-04-14 MED ORDER — 0.9 % SODIUM CHLORIDE (POUR BTL) OPTIME
TOPICAL | Status: DC | PRN
  Administered 2019-04-14 (×2): 1000 mL

## 2019-04-14 MED ORDER — METOPROLOL SUCCINATE ER 200 MG PO TB24
200.0000 mg | ORAL_TABLET | Freq: Every day | ORAL | Status: DC
  Filled 2019-04-14 (×2): qty 1

## 2019-04-14 MED ORDER — FENTANYL CITRATE (PF) 100 MCG/2ML IJ SOLN
INTRAMUSCULAR | Status: AC
  Filled 2019-04-14: qty 2

## 2019-04-14 MED ORDER — LIDOCAINE 2% (20 MG/ML) 5 ML SYRINGE
INTRAMUSCULAR | Status: DC | PRN
  Administered 2019-04-14: 60 mg via INTRAVENOUS

## 2019-04-14 MED ORDER — SODIUM CHLORIDE 0.9 % IV SOLN
INTRAVENOUS | Status: DC | PRN
  Administered 2019-04-14: 30 ug/min via INTRAVENOUS

## 2019-04-14 SURGICAL SUPPLY — 97 items
AGENT HMST KT MTR STRL THRMB (HEMOSTASIS)
APL SKNCLS STERI-STRIP NONHPOA (GAUZE/BANDAGES/DRESSINGS) ×1
BENZOIN TINCTURE PRP APPL 2/3 (GAUZE/BANDAGES/DRESSINGS) ×3 IMPLANT
BLADE CLIPPER SURG (BLADE) ×2 IMPLANT
BONE VIVIGEN FORMABLE 1.3CC (Bone Implant) ×3 IMPLANT
BUR PRESCISION 1.7 ELITE (BURR) ×3 IMPLANT
BUR ROUND FLUTED 5 RND (BURR) ×2 IMPLANT
BUR ROUND FLUTED 5MM RND (BURR) ×1
BUR ROUND PRECISION 4.0 (BURR) IMPLANT
BUR ROUND PRECISION 4.0MM (BURR)
BUR SABER RD CUTTING 3.0 (BURR) IMPLANT
BUR SABER RD CUTTING 3.0MM (BURR)
CARTRIDGE OIL MAESTRO DRILL (MISCELLANEOUS) ×1 IMPLANT
CLOSURE WOUND 1/2 X4 (GAUZE/BANDAGES/DRESSINGS) ×1
CONT SPEC 4OZ CLIKSEAL STRL BL (MISCELLANEOUS) ×3 IMPLANT
COVER MAYO STAND STRL (DRAPES) ×6 IMPLANT
COVER SURGICAL LIGHT HANDLE (MISCELLANEOUS) ×1 IMPLANT
COVER WAND RF STERILE (DRAPES) ×1 IMPLANT
DIFFUSER DRILL AIR PNEUMATIC (MISCELLANEOUS) ×3 IMPLANT
DRAIN CHANNEL 15F RND FF W/TCR (WOUND CARE) IMPLANT
DRAPE C-ARM 42X72 X-RAY (DRAPES) ×3 IMPLANT
DRAPE C-ARMOR (DRAPES) ×2 IMPLANT
DRAPE POUCH INSTRU U-SHP 10X18 (DRAPES) ×3 IMPLANT
DRAPE SURG 17X23 STRL (DRAPES) ×12 IMPLANT
DURAPREP 26ML APPLICATOR (WOUND CARE) ×3 IMPLANT
ELECT BLADE 4.0 EZ CLEAN MEGAD (MISCELLANEOUS) ×6
ELECT CAUTERY BLADE 6.4 (BLADE) ×3 IMPLANT
ELECT REM PT RETURN 9FT ADLT (ELECTROSURGICAL) ×3
ELECTRODE BLDE 4.0 EZ CLN MEGD (MISCELLANEOUS) ×1 IMPLANT
ELECTRODE REM PT RTRN 9FT ADLT (ELECTROSURGICAL) ×1 IMPLANT
EVACUATOR SILICONE 100CC (DRAIN) IMPLANT
FEE INTRAOP MONITOR IMPULS NCS (MISCELLANEOUS) IMPLANT
FILTER STRAW FLUID ASPIR (MISCELLANEOUS) ×3 IMPLANT
GAUZE 4X4 16PLY RFD (DISPOSABLE) ×3 IMPLANT
GAUZE SPONGE 4X4 12PLY STRL (GAUZE/BANDAGES/DRESSINGS) ×3 IMPLANT
GLOVE BIO SURGEON STRL SZ7 (GLOVE) ×3 IMPLANT
GLOVE BIO SURGEON STRL SZ8 (GLOVE) ×3 IMPLANT
GLOVE BIOGEL PI IND STRL 7.0 (GLOVE) ×1 IMPLANT
GLOVE BIOGEL PI IND STRL 8 (GLOVE) ×1 IMPLANT
GLOVE BIOGEL PI INDICATOR 7.0 (GLOVE) ×2
GLOVE BIOGEL PI INDICATOR 8 (GLOVE) ×2
GOWN STRL REUS W/ TWL LRG LVL3 (GOWN DISPOSABLE) ×2 IMPLANT
GOWN STRL REUS W/ TWL XL LVL3 (GOWN DISPOSABLE) ×1 IMPLANT
GOWN STRL REUS W/TWL LRG LVL3 (GOWN DISPOSABLE) ×6
GOWN STRL REUS W/TWL XL LVL3 (GOWN DISPOSABLE) ×3
GRAFT BNE MATRIX VG FRMBL SM 1 (Bone Implant) IMPLANT
GUIDEWIRE BLUNT VIPER II 1.45 (WIRE) ×2 IMPLANT
GUIDEWIRE SHARP VIPER II (WIRE) ×4 IMPLANT
INTRAOP MONITOR FEE IMPULS NCS (MISCELLANEOUS) ×1
INTRAOP MONITOR FEE IMPULSE (MISCELLANEOUS) ×2
IV CATH 14GX2 1/4 (CATHETERS) ×3 IMPLANT
KIT ALARA NEURO ACCESS (KITS) ×4 IMPLANT
KIT BASIN OR (CUSTOM PROCEDURE TRAY) ×3 IMPLANT
KIT POSITION SURG JACKSON T1 (MISCELLANEOUS) ×3 IMPLANT
KIT TURNOVER KIT B (KITS) ×3 IMPLANT
MARKER SKIN DUAL TIP RULER LAB (MISCELLANEOUS) ×6 IMPLANT
NDL 18GX1X1/2 (RX/OR ONLY) (NEEDLE) ×1 IMPLANT
NDL HYPO 25GX1X1/2 BEV (NEEDLE) ×1 IMPLANT
NDL SPNL 18GX3.5 QUINCKE PK (NEEDLE) ×2 IMPLANT
NEEDLE 18GX1X1/2 (RX/OR ONLY) (NEEDLE) ×3 IMPLANT
NEEDLE 22X1 1/2 (OR ONLY) (NEEDLE) ×6 IMPLANT
NEEDLE HYPO 25GX1X1/2 BEV (NEEDLE) ×3 IMPLANT
NEEDLE SPNL 18GX3.5 QUINCKE PK (NEEDLE) ×6 IMPLANT
NS IRRIG 1000ML POUR BTL (IV SOLUTION) ×5 IMPLANT
OIL CARTRIDGE MAESTRO DRILL (MISCELLANEOUS) ×3
PACK LAMINECTOMY ORTHO (CUSTOM PROCEDURE TRAY) ×3 IMPLANT
PACK UNIVERSAL I (CUSTOM PROCEDURE TRAY) ×3 IMPLANT
PAD ARMBOARD 7.5X6 YLW CONV (MISCELLANEOUS) ×6 IMPLANT
PATTIES SURGICAL .5 X1 (DISPOSABLE) ×3 IMPLANT
PATTIES SURGICAL .5X1.5 (GAUZE/BANDAGES/DRESSINGS) ×3 IMPLANT
PROBE PEDCLE PROBE MAGSTM DISP (MISCELLANEOUS) ×2 IMPLANT
ROD LORDOSED 75MM VIPER 2 (Rod) ×4 IMPLANT
SCREW SET SINGLE INNER MIS (Screw) ×12 IMPLANT
SCREW XTAB POLY VIPER  7X45 (Screw) ×4 IMPLANT
SCREW XTAB POLY VIPER 7X45 (Screw) IMPLANT
SPONGE INTESTINAL PEANUT (DISPOSABLE) ×3 IMPLANT
SPONGE SURGIFOAM ABS GEL 100 (HEMOSTASIS) ×3 IMPLANT
STRIP CLOSURE SKIN 1/2X4 (GAUZE/BANDAGES/DRESSINGS) ×3 IMPLANT
SURGIFLO W/THROMBIN 8M KIT (HEMOSTASIS) IMPLANT
SUT BONE WAX W31G (SUTURE) ×2 IMPLANT
SUT MNCRL AB 4-0 PS2 18 (SUTURE) ×3 IMPLANT
SUT VIC AB 0 CT1 18XCR BRD 8 (SUTURE) ×1 IMPLANT
SUT VIC AB 0 CT1 8-18 (SUTURE) ×3
SUT VIC AB 1 CT1 18XBRD ANBCTR (SUTURE) IMPLANT
SUT VIC AB 1 CT1 18XCR BRD 8 (SUTURE) ×1 IMPLANT
SUT VIC AB 1 CT1 8-18 (SUTURE) ×6
SUT VIC AB 2-0 CT1 18 (SUTURE) IMPLANT
SUT VIC AB 2-0 CT2 18 VCP726D (SUTURE) ×5 IMPLANT
SYR 20ML LL LF (SYRINGE) ×6 IMPLANT
SYR BULB IRRIGATION 50ML (SYRINGE) ×3 IMPLANT
SYR CONTROL 10ML LL (SYRINGE) ×8 IMPLANT
SYR TB 1ML LUER SLIP (SYRINGE) ×3 IMPLANT
TAP CANN VIPER2 DL 6.0 (TAP) ×4 IMPLANT
TAPE CLOTH SURG 4X10 WHT LF (GAUZE/BANDAGES/DRESSINGS) ×2 IMPLANT
TRAY FOLEY MTR SLVR 16FR STAT (SET/KITS/TRAYS/PACK) ×1 IMPLANT
WATER STERILE IRR 1000ML POUR (IV SOLUTION) ×3 IMPLANT
YANKAUER SUCT BULB TIP NO VENT (SUCTIONS) ×3 IMPLANT

## 2019-04-14 NOTE — Anesthesia Procedure Notes (Signed)
Arterial Line Insertion Start/End9/05/2019 7:05 AM, 04/14/2019 7:15 AM Performed by: Gaylene Brooks, CRNA  Preanesthetic checklist: patient identified, IV checked, site marked, risks and benefits discussed, surgical consent, monitors and equipment checked, pre-op evaluation, timeout performed and anesthesia consent Lidocaine 1% used for infiltration and patient sedated Right, radial was placed Catheter size: 20 G Hand hygiene performed  and maximum sterile barriers used  Allen's test indicative of satisfactory collateral circulation Attempts: 1 Procedure performed without using ultrasound guided technique. Following insertion, dressing applied and Biopatch. Post procedure assessment: normal  Patient tolerated the procedure well with no immediate complications.

## 2019-04-14 NOTE — Progress Notes (Signed)
Patient tolerated stage 1 of his procedure yesterday well.  He presents today for stage 2 of what was to be a two-staged procedure, specifically, a posterior spinal fusion at L5-S1, with revision instrumentation.  We will proceed as planned.

## 2019-04-14 NOTE — Progress Notes (Signed)
Patient ID: Ralph [name], male   DOB: [birth year], 53 y.o.   MRN: [id number] Comfortable this morning.  Currently in short stay unit preparing for posterior fusion with Dr. Lynann Bologna. Abdomen soft with mild left lower quadrant tenderness.  No nausea. Stable from anterior exposure and fusion.  Will not follow actively.  Please notify us if we can provide assistance

## 2019-04-14 NOTE — Transfer of Care (Signed)
Immediate Anesthesia Transfer of Care Note  Patient: Ralph [name]  Procedure(s) Performed: LUMBAR FIVE - SACRUM ONE POSTERIOR SPINAL FUSION WITH REVISION. INSTRUMENTATION AND ALLOGRAFT (N/A )  Patient Location: PACU  Anesthesia Type:General  Level of Consciousness: awake, alert  and oriented  Airway & Oxygen Therapy: Patient Spontanous Breathing and Patient connected to face mask oxygen  Post-op Assessment: Report given to RN, Post -op Vital signs reviewed and stable and Patient moving all extremities X 4  Post vital signs: Reviewed and stable  Last Vitals:  Vitals Value Taken Time  BP 104/70 04/14/19 1130  Temp    Pulse 75 04/14/19 1130  Resp 16 04/14/19 1130  SpO2 97 % 04/14/19 1130  Vitals shown include unvalidated device data.  Last Pain:  Vitals:   04/14/19 0507  TempSrc:   PainSc: 5       Patients Stated Pain Goal: 2 (63/01/60 1093)  Complications: No apparent anesthesia complications

## 2019-04-14 NOTE — Anesthesia Procedure Notes (Signed)
Procedure Name: Intubation Date/Time: 04/14/2019 7:50 AM Performed by: Gaylene Brooks, CRNA Pre-anesthesia Checklist: Patient identified, Emergency Drugs available, Suction available and Patient being monitored Patient Re-evaluated:Patient Re-evaluated prior to induction Oxygen Delivery Method: Circle System Utilized Preoxygenation: Pre-oxygenation with 100% oxygen Induction Type: IV induction Ventilation: Mask ventilation without difficulty and Oral airway inserted - appropriate to patient size Laryngoscope Size: Mac and 4 Grade View: Grade I Tube type: Oral Tube size: 7.5 mm Number of attempts: 2 Airway Equipment and Method: Stylet and Oral airway Placement Confirmation: ETT inserted through vocal cords under direct vision,  positive ETCO2 and breath sounds checked- equal and bilateral Secured at: 23 cm Tube secured with: Tape Dental Injury: Teeth and Oropharynx as per pre-operative assessment  Comments: DL with miller 2, grade 2 view, unable to pass ETT. DL with Mac 4 by MDA , ETT passed easily. AOI. +ETCO2, BBS=

## 2019-04-14 NOTE — Progress Notes (Signed)
PT Cancellation Note  Patient Details Name: Ralph [name] MRN: [id number] DOB: 08-May-1966   Cancelled Treatment:    Reason Eval/Treat Not Completed: Patient at procedure or test/unavailable  Patient assisted to bathroom by nursing just prior to arrival. After 10 minutes, pt still needing more time "It takes me a long time to go." Patient has access to call bell and RN &nurse tech aware he is still in restroom. Will see 04/15/19    Barry Brunner, PT    Jeanie Cooks Jimmy Stipes 04/14/2019, 4:26 PM

## 2019-04-14 NOTE — Op Note (Signed)
PATIENT NAME: Ralph [name] RECORD NO.:   [id number]          DATE OF BIRTH: [birth year]  DATE OF PROCEDURE: 04/14/2019                               OPERATIVE REPORT     PREOPERATIVE DIAGNOSES: 1.  Status post previous L3-4 and L4-5 fusion with instrumentation years ago 2.  1 day status post L5-S1 anterior lumbar interbody fusion, requiring posterior fusion with revision instrumentation   POSTOPERATIVE DIAGNOSES:   1.  Status post previous L3-4 and L4-5 fusion with instrumentation years ago 2.  1 day status post L5-S1 anterior lumbar interbody fusion, requiring posterior fusion with revision instrumentation   PROCEDURE (Stage 2): 1. Posterior spinal fusion, L5-S1. 2. Placement of posterior segmental instrumentation extended to S1, bilaterally. 3. Removal of previously placed instrumentation at L3 4. Use of morselized allograft - Vivagen. 5. Intraoperative use of floroscopy   SURGEON:  Phylliss Bob, MD   ASSISTANT:  Pricilla Holm, PA-C   ANESTHESIA:  General endotracheal anesthesia.   COMPLICATIONS:  None.   DISPOSITION:  Stable.   ESTIMATED BLOOD LOSS:  Minimal   INDICATIONS FOR SURGERY: Briefly, Mr. Kocsis is one day status post an anterior lumbar fusion as noted above.  Please refer to my operative report dated 04/13/2019, for a full account of the patient's preoperative history and indications for surgery.  The patient did present today for stage 2 of what was to be a 2-staged procedure.   OPERATIVE DETAILS:  On 04/14/2019, the patient was brought to surgery and general endotracheal anesthesia was administered.  The patient was placed prone onto a Jackson spinal bed.  The back was then prepped and draped in the usual sterile fashion.  I then made a midline incision spanning L3-S1.   The patient's previously placed instrumentation was noted.  The bilateral interconnecting rods spanning L3-L4 were removed, as were the bilateral L3 pedicle screws.  The  interconnecting rod spanning L4-L5 was also removed after removing the caps.  At this point, I did decorticate the bilateral L5-S1 facet joints using a high-speed bur.  I then placed bilateral pedicle screws at S1.  7 x 45 mm screws were placed.  An interconnecting rod was secured into the tulip heads of the screws spanning L4, L5 and S1.  Caps were placed, and a final tightening maneuver was performed over the caps.  I was very pleased with the final AP and lateral fluoroscopic images.  At this point, vivigen was packed into the posterolateral gutters at L5-S1 bilaterally to aid in the success of the posterior fusion. The wound was copiously irrigated.  The fascia was closed using #1 Vicryl.  The subcutaneous layer was closed using 0 Vicryl followed by 2-0 Vicryl, and the skin was then closed using 4-0 Monocryl. Benzoin and Steri-Strips were applied followed by sterile dressing.  All instrument counts were correct at the termination of the procedure. I did use neurologic monitoring throughout the surgery, and there was no abnormal EMG activity noted throughout the surgery.   Of note, Pricilla Holm was my assistant throughout surgery, and did aid in retraction, suctioning, and closure for both the anterior and posterior portions of the procedure.      Phylliss Bob, MD
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
## 2019-04-15 ENCOUNTER — Encounter (HOSPITAL_COMMUNITY): Payer: Self-pay | Admitting: Orthopedic Surgery

## 2019-04-15 NOTE — Plan of Care (Signed)
Patient alert and oriented, mae's well, voiding adequate amount of urine, swallowing without difficulty, no c/o pain at time of discharge. Patient discharged home with family. Script and discharged instructions given to patient. Patient and family stated understanding of instructions given. Patient has an appointment with Dr. Dumonski 

## 2019-04-15 NOTE — Progress Notes (Signed)
    Patient doing well  + expected LBP Patient denies leg pain   Physical Exam: Vitals:   04/15/19 0412 04/15/19 0757  BP: 113/67 112/66  Pulse: 70 77  Resp: 20 16  Temp: 98.2 F (36.8 C) 97.7 F (36.5 C)  SpO2: 93% 100%    Dressing in place NVI  Pt s/p A/P fusion, doing well  - up with PT/OT, encourage ambulation - Percocet for pain, Valium for muscle spasms - likely d/c home today with f/u in 2 weeks

## 2019-04-15 NOTE — Evaluation (Signed)
Occupational Therapy Evaluation Patient Details Name: Ralph [name] MRN: [id number] DOB: 09/12/1965 Today's Date: 04/15/2019    History of Present Illness Pt is a 53 y/o male now s/p posterior spinal fusion and instrumentationL5-S1, removal of previously placed instrumentation at L3. PMHx includes previous spinal sx (2014 and 2018), hx CVA, cervical myelopathy, depression, HTN, previous SCI C5-C7   Clinical Impression   This 53 y/o male presents with the above. PTA pt reports he is mod independent with ADL and functional mobility, using two walking sticks. Pt currently performing functional mobility using RW with overall minguard-light minA. He currently requires minA for brace management, modA for LB ADL. Pt reports plans to return home with assist from daughter and spouse for ADL/iADL needs. Educated pt re: back precautions, brace management, safety and compensatory techniques for completing ADL and functional transfers after discharge home. Pt verbalizing understanding and asking questions appropriately. He will benefit from continued acute OT services to maximize his safety and independence with ADL and mobility prior to discharge home. Will follow.     Follow Up Recommendations  No OT follow up;Supervision/Assistance - 24 hour(24hr initially)    Equipment Recommendations  None recommended by OT(pt's DME needs are met)           Precautions / Restrictions Precautions Precautions: Back;Fall Precaution Booklet Issued: Yes (comment) Precaution Comments: issued and reviewed Required Braces or Orthoses: Spinal Brace Spinal Brace: Thoracolumbosacral orthotic;Applied in sitting position;Applied in standing position Restrictions Weight Bearing Restrictions: No      Mobility Bed Mobility Overal bed mobility: Needs Assistance Bed Mobility: Rolling;Sidelying to Sit;Sit to Sidelying Rolling: Supervision Sidelying to sit: Min guard     Sit to sidelying: Min guard General bed  mobility comments: minguard for safety and increased time to perform, pt using UEs to assist with LEs onto EOB when returning to sidelying; pt with good understanding of log roll technique, use of bedrail from flat bed, discussed strategies to perform without having bedrail at home   Transfers Overall transfer level: Needs assistance Equipment used: Rolling walker (2 wheeled) Transfers: Sit to/from Stand Sit to Stand: Min guard;Min assist         General transfer comment: VCs for safe hand placement and RW placement as pt tending to prefer to pull up on RW, close minguard/intermittent minA for safety and balance; performed multiple sit<>stand during session    Balance Overall balance assessment: Needs assistance Sitting-balance support: Single extremity supported;No upper extremity supported Sitting balance-Leahy Scale: Good     Standing balance support: Single extremity supported;During functional activity;Bilateral upper extremity supported Standing balance-Leahy Scale: Poor Standing balance comment: overall reliant on at least single UE support for balance                           ADL either performed or assessed with clinical judgement   ADL Overall ADL's : Needs assistance/impaired Eating/Feeding: Modified independent;Sitting   Grooming: Set up;Sitting   Upper Body Bathing: Min guard;Sitting   Lower Body Bathing: Moderate assistance;Sit to/from stand   Upper Body Dressing : Set up;Minimal assistance;Sitting Upper Body Dressing Details (indicate cue type and reason): donning overhead shirt; pt requiring assist for TLSO, reports spouse will assist at home Lower Body Dressing: Moderate assistance;Sit to/from stand Lower Body Dressing Details (indicate cue type and reason): pt requires assist to advance shorts over/off of hips, educated in compensatory technique for back precautions and no bending as pt unable to don shorts without doing so,  reports spouse can  assist  Toilet Transfer: Ambulation;Min guard;Minimal assistance Toilet Transfer Details (indicate cue type and reason): simulated via transfer to/from EOB Toileting- Clothing Manipulation and Hygiene: Minimal assistance;Sit to/from stand Toileting - Clothing Manipulation Details (indicate cue type and reason): for clothing management     Functional mobility during ADLs: Minimal assistance;Rolling walker General ADL Comments: pt asking good questions related to precautions during session, currently requires external assist for ADL      Vision         Perception     Praxis      Pertinent Vitals/Pain Pain Assessment: Faces Faces Pain Scale: Hurts little more Pain Location: incisional Pain Descriptors / Indicators: Guarding;Grimacing;Sore Pain Intervention(s): Repositioned;Monitored during session;Limited activity within patient's tolerance     Hand Dominance Right   Extremity/Trunk Assessment Upper Extremity Assessment Upper Extremity Assessment: LUE deficits/detail LUE Deficits / Details: baseline weakness from previous SCI, pt digits tend to rest in flexed position, pt able to passively extend to place items in hand but reports with extended use would likely drop item due to weakness  LUE Coordination: decreased fine motor   Lower Extremity Assessment Lower Extremity Assessment: Defer to PT evaluation   Cervical / Trunk Assessment Cervical / Trunk Assessment: Other exceptions Cervical / Trunk Exceptions: s/p spinal sx   Communication Communication Communication: No difficulties   Cognition Arousal/Alertness: Awake/alert Behavior During Therapy: WFL for tasks assessed/performed Overall Cognitive Status: Within Functional Limits for tasks assessed                                     General Comments       Exercises     Shoulder Instructions      Home Living Family/patient expects to be discharged to:: Private residence Living Arrangements:  Spouse/significant other;Children Available Help at Discharge: Family Type of Home: House Home Access: Stairs to enter [employer] of Steps: 2 onto deck, 1 to enter home Entrance Stairs-Rails: Right Home Layout: One level(small ledges from one room to another)     Bathroom Shower/Tub: Hospital doctor Toilet: Handicapped height     Home Equipment: Environmental consultant - 2 wheels;Bedside commode;Shower seat;Wheelchair - power;Other (comment)(walking sticks)          Prior Functioning/Environment Level of Independence: Independent with assistive device(s)        Comments: using two walking sticks for mobility, reports able to perform ADL without assist        OT Problem List: Decreased strength;Decreased activity tolerance;Decreased range of motion;Impaired balance (sitting and/or standing);Decreased knowledge of use of DME or AE;Decreased knowledge of precautions;Pain;Impaired UE functional use      OT Treatment/Interventions: Self-care/ADL training;Therapeutic exercise;Energy conservation;DME and/or AE instruction;Therapeutic activities;Patient/family education;Balance training    OT Goals(Current goals can be found in the care plan section) Acute Rehab OT Goals Patient Stated Goal: return home, regain independence and strength OT Goal Formulation: With patient Time For Goal Achievement: 04/29/19 Potential to Achieve Goals: Good  OT Frequency: Min 2X/week   Barriers to D/C:            Co-evaluation              AM-PAC OT "6 Clicks" Daily Activity     Outcome Measure Help from another person eating meals?: None Help from another person taking care of personal grooming?: None Help from another person toileting, which includes using toliet, bedpan, or urinal?: A Little  Help from another person bathing (including washing, rinsing, drying)?: A Little Help from another person to put on and taking off regular upper body clothing?: A Little Help from another  person to put on and taking off regular lower body clothing?: A Lot 6 Click Score: 19   End of Session Equipment Utilized During Treatment: Rolling walker;Back brace Nurse Communication: Mobility status  Activity Tolerance: Patient tolerated treatment well Patient left: in bed;with call bell/phone within reach  OT Visit Diagnosis: Other abnormalities of gait and mobility (R26.89);Muscle weakness (generalized) (M62.81)                Time: 6060-0459 OT Time Calculation (min): 34 min Charges:  OT General Charges $OT Visit: 1 Visit OT Evaluation $OT Eval Moderate Complexity: 1 Mod OT Treatments $Self Care/Home Management : 8-22 mins  Lou Cal, OT Supplemental Rehabilitation Services Pager [phone] Office [phone]  Raymondo Band 04/15/2019, 10:21 AM

## 2019-04-15 NOTE — Evaluation (Signed)
Physical Therapy Evaluation Patient Details Name: Ralph [name] MRN: [id number] DOB: 1965/11/19 Today's Date: 04/15/2019   History of Present Illness  Pt is a 53 y/o male now s/p 2 stage surgery, s/p L5-S1 ALIF 9/9 and 2nd stage L5-S1 PLIF 9/10 with removal of previously placed instrumentation at L3. PMHx includes previous spinal sx (2014 and 2018), hx CVA, cervical myelopathy, depression, HTN, previous SCI C5-C7  Clinical Impression  Patient presents with pain and post surgical deficits s/p above surgery. Pt with hx of SCI and has weakness in BLEs as well as muscle spasms which he has a Baclofen pump (which he gets adjusted frequently). Today, pt requires Min guard assist for bed mobility, transfers and gait training with RW for support/balance. Attempted gait with walking sticks however pt with worsened instability and flexed posture so agreed RW was safer. Lengthy discussion and education re: strengthening program once cleared by MD, recommendations for DME use, back precautions, log roll technique, ADLs, positioning, walking program etc. Pt has support from family at home. Will follow acutely to maximize independence and mobility prior to return home.     Follow Up Recommendations Home health PT;Supervision - Intermittent    Equipment Recommendations  None recommended by PT    Recommendations for Other Services       Precautions / Restrictions Precautions Precautions: Back;Fall Precaution Booklet Issued: Yes (comment) Precaution Comments: issued and reviewed Required Braces or Orthoses: Spinal Brace Spinal Brace: Thoracolumbosacral orthotic;Applied in sitting position;Applied in standing position Restrictions Weight Bearing Restrictions: No      Mobility  Bed Mobility Overal bed mobility: Needs Assistance Bed Mobility: Rolling;Sidelying to Sit Rolling: Supervision Sidelying to sit: Min guard     Sit to sidelying: Min guard General bed mobility comments: Good demo of  log roll technique, HOB flat, heavy use of rail to get to EOB. No assist needed.  Transfers Overall transfer level: Needs assistance Equipment used: Rolling walker (2 wheeled) Transfers: Sit to/from Stand Sit to Stand: Min guard         General transfer comment: Min guard for safety. Stood from EOB x2, first using walking sticks and then transferring to using RW due to instability.  Ambulation/Gait Ambulation/Gait assistance: Min guard Gait Distance (Feet): 250 Feet Assistive device: Rolling walker (2 wheeled) Gait Pattern/deviations: Step-through pattern;Decreased stride length;Trunk flexed;Wide base of support Gait velocity: decreased   General Gait Details: Slow, mostly steady gait with max cues for upright posture, flexed posture tended to get worse with increased distance. A few standing rest breaks. Heavy reliance on BUEs on RW. Attempted walking with walking sticks but pt with worsened flexed posture and very unsteady so aborted and moved to RW.  Stairs            Wheelchair Mobility    Modified Rankin (Stroke Patients Only)       Balance Overall balance assessment: Needs assistance Sitting-balance support: No upper extremity supported;Feet supported Sitting balance-Leahy Scale: Good     Standing balance support: During functional activity;Single extremity supported Standing balance-Leahy Scale: Poor Standing balance comment: overall reliant on at least single UE support for balance                             Pertinent Vitals/Pain Pain Assessment: Faces Faces Pain Scale: Hurts even more Pain Location: incisional with movement Pain Descriptors / Indicators: Guarding;Grimacing;Sore Pain Intervention(s): Repositioned;Monitored during session    Home Living Family/patient expects to be discharged to:: Private  residence Living Arrangements: Spouse/significant other;Children Available Help at Discharge: Family Type of Home: House Home Access:  Stairs to enter Entrance Stairs-Rails: Right Entrance Stairs-Number of Steps: 2 onto deck, 1 to enter home Home Layout: One level Home Equipment: Environmental consultantWalker - 2 wheels;Bedside commode;Shower seat;Wheelchair - power;Other (comment) Additional Comments: walking sticks    Prior Function Level of Independence: Independent with assistive device(s)         Comments: using two walking sticks for mobility, reports able to perform ADL without assist     Hand Dominance   Dominant Hand: Right    Extremity/Trunk Assessment   Upper Extremity Assessment Upper Extremity Assessment: Defer to OT evaluation LUE Deficits / Details: baseline weakness from previous SCI, pt digits tend to rest in flexed position, pt able to passively extend to place items in hand but reports with extended use would likely drop item due to weakness  LUE Coordination: decreased fine motor    Lower Extremity Assessment Lower Extremity Assessment: LLE deficits/detail;RLE deficits/detail;Generalized weakness RLE Deficits / Details: Baseline weakness from prior SCI- limited strength throughout BLEs, grossly ~2/5 hip flexion, 3/5 knee extension/flexion. RLE Coordination: decreased fine motor LLE Deficits / Details: Baseline weakness from prior SCI- limited strength throughout BLEs, grossly ~2/5 hip flexion, 3/5 knee extension/flexion. LLE Coordination: decreased fine motor    Cervical / Trunk Assessment Cervical / Trunk Assessment: Other exceptions Cervical / Trunk Exceptions: s/p spinal sx  Communication   Communication: No difficulties  Cognition Arousal/Alertness: Awake/alert Behavior During Therapy: WFL for tasks assessed/performed Overall Cognitive Status: Within Functional Limits for tasks assessed                                        General Comments      Exercises     Assessment/Plan    PT Assessment Patient needs continued PT services  PT Problem List Decreased strength;Decreased  mobility;Impaired tone;Decreased knowledge of precautions;Decreased coordination;Decreased range of motion;Decreased activity tolerance;Decreased skin integrity;Pain;Decreased balance       PT Treatment Interventions DME instruction;Therapeutic activities;Gait training;Therapeutic exercise;Patient/family education;Balance training;Stair training;Neuromuscular re-education;Functional mobility training    PT Goals (Current goals can be found in the Care Plan section)  Acute Rehab PT Goals Patient Stated Goal: return home, regain independence and strength, start strengthening program at a gym with a trainer PT Goal Formulation: With patient Time For Goal Achievement: 04/29/19 Potential to Achieve Goals: Good    Frequency Min 5X/week   Barriers to discharge        Co-evaluation               AM-PAC PT "6 Clicks" Mobility  Outcome Measure Help needed turning from your back to your side while in a flat bed without using bedrails?: A Little Help needed moving from lying on your back to sitting on the side of a flat bed without using bedrails?: A Little Help needed moving to and from a bed to a chair (including a wheelchair)?: A Little Help needed standing up from a chair using your arms (e.g., wheelchair or bedside chair)?: A Little Help needed to walk in hospital room?: A Little Help needed climbing 3-5 steps with a railing? : A Little 6 Click Score: 18    End of Session Equipment Utilized During Treatment: Back brace;Gait belt Activity Tolerance: Patient tolerated treatment well Patient left: in bed;with call bell/phone within reach(sitting EOB) Nurse Communication: Mobility status PT Visit Diagnosis:  Unsteadiness on feet (R26.81);Pain;Muscle weakness (generalized) (M62.81);Difficulty in walking, not elsewhere classified (R26.2) Pain - part of body: (incisional/back)    Time: 7622-6333 PT Time Calculation (min) (ACUTE ONLY): 32 min   Charges:   PT Evaluation $PT Eval  Moderate Complexity: 1 Mod PT Treatments $Gait Training: 8-22 mins        Mylo Red, PT, DPT Acute Rehabilitation Services Pager [phone] Office [phone]      Blake Divine A Lanier Ensign 04/15/2019, 10:55 AM

## 2019-04-16 DIAGNOSIS — M4316 Spondylolisthesis, lumbar region: Secondary | ICD-10-CM | POA: Diagnosis not present

## 2019-04-16 DIAGNOSIS — Z4789 Encounter for other orthopedic aftercare: Secondary | ICD-10-CM | POA: Diagnosis not present

## 2019-04-16 NOTE — Anesthesia Postprocedure Evaluation (Signed)
Anesthesia Post Note  Patient: [facility]  Procedure(s) Performed: LUMBAR FIVE - SACRUM ONE POSTERIOR SPINAL FUSION WITH REVISION. INSTRUMENTATION AND ALLOGRAFT (N/A )     Patient location during evaluation: PACU Anesthesia Type: General Level of consciousness: awake and alert Pain management: pain level controlled Vital Signs Assessment: post-procedure vital signs reviewed and stable Respiratory status: spontaneous breathing, nonlabored ventilation, respiratory function stable and patient connected to nasal cannula oxygen Cardiovascular status: blood pressure returned to baseline and stable Postop Assessment: no apparent nausea or vomiting Anesthetic complications: no    Last Vitals:  Vitals:   04/15/19 0412 04/15/19 0757  BP: 113/67 112/66  Pulse: 70 77  Resp: 20 16  Temp: 36.8 C 36.5 C  SpO2: 93% 100%    Last Pain:  Vitals:   04/15/19 1039  TempSrc:   PainSc: [place]

## 2019-04-19 DIAGNOSIS — M4316 Spondylolisthesis, lumbar region: Secondary | ICD-10-CM | POA: Diagnosis not present

## 2019-04-19 MED FILL — Sodium Chloride IV Soln 0.9%: INTRAVENOUS | Qty: 1000 | Status: AC

## 2019-04-19 MED FILL — Heparin Sodium (Porcine) Inj 1000 Unit/ML: INTRAMUSCULAR | Qty: 30 | Status: AC

## 2019-04-19 MED FILL — Sodium Chloride Irrigation Soln 0.9%: Qty: 3000 | Status: AC

## 2019-04-22 DIAGNOSIS — M4316 Spondylolisthesis, lumbar region: Secondary | ICD-10-CM | POA: Diagnosis not present

## 2019-04-26 DIAGNOSIS — M4316 Spondylolisthesis, lumbar region: Secondary | ICD-10-CM | POA: Diagnosis not present

## 2019-04-26 NOTE — Op Note (Signed)
    OPERATIVE REPORT  DATE OF SURGERY: 04/26/2019  PATIENT: Ralph [name], 53 y.o. male MRN: [id number]  DOB: 03/05/1966  PRE-OPERATIVE DIAGNOSIS: Degenerative disc disease  POST-OPERATIVE DIAGNOSIS:  Same  PROCEDURE: Anterior exposure for L5-S1 interbody fusion  SURGEON:  Curt Jews, M.D.  Co-surgeon for the exposure Dr. Phylliss Bob   ANESTHESIA: General  EBL: per anesthesia record  No intake/output data recorded.  BLOOD ADMINISTERED: none  DRAINS: none  SPECIMEN: none  COUNTS CORRECT:  YES  PATIENT DISPOSITION:  PACU - hemodynamically stable  PROCEDURE DETAILS: Patient was taken the operating placed supine position with area of the abdomen was prepped and draped in usual sterile fashion.  Crosstable lateral C arm was used to identify the level of the L5-S1 disc on the anterior surface of the skin.  Incision was made from the midline to the left and carried down through the subcutaneous fat with electrocautery.  The rectus muscle was exposed by opening the anterior rectus sheath in line with the skin incision.  The rectus muscle was mobilized circumferentially.  The retroperitoneal space was entered bluntly in the left lower quadrant and dissection was continued bluntly above the level of the psoas muscle.  The left ureter and intraperitoneal contents were mobilized to the right.  Dissection was trended down to the L5-S1 disc.  The iliac vessels were mobilized to the right and left of the disc.  The middle sacral vessels were clipped and divided.  Blunt dissection was continued to allow adequate exposure to the right and left of the L5-S1 disc.  The reverse lip 150 blades were positioned to the right and left of the disc and malleable retractors were used for superior and inferior exposure.  Spinal needle was placed in the L5-S1 disc and C-arm was brought back onto the field to confirm that this was the appropriate level.  The remainder the procedure will be dictated as a  separate note by Dr. Levora Dredge, M.D., [facility] 04/26/2019 10:34 AM

## 2019-04-28 DIAGNOSIS — M4316 Spondylolisthesis, lumbar region: Secondary | ICD-10-CM | POA: Diagnosis not present
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
## 2019-05-04 DIAGNOSIS — M4316 Spondylolisthesis, lumbar region: Secondary | ICD-10-CM | POA: Diagnosis not present

## 2019-05-14 DIAGNOSIS — M545 Low back pain: Secondary | ICD-10-CM | POA: Diagnosis not present

## 2019-05-14 DIAGNOSIS — R2689 Other abnormalities of gait and mobility: Secondary | ICD-10-CM | POA: Diagnosis not present

## 2019-05-14 DIAGNOSIS — M5410 Radiculopathy, site unspecified: Secondary | ICD-10-CM | POA: Diagnosis not present

## 2019-05-14 DIAGNOSIS — G629 Polyneuropathy, unspecified: Secondary | ICD-10-CM | POA: Diagnosis not present

## 2019-05-14 DIAGNOSIS — G8250 Quadriplegia, unspecified: Secondary | ICD-10-CM | POA: Diagnosis not present

## 2019-05-27 DIAGNOSIS — M4316 Spondylolisthesis, lumbar region: Secondary | ICD-10-CM | POA: Diagnosis not present

## 2019-05-30 ENCOUNTER — Telehealth: Payer: Self-pay | Admitting: *Deleted

## 2019-05-30 NOTE — Telephone Encounter (Signed)
Pt called, need another handicap placard, will like to stop by today to pick one up. Please call [phone]

## 2019-05-30 NOTE — Telephone Encounter (Signed)
[facility] handicap placard placed in MD's office to sign

## 2019-07-05 DIAGNOSIS — M5416 Radiculopathy, lumbar region: Secondary | ICD-10-CM | POA: Diagnosis not present

## 2019-07-05 DIAGNOSIS — Z9889 Other specified postprocedural states: Secondary | ICD-10-CM | POA: Diagnosis not present

## 2019-07-14 ENCOUNTER — Other Ambulatory Visit: Payer: Self-pay

## 2019-07-14 ENCOUNTER — Encounter: Payer: Self-pay | Admitting: Neurology

## 2019-07-14 ENCOUNTER — Ambulatory Visit (INDEPENDENT_AMBULATORY_CARE_PROVIDER_SITE_OTHER): Payer: Medicare PPO | Admitting: Neurology

## 2019-07-14 VITALS — BP 122/62 | HR 78 | Temp 97.7°F | Ht 72.0 in | Wt 230.2 lb

## 2019-07-14 DIAGNOSIS — G8250 Quadriplegia, unspecified: Secondary | ICD-10-CM | POA: Diagnosis not present

## 2019-07-14 DIAGNOSIS — R269 Unspecified abnormalities of gait and mobility: Secondary | ICD-10-CM

## 2019-07-14 MED ORDER — BACLOFEN 40 MG/20ML IT SOLN
40.0000 mg | Freq: Once | INTRATHECAL | Status: AC
  Administered 2019-07-14: 15:00:00 40 mg via INTRATHECAL

## 2019-07-14 NOTE — Progress Notes (Signed)
The patient comes in today for a baclofen pump refill.  He had lumbar spine surgery done on 13 April 2019, he is doing well following the surgery.

## 2019-07-14 NOTE — Procedures (Signed)
     History:  Ralph [name] is a 53 year old patient with a history of a cervical myelopathy.  The patient has a baclofen pump in place for spasticity in the legs.  He comes in for baclofen pump refill.  He has recently had lumbar spine surgery done on 13 April 2019, he has done well following this.  Indicates that his spasticity level in the legs is adequate.  Baclofen pump refill note  The baclofen pump site was cleaned with Betadine solution. A 21-gauge needle was inserted into the pump port site. Approximately 4 cc of residual baclofen was removed, the pump indicates a 3.8 cc residual. 20 cc of replacement baclofen was placed into the pump at 2000 mcg/cc concentration.  The pump was reprogrammed for the following settings: The pump was kept at a simple continuous rate of 210.0 mcg daily  The alarm volume is set at 1.5 cc. The next alarm date is January 06, 2020.  The patient tolerated the procedure well. There were no complications of the above procedure.  The [facility] number is [phone] The baclofen expiration date is October 2022. The baclofen lot number is [id number].

## 2019-08-01 DIAGNOSIS — Z6832 Body mass index (BMI) 32.0-32.9, adult: Secondary | ICD-10-CM | POA: Diagnosis not present

## 2019-08-01 DIAGNOSIS — Z1331 Encounter for screening for depression: Secondary | ICD-10-CM | POA: Diagnosis not present

## 2019-08-01 DIAGNOSIS — Z79899 Other long term (current) drug therapy: Secondary | ICD-10-CM | POA: Diagnosis not present

## 2019-08-01 DIAGNOSIS — I10 Essential (primary) hypertension: Secondary | ICD-10-CM | POA: Diagnosis not present

## 2019-08-01 DIAGNOSIS — K219 Gastro-esophageal reflux disease without esophagitis: Secondary | ICD-10-CM | POA: Diagnosis not present

## 2019-08-01 DIAGNOSIS — M62838 Other muscle spasm: Secondary | ICD-10-CM | POA: Diagnosis not present

## 2019-08-01 DIAGNOSIS — M109 Gout, unspecified: Secondary | ICD-10-CM | POA: Diagnosis not present

## 2019-10-06 ENCOUNTER — Other Ambulatory Visit: Payer: Self-pay | Admitting: Neurology

## 2019-10-10 DIAGNOSIS — M545 Low back pain: Secondary | ICD-10-CM | POA: Diagnosis not present

## 2019-10-10 DIAGNOSIS — M542 Cervicalgia: Secondary | ICD-10-CM | POA: Diagnosis not present

## 2019-10-19 ENCOUNTER — Other Ambulatory Visit: Payer: Self-pay

## 2019-10-19 ENCOUNTER — Other Ambulatory Visit: Payer: Self-pay | Admitting: Neurology

## 2019-10-19 ENCOUNTER — Telehealth: Payer: Self-pay | Admitting: Neurology

## 2019-10-19 MED ORDER — ESZOPICLONE 2 MG PO TABS: 2.0000 mg | ORAL_TABLET | Freq: Every evening | ORAL | 1 refills | Status: DC | PRN

## 2019-10-19 MED ORDER — TRAZODONE HCL 50 MG PO TABS: 50.0000 mg | ORAL_TABLET | Freq: Every day | ORAL | 2 refills | Status: DC

## 2019-10-19 NOTE — Telephone Encounter (Signed)
PT insurance no longer covering lunesta. Below are the alternate medications. Pharmacy Suggested Alternatives:   0 Suvorexant (BELSOMRA) 5 MG TABS 0 traZODone (DESYREL) 50 MG tablet  To prescribe one of the alternatives listed

## 2019-10-19 NOTE — Telephone Encounter (Signed)
The patient was getting Lunesta 3 mg tablets previously through this office, he claims that this resulted in some drowsiness first thing in the morning, will cut back to the 2 mg tablets if needed for sleep.

## 2019-10-19 NOTE — Telephone Encounter (Signed)
1) Medication(s) Requested (by name): Eszopiclone 3 MG TABS [Pharmacy Med Name: ESZOPICLONE 3 MG TABLET]   2) [facility]acy [facility] Choice: [facility] [address]   [phone]

## 2019-10-19 NOTE — Telephone Encounter (Signed)
Pt called stating he was advised by pharmacy that insurance Will no longer cover lunesta and pharmacy reccommended trazadone

## 2019-10-19 NOTE — Addendum Note (Signed)
Addended by: York Spaniel on: 10/19/2019 04:48 PM   Modules accepted: Orders

## 2019-10-19 NOTE — Telephone Encounter (Signed)
I called the patient.  The Ralph [name] is no longer covered through his insurance, I will try trazodone if needed for sleep.

## 2019-10-31 ENCOUNTER — Other Ambulatory Visit: Payer: Self-pay | Admitting: Neurology

## 2019-11-09 ENCOUNTER — Telehealth: Payer: Self-pay | Admitting: Neurology

## 2019-11-09 MED ORDER — TRAZODONE HCL 50 MG PO TABS: 150.0000 mg | ORAL_TABLET | Freq: Every day | ORAL | 3 refills | Status: DC

## 2019-11-09 NOTE — Addendum Note (Signed)
Addended by: York Spaniel on: 11/09/2019 04:21 PM   Modules accepted: Orders

## 2019-11-09 NOTE — Telephone Encounter (Signed)
I will go ahead and call in a prescription for trazodone 150 mg tablet at night.

## 2019-11-09 NOTE — Telephone Encounter (Signed)
Pt is asking for a call from RN to discuss the need of a refill on his traZODone (DESYREL) 50 MG tablet.  Pt states Dr Anne Hahn told him that if 1 doesn't work take 2 and if 2 don't work take 3. Pt from time to time has to take 2 or 3 to get to sleep.  Pt is asking for a 90 day script or a higher mg, please call to discuss.  Pt will use [id number]

## 2019-11-09 NOTE — Telephone Encounter (Signed)
I call pt that work in [place] would like for [name] to discuss increasing trazodone to 3 pills per month at 150mg  dosage. Pt stated Dr.WIlls told him to increase to 2 or 3 in needed. Pt stated some nights he took 2 and other nights three. He stated the three pills of trazodone at 50mg  work better for him for sleep.. I stated message will be sent to Dr . to review on Monday. Pt verbalized understanding.

## 2019-11-09 NOTE — Telephone Encounter (Signed)
I spoke with Dr.Sethi about pt wanting his trazodone increase to 3 pills per night. Dr Pearlean Brownie stated he will let [name] do the adjustment on Monday because there is no current office note stating pt can adjust as needed.. Pt will be call.

## 2019-11-15 DIAGNOSIS — E669 Obesity, unspecified: Secondary | ICD-10-CM | POA: Diagnosis not present

## 2019-11-15 DIAGNOSIS — L0232 Furuncle of buttock: Secondary | ICD-10-CM | POA: Diagnosis not present

## 2019-11-15 DIAGNOSIS — Z6832 Body mass index (BMI) 32.0-32.9, adult: Secondary | ICD-10-CM | POA: Diagnosis not present

## 2019-11-20 IMAGING — RF DG LUMBAR SPINE 2-3V
1 series · 2 of 2 positions shown · non-contrast
Comparison: 11/13/2016

CLINICAL DATA: L5-S1 fusion

EXAM:
LUMBAR SPINE - 2-3 VIEW; DG C-ARM 1-60 MIN

[Series 1: run · 2 of 2 slices shown]
[im 1/2]
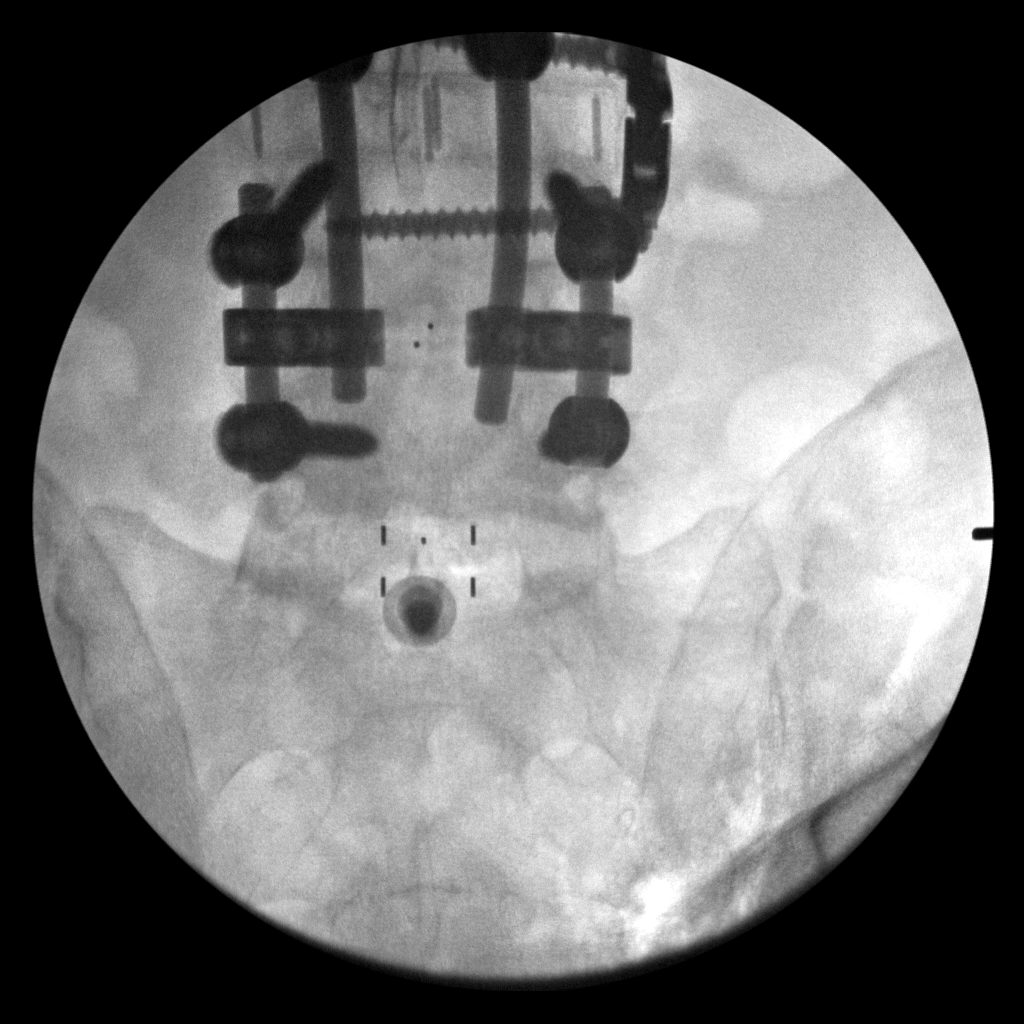
[im 2/2]
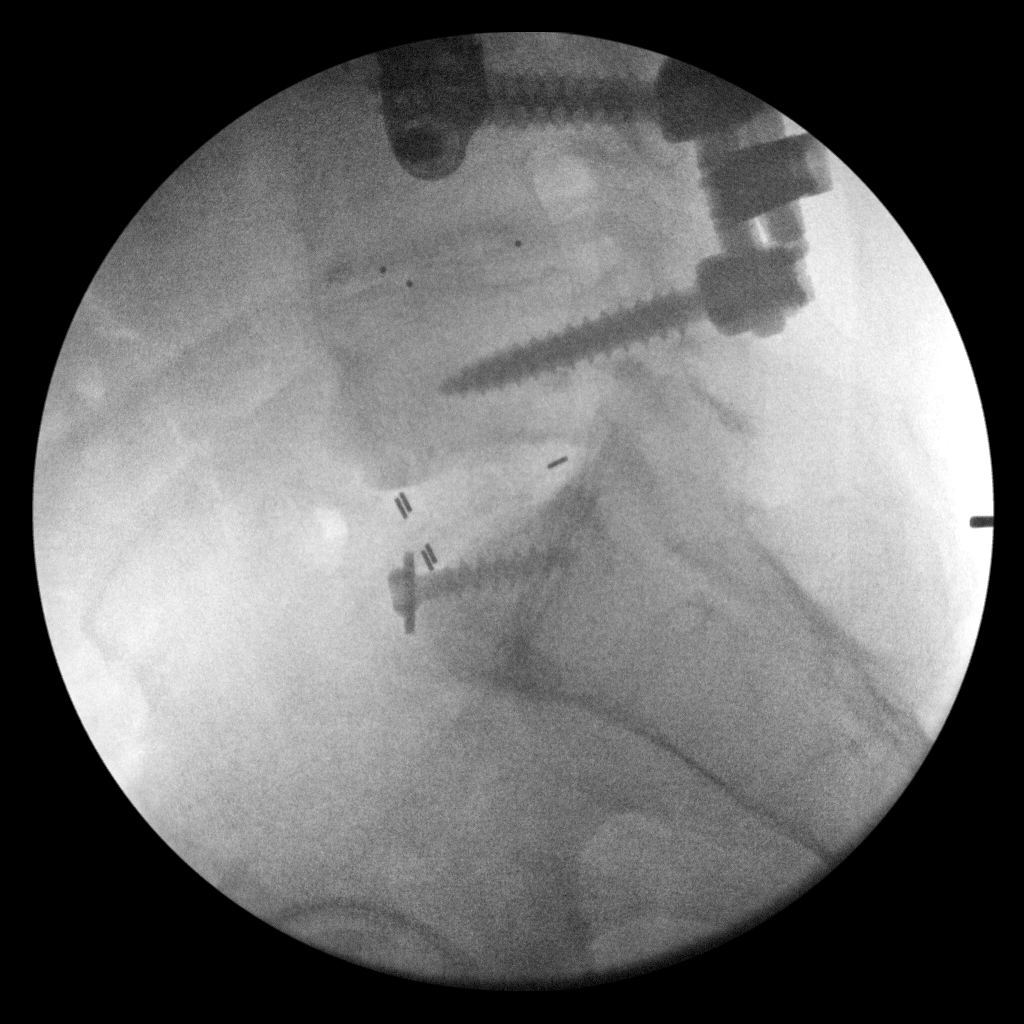

[2 of 2 positions shown; findings below may reference images not displayed]

FLUOROSCOPY TIME:  Radiation Exposure Index (as provided by the
fluoroscopic device): Not available

If the device does not provide the exposure index:

Fluoroscopy Time:  31 seconds

Number of Acquired Images:  2
FINDINGS: Interbody fusion is now seen at L5-S1 with screw fixation. Changes
of prior fusion at L4-5 and L3-4 are noted as well.
IMPRESSION: Status post L5-S1 fusion.

## 2019-11-21 IMAGING — RF DG LUMBAR SPINE 2-3V
1 series · 2 of 2 positions shown · IV contrast (agent unspecified)
Comparison: 11/13/2016 and 04/13/2019

CLINICAL DATA: L5-S1 posterior spinal fusion.

EXAM:
DG C-ARM 1-60 MIN; LUMBAR SPINE - 2-3 VIEW
CONTRAST:  None.
FLUOROSCOPY TIME:  Fluoroscopy Time:  1 minutes 25 seconds
Radiation Exposure Index (if provided by the fluoroscopic device):
Number of Acquired Spot Images: 2

[Series 1: run · 2 of 2 slices shown]
[im 1/2]
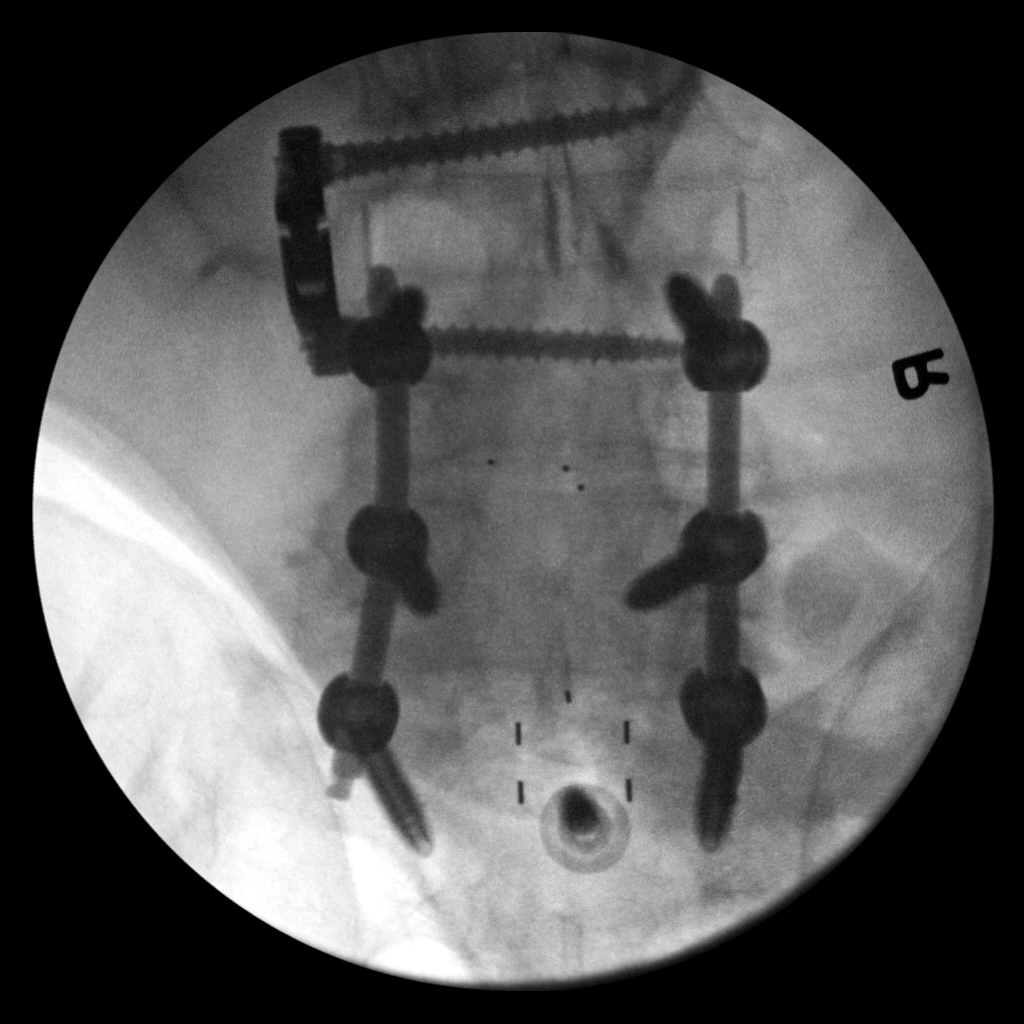
[im 2/2]
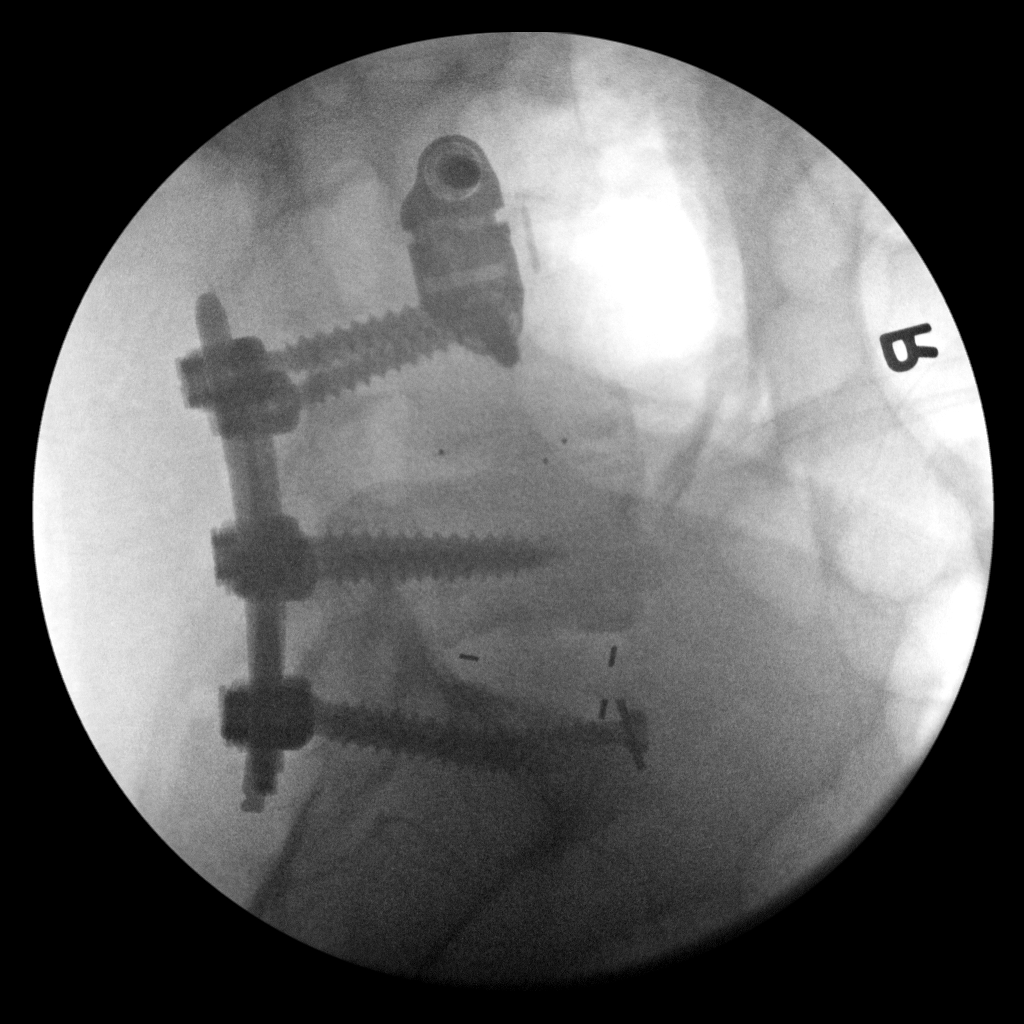

[2 of 2 positions shown; findings below may reference images not displayed]

FINDINGS: Exam demonstrates posterior spinal fusion hardware from L4-S1
intact. Left lateral plate with associated screws at the L3-4 level.
Interbody fusion from the L3-4 level to the L5-S1 level. Anterior to
posterior screw along the superior endplate of S1 unchanged.
IMPRESSION: Postsurgical hardware intact and unchanged as described.

## 2019-11-21 IMAGING — RF DG C-ARM 1-60 MIN
1 series · 2 of 2 positions shown · IV contrast (agent unspecified)
Comparison: 11/13/2016 and 04/13/2019

CLINICAL DATA: L5-S1 posterior spinal fusion.

EXAM:
DG C-ARM 1-60 MIN; LUMBAR SPINE - 2-3 VIEW
CONTRAST:  None.
FLUOROSCOPY TIME:  Fluoroscopy Time:  1 minutes 25 seconds
Radiation Exposure Index (if provided by the fluoroscopic device):
Number of Acquired Spot Images: 2

[Series 1: run · 2 of 2 slices shown]
[im 1/2]
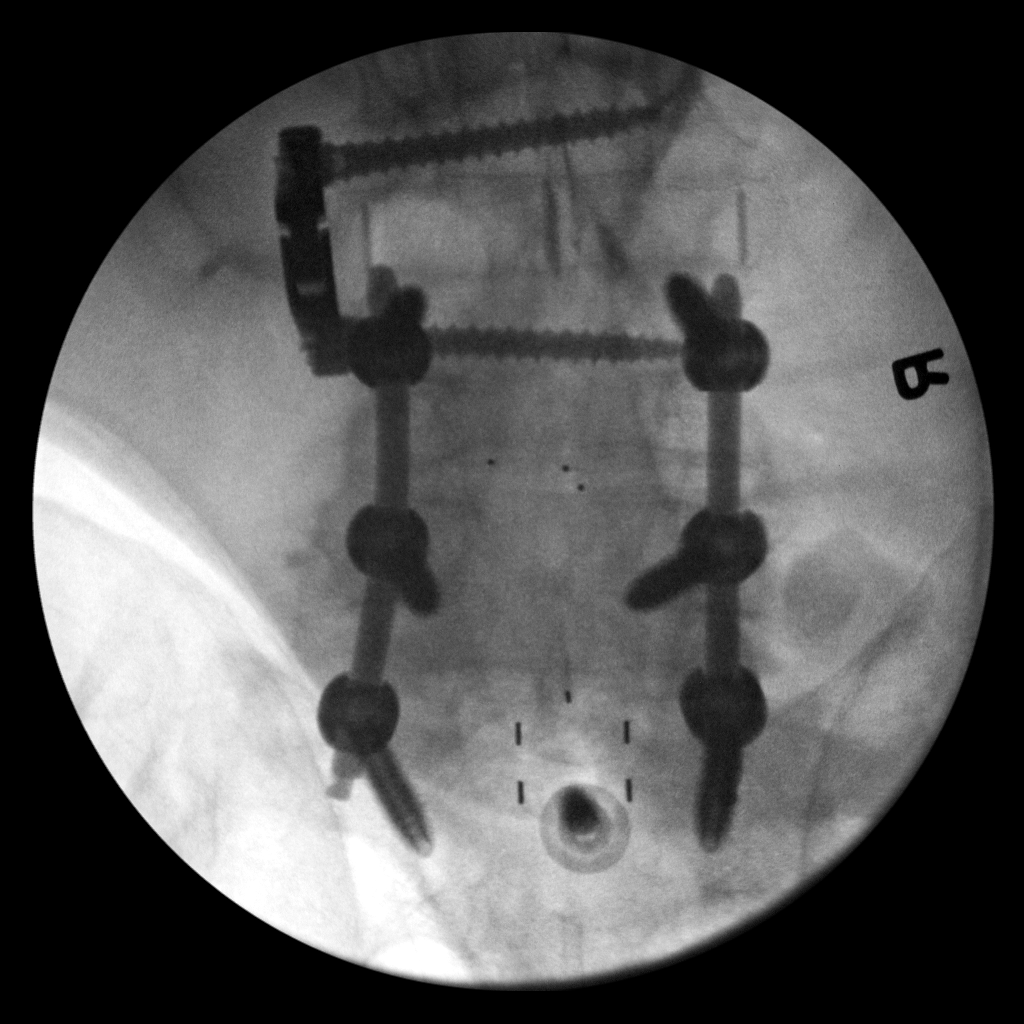
[im 2/2]
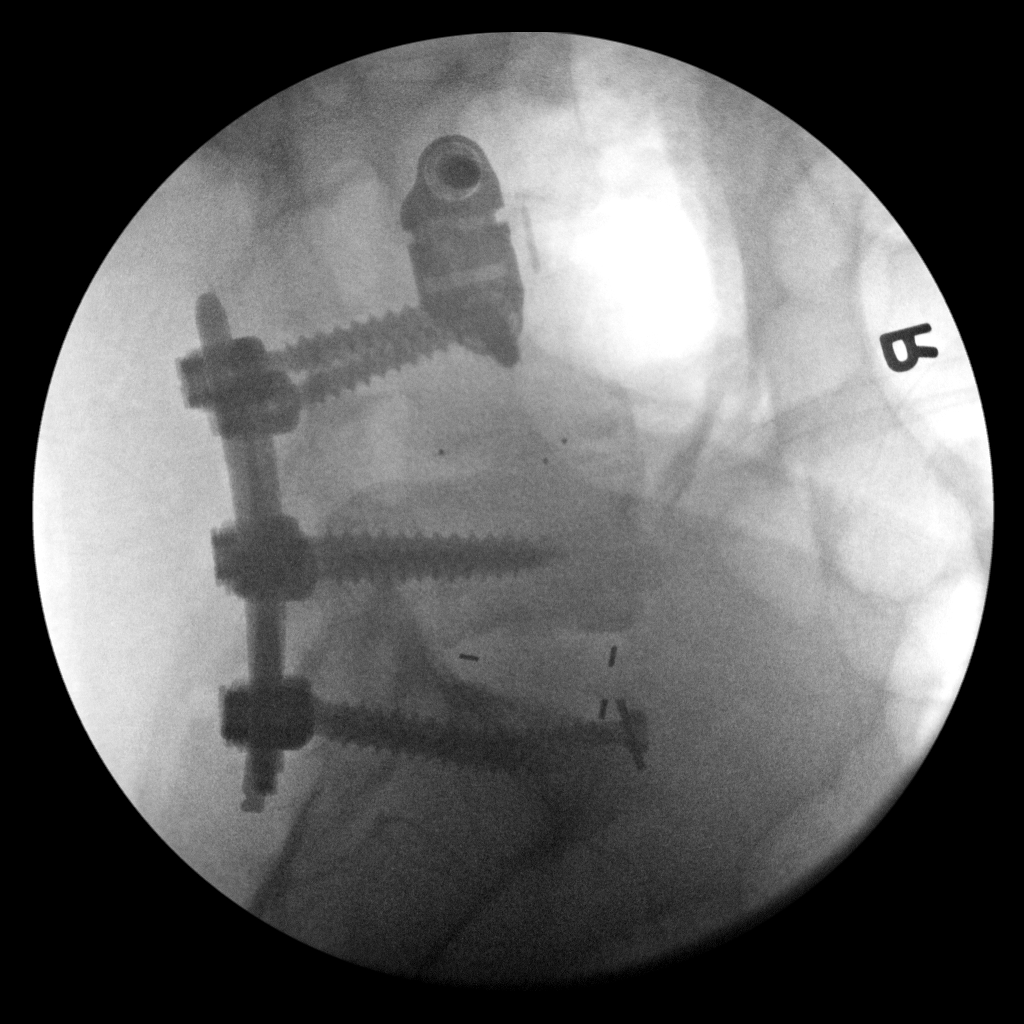

[2 of 2 positions shown; findings below may reference images not displayed]

FINDINGS: Exam demonstrates posterior spinal fusion hardware from L4-S1
intact. Left lateral plate with associated screws at the L3-4 level.
Interbody fusion from the L3-4 level to the L5-S1 level. Anterior to
posterior screw along the superior endplate of S1 unchanged.
IMPRESSION: Postsurgical hardware intact and unchanged as described.

## 2019-12-15 ENCOUNTER — Other Ambulatory Visit: Payer: Self-pay

## 2019-12-15 ENCOUNTER — Encounter: Payer: Self-pay | Admitting: Neurology

## 2019-12-15 ENCOUNTER — Ambulatory Visit: Payer: Medicare PPO | Admitting: Neurology

## 2019-12-15 VITALS — BP 113/74 | HR 68 | Temp 97.2°F | Ht 72.0 in | Wt 234.5 lb

## 2019-12-15 DIAGNOSIS — G8250 Quadriplegia, unspecified: Secondary | ICD-10-CM | POA: Diagnosis not present

## 2019-12-15 MED ORDER — BACLOFEN 40 MG/20ML IT SOLN
40.0000 mg | Freq: Once | INTRATHECAL | Status: AC
  Administered 2019-12-15: 40 mg via INTRATHECAL

## 2019-12-15 NOTE — Procedures (Signed)
      History:  Ralph [name] is a 54 year old patient with a history of a spinal cord injury with a spastic quadriparesis.  The patient is doing relatively well with the baclofen pump.  He does not desire to have any changes in the settings.  Baclofen pump refill note  The baclofen pump site was cleaned with Betadine solution. A 21-gauge needle was inserted into the pump port site. Approximately 4.0 cc of residual baclofen was removed, the pump indicates a 3.9cc residual. 20 cc of replacement baclofen was placed into the pump at 2000 mcg/cc concentration.  The pump was reprogrammed for the following settings: The pump was maintained at a simple continuous rate of 210 mcg/day.  The alarm volume is set at 1.5 cc. The next alarm date is June 08, 2020.  The patient tolerated the procedure well. There were no complications of the above procedure.  The [facility] number is [phone] The baclofen expiration date is December 2022. The baclofen lot number is [id number].

## 2019-12-15 NOTE — Progress Notes (Signed)
Please refer to baclofen procedure note. 

## 2020-01-03 DIAGNOSIS — N3090 Cystitis, unspecified without hematuria: Secondary | ICD-10-CM | POA: Diagnosis not present

## 2020-01-03 DIAGNOSIS — N3001 Acute cystitis with hematuria: Secondary | ICD-10-CM | POA: Diagnosis not present

## 2020-01-19 ENCOUNTER — Other Ambulatory Visit: Payer: Self-pay | Admitting: Neurology

## 2020-03-23 DIAGNOSIS — G4733 Obstructive sleep apnea (adult) (pediatric): Secondary | ICD-10-CM | POA: Diagnosis not present

## 2020-03-28 ENCOUNTER — Encounter: Payer: Self-pay | Admitting: Neurology

## 2020-03-29 ENCOUNTER — Ambulatory Visit: Payer: Medicare PPO | Admitting: Adult Health

## 2020-03-29 VITALS — BP 103/69 | HR 63 | Ht 72.0 in | Wt 235.0 lb

## 2020-03-29 DIAGNOSIS — Z9989 Dependence on other enabling machines and devices: Secondary | ICD-10-CM

## 2020-03-29 DIAGNOSIS — G4733 Obstructive sleep apnea (adult) (pediatric): Secondary | ICD-10-CM

## 2020-03-29 NOTE — Patient Instructions (Signed)
Continue using CPAP nightly and greater than 4 hours each night °If your symptoms worsen or you develop new symptoms please let us know.  ° °
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
## 2020-04-04 ENCOUNTER — Other Ambulatory Visit: Payer: Self-pay | Admitting: Neurology

## 2020-05-12 DIAGNOSIS — R051 Acute cough: Secondary | ICD-10-CM | POA: Diagnosis not present

## 2020-05-12 DIAGNOSIS — Z20828 Contact with and (suspected) exposure to other viral communicable diseases: Secondary | ICD-10-CM | POA: Diagnosis not present

## 2020-05-28 ENCOUNTER — Encounter: Payer: Self-pay | Admitting: Neurology

## 2020-05-28 ENCOUNTER — Telehealth: Payer: Self-pay | Admitting: Neurology

## 2020-05-28 ENCOUNTER — Ambulatory Visit: Payer: Medicare PPO | Admitting: Neurology

## 2020-05-28 VITALS — BP 107/67 | Ht 72.0 in | Wt 236.5 lb

## 2020-05-28 DIAGNOSIS — G8250 Quadriplegia, unspecified: Secondary | ICD-10-CM

## 2020-05-28 MED ORDER — BACLOFEN 40 MG/20ML IT SOLN
40.0000 mg | Freq: Once | INTRATHECAL | Status: AC
  Administered 2020-05-28: 40 mg via INTRATHECAL

## 2020-05-28 MED ORDER — TADALAFIL 20 MG PO TABS: 20.0000 mg | ORAL_TABLET | Freq: Every day | ORAL | 3 refills | Status: AC | PRN

## 2020-05-28 NOTE — Procedures (Signed)
     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  The patient has a baclofen pump in place, he is coming in for a pump refill.  He has been doing fairly well with his spasticity level since last seen.  Baclofen pump refill note  The baclofen pump site was cleaned with Betadine solution. A 21-gauge needle was inserted into the pump port site. Approximately 3.5 cc of residual baclofen was removed, the pump indicates a 2.7 cc residual. 20 cc of replacement baclofen was placed into the pump at 2000 mcg/cc concentration.  The pump was reprogrammed for the following settings: The pump was kept at a simple continuous rate of 210.0 mcg/day.  The alarm volume is set at 1.5 cc. The next alarm date is November 20, 2020.  The patient tolerated the procedure well. There were no complications of the above procedure.  The NDC number is [id number] The baclofen expiration date is December 2022. The baclofen lot number is [id number].

## 2020-05-28 NOTE — Telephone Encounter (Signed)
Pt called wanting to know if he can have something to help him sleep better at night. Pt states that the medications he has been given are not helping. Please advise.

## 2020-05-28 NOTE — Telephone Encounter (Signed)
Left message for patient to discuss difficulty with sleeping and medication request.

## 2020-05-29 MED ORDER — ESZOPICLONE 3 MG PO TABS: 3.0000 mg | ORAL_TABLET | Freq: Every day | ORAL | 1 refills | Status: DC

## 2020-05-29 NOTE — Telephone Encounter (Signed)
I called the patient.  The patient was taken 200 mg of trazodone in the evening without much benefit.  He also is on 150 mg of nortriptyline, probably cannot go higher on the trazodone.  He has stopped the trazodone, we will retry the Lunesta.  Hopefully the insurance may pay for this medication now.

## 2020-05-29 NOTE — Addendum Note (Signed)
Addended by: York Spaniel on: 05/29/2020 10:43 AM   Modules accepted: Orders

## 2020-05-29 NOTE — Telephone Encounter (Signed)
Called patient and he is concerned the trazodone is not working to help him sleep, he has taken up to 4 tablets at night and still not getting any sleep.  Is asking if he can take 2 diazepam at night to help him sleep,  and not take trazodone? Has not tried taking 2 diazepam, only taken 1 at night and will keep him sleeping for about 4 hours.  Hoping 2 will help him sleep longer.    Stated Alfonso Patten helped him sleep in the past but quit taking it because his insurance quit paying for it.  [facility] [place]?  So 2 questions.Marland KitchenMarland KitchenMarland Kitchen

## 2020-06-18 ENCOUNTER — Encounter: Payer: Self-pay | Admitting: General Practice

## 2020-06-18 ENCOUNTER — Telehealth: Payer: Self-pay | Admitting: Cardiology

## 2020-06-18 NOTE — Telephone Encounter (Signed)
  Recall expunge letter sent for overdue f/u 

## 2020-06-20 DIAGNOSIS — N529 Male erectile dysfunction, unspecified: Secondary | ICD-10-CM | POA: Diagnosis not present

## 2020-06-20 DIAGNOSIS — Z125 Encounter for screening for malignant neoplasm of prostate: Secondary | ICD-10-CM | POA: Diagnosis not present

## 2020-06-20 DIAGNOSIS — Z6832 Body mass index (BMI) 32.0-32.9, adult: Secondary | ICD-10-CM | POA: Diagnosis not present

## 2020-06-20 DIAGNOSIS — I10 Essential (primary) hypertension: Secondary | ICD-10-CM | POA: Diagnosis not present

## 2020-06-20 DIAGNOSIS — K219 Gastro-esophageal reflux disease without esophagitis: Secondary | ICD-10-CM | POA: Diagnosis not present

## 2020-06-20 DIAGNOSIS — M109 Gout, unspecified: Secondary | ICD-10-CM | POA: Diagnosis not present

## 2020-06-20 DIAGNOSIS — Z1331 Encounter for screening for depression: Secondary | ICD-10-CM | POA: Diagnosis not present

## 2020-06-20 DIAGNOSIS — Z0000 Encounter for general adult medical examination without abnormal findings: Secondary | ICD-10-CM | POA: Diagnosis not present

## 2020-07-31 ENCOUNTER — Other Ambulatory Visit: Payer: Self-pay | Admitting: Emergency Medicine

## 2020-07-31 ENCOUNTER — Telehealth: Payer: Self-pay

## 2020-07-31 MED ORDER — GABAPENTIN 800 MG PO TABS: 800.0000 mg | ORAL_TABLET | Freq: Three times a day (TID) | ORAL | 0 refills | Status: DC

## 2020-07-31 MED ORDER — GABAPENTIN 800 MG PO TABS: 800.0000 mg | ORAL_TABLET | Freq: Three times a day (TID) | ORAL | 3 refills | Status: DC

## 2020-07-31 NOTE — Telephone Encounter (Signed)
Called patient, [facility] [place] and a 30 day with refills to the [facility] effective 08/30/20.  Patient expressed appreciation.

## 2020-07-31 NOTE — Telephone Encounter (Signed)
Patient did not realize that he was almost out of his Gabapentin and needs a small supply sent to [facility] and needs a 90 day supply sent to [facility] mail order pharmacy. I confirmed his dosage.

## 2020-11-15 ENCOUNTER — Other Ambulatory Visit: Payer: Self-pay

## 2020-11-15 ENCOUNTER — Ambulatory Visit: Payer: Medicare PPO | Admitting: Neurology

## 2020-11-15 ENCOUNTER — Encounter: Payer: Self-pay | Admitting: Neurology

## 2020-11-15 VITALS — Ht 72.0 in | Wt 230.0 lb

## 2020-11-15 DIAGNOSIS — G8250 Quadriplegia, unspecified: Secondary | ICD-10-CM

## 2020-11-15 MED ORDER — BACLOFEN 40 MG/20ML IT SOLN
40.0000 mg | Freq: Once | INTRATHECAL | Status: AC
Start: 2020-11-15 — End: 2020-11-15
  Administered 2020-11-15: 40 mg via INTRATHECAL

## 2020-11-15 NOTE — Progress Notes (Signed)
Please refer to baclofen procedure note. 

## 2020-11-15 NOTE — Procedures (Signed)
     History:  Ralph [name] is a 55 year old gentleman with a history of a cervical myelopathy with a spastic quadriparesis.  The patient has a baclofen pump in place and comes in for baclofen pump refill.  He has done well with his current level of dosing.  Baclofen pump refill note  The baclofen pump site was cleaned with Betadine solution. A 21-gauge needle was inserted into the pump port site. Approximately 2 cc of residual baclofen was removed, the pump indicates a 2.1 cc residual. 20 cc of replacement baclofen was placed into the pump at 2000 mcg/cc concentration.  The pump was reprogrammed for the following settings: The patient was kept at a simple continuous rate getting 210.0 micrograms daily.  The alarm volume is set at 1.5 cc. The next alarm date is May 10, 2021.  The patient tolerated the procedure well. There were no complications of the above procedure.  The NDC number is [phone] The baclofen expiration date is May 2024. The baclofen lot number is [id number].

## 2020-11-27 DIAGNOSIS — G4733 Obstructive sleep apnea (adult) (pediatric): Secondary | ICD-10-CM | POA: Diagnosis not present

## 2021-01-03 ENCOUNTER — Other Ambulatory Visit: Payer: Self-pay | Admitting: Neurology

## 2021-02-01 DIAGNOSIS — K219 Gastro-esophageal reflux disease without esophagitis: Secondary | ICD-10-CM | POA: Diagnosis not present

## 2021-02-01 DIAGNOSIS — Z6828 Body mass index (BMI) 28.0-28.9, adult: Secondary | ICD-10-CM | POA: Diagnosis not present

## 2021-02-01 DIAGNOSIS — I10 Essential (primary) hypertension: Secondary | ICD-10-CM | POA: Diagnosis not present

## 2021-02-01 DIAGNOSIS — N529 Male erectile dysfunction, unspecified: Secondary | ICD-10-CM | POA: Diagnosis not present

## 2021-02-01 DIAGNOSIS — Z79899 Other long term (current) drug therapy: Secondary | ICD-10-CM | POA: Diagnosis not present

## 2021-02-01 DIAGNOSIS — M62838 Other muscle spasm: Secondary | ICD-10-CM | POA: Diagnosis not present

## 2021-02-01 DIAGNOSIS — M109 Gout, unspecified: Secondary | ICD-10-CM | POA: Diagnosis not present

## 2021-04-01 ENCOUNTER — Ambulatory Visit: Payer: Medicare PPO | Admitting: Adult Health

## 2021-04-03 ENCOUNTER — Ambulatory Visit: Payer: Medicare PPO | Admitting: Neurology

## 2021-04-05 DIAGNOSIS — G4733 Obstructive sleep apnea (adult) (pediatric): Secondary | ICD-10-CM | POA: Diagnosis not present

## 2021-04-29 ENCOUNTER — Other Ambulatory Visit: Payer: Self-pay

## 2021-04-29 ENCOUNTER — Encounter: Payer: Self-pay | Admitting: Neurology

## 2021-04-29 ENCOUNTER — Ambulatory Visit: Payer: Medicare PPO | Admitting: Neurology

## 2021-04-29 VITALS — BP 118/76 | HR 97 | Ht 72.0 in | Wt 227.0 lb

## 2021-04-29 DIAGNOSIS — G8250 Quadriplegia, unspecified: Secondary | ICD-10-CM

## 2021-04-29 MED ORDER — BACLOFEN 40 MG/20ML IT SOLN
40.0000 mg | Freq: Once | INTRATHECAL | Status: AC
  Administered 2021-04-29: 40 mg via INTRATHECAL

## 2021-04-29 NOTE — Procedures (Signed)
     History:  Ralph [name] is a 55 year old gentleman with a cervical myelopathy with a spastic paraparesis.  The patient has a baclofen pump in place, comes in for another pump refill.  He has done well since last seen, he may fall on occasion.  Baclofen pump refill note  The baclofen pump site was cleaned with Betadine solution. A 21-gauge needle was inserted into the pump port site. Approximately 4 cc of residual baclofen was removed, the pump indicates a 2.7 cc residual. 20 cc of replacement baclofen was placed into the pump at 2000 mcg/cc concentration.  The pump was reprogrammed for the following settings: The pump was kept at a simple continuous rate of 210.0 mcg/day.  The alarm volume is set at 1.5 cc. The next alarm date is October 22, 2021.  The patient tolerated the procedure well. There were no complications of the above procedure.  The NDC number is [phone] The baclofen expiration date is June 2023. The baclofen lot number is [id number].

## 2021-04-29 NOTE — Progress Notes (Signed)
Please refer to baclofen pump procedure note. 

## 2021-04-30 ENCOUNTER — Telehealth: Payer: Self-pay

## 2021-04-30 NOTE — Telephone Encounter (Signed)
-----   Message from York Spaniel, MD sent at 04/29/2021  8:44 AM EDT ----- Need to make sure this patient has a revisit with Dr. Epimenio Foot for a pump refill, next visit needs to be before 22 October 2021.

## 2021-04-30 NOTE — Telephone Encounter (Signed)
I called the pt about this message. Pt scheduled follow up appointment at 10/17/21 at check. This is labeled as a TOC visit to Dr. Epimenio Foot. Pt wanted to know if his baclofen pump refill could be handled at this time? I advised I would send message to Dr. Bonnita Hollow pod and we would be back in touch.

## 2021-04-30 NOTE — Telephone Encounter (Signed)
Called the patient and asked if we could move his visit to Monday at 1:30 pm with check in of 1 pm. His alarm date is 10/22/20. Advised that with this being a TOC we wanted to ensure we had adequate time to do a visit and refill. Pt verbalized understanding.

## 2021-05-07 ENCOUNTER — Ambulatory Visit: Payer: Medicare PPO | Admitting: Adult Health

## 2021-05-07 ENCOUNTER — Encounter: Payer: Self-pay | Admitting: Adult Health

## 2021-05-07 VITALS — BP 135/88 | HR 72

## 2021-05-07 DIAGNOSIS — G4733 Obstructive sleep apnea (adult) (pediatric): Secondary | ICD-10-CM | POA: Diagnosis not present

## 2021-05-07 DIAGNOSIS — Z9989 Dependence on other enabling machines and devices: Secondary | ICD-10-CM | POA: Diagnosis not present

## 2021-05-07 NOTE — Patient Instructions (Signed)
Continue using CPAP nightly and greater than 4 hours each night °If your symptoms worsen or you develop new symptoms please let us know.  ° °
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
## 2021-05-22 DIAGNOSIS — D485 Neoplasm of uncertain behavior of skin: Secondary | ICD-10-CM | POA: Diagnosis not present

## 2021-05-22 DIAGNOSIS — L920 Granuloma annulare: Secondary | ICD-10-CM | POA: Diagnosis not present

## 2021-06-12 DIAGNOSIS — D485 Neoplasm of uncertain behavior of skin: Secondary | ICD-10-CM | POA: Diagnosis not present

## 2021-07-10 DIAGNOSIS — Z6833 Body mass index (BMI) 33.0-33.9, adult: Secondary | ICD-10-CM | POA: Diagnosis not present

## 2021-07-10 DIAGNOSIS — Z1331 Encounter for screening for depression: Secondary | ICD-10-CM | POA: Diagnosis not present

## 2021-07-10 DIAGNOSIS — Z0000 Encounter for general adult medical examination without abnormal findings: Secondary | ICD-10-CM | POA: Diagnosis not present

## 2021-07-10 DIAGNOSIS — G4734 Idiopathic sleep related nonobstructive alveolar hypoventilation: Secondary | ICD-10-CM | POA: Diagnosis not present

## 2021-07-11 ENCOUNTER — Other Ambulatory Visit: Payer: Self-pay

## 2021-07-11 MED ORDER — GABAPENTIN 800 MG PO TABS: 800.0000 mg | ORAL_TABLET | Freq: Three times a day (TID) | ORAL | 3 refills | Status: DC

## 2021-07-11 MED ORDER — NORTRIPTYLINE HCL 75 MG PO CAPS: 150.0000 mg | ORAL_CAPSULE | Freq: Every day | ORAL | 0 refills | Status: DC

## 2021-08-06 DIAGNOSIS — E162 Hypoglycemia, unspecified: Secondary | ICD-10-CM | POA: Diagnosis not present

## 2021-08-06 DIAGNOSIS — Z6833 Body mass index (BMI) 33.0-33.9, adult: Secondary | ICD-10-CM | POA: Diagnosis not present

## 2021-08-06 DIAGNOSIS — R110 Nausea: Secondary | ICD-10-CM | POA: Diagnosis not present

## 2021-09-25 DIAGNOSIS — D485 Neoplasm of uncertain behavior of skin: Secondary | ICD-10-CM | POA: Diagnosis not present

## 2021-10-14 ENCOUNTER — Ambulatory Visit: Payer: Medicare PPO | Admitting: Neurology

## 2021-10-14 ENCOUNTER — Encounter: Payer: Self-pay | Admitting: Neurology

## 2021-10-14 VITALS — BP 124/84 | HR 71 | Ht 72.0 in | Wt 242.0 lb

## 2021-10-14 DIAGNOSIS — M5416 Radiculopathy, lumbar region: Secondary | ICD-10-CM | POA: Diagnosis not present

## 2021-10-14 DIAGNOSIS — G4733 Obstructive sleep apnea (adult) (pediatric): Secondary | ICD-10-CM | POA: Diagnosis not present

## 2021-10-14 DIAGNOSIS — S14105S Unspecified injury at C5 level of cervical spinal cord, sequela: Secondary | ICD-10-CM

## 2021-10-14 DIAGNOSIS — Z9989 Dependence on other enabling machines and devices: Secondary | ICD-10-CM

## 2021-10-14 DIAGNOSIS — M48062 Spinal stenosis, lumbar region with neurogenic claudication: Secondary | ICD-10-CM | POA: Diagnosis not present

## 2021-10-14 DIAGNOSIS — S14105A Unspecified injury at C5 level of cervical spinal cord, initial encounter: Secondary | ICD-10-CM | POA: Insufficient documentation

## 2021-10-14 DIAGNOSIS — Z978 Presence of other specified devices: Secondary | ICD-10-CM

## 2021-10-14 DIAGNOSIS — G8250 Quadriplegia, unspecified: Secondary | ICD-10-CM | POA: Diagnosis not present

## 2021-10-14 MED ORDER — ROPINIROLE HCL 0.5 MG PO TABS: ORAL_TABLET | ORAL | 5 refills | Status: DC
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
## 2021-10-14 NOTE — Progress Notes (Signed)
Skin prepped with Betadine and draped in sterile fashion. Center port of implanted pump accessed using Huber needle. Residual fluid measuring 3 ml removed from reservoir.  Medication from [facility] pharmacy instilled into pump.  Pump reprogrammed to reflect volume of 28ml.  New alarm date is 03/16/2022 ?Appt. for r/f given for 03/17/2022 at 1:00 pm. Appt reminder given and pt entered in phone. ?Estimated ERI: July 2026 ?

## 2021-10-17 ENCOUNTER — Ambulatory Visit: Payer: Medicare PPO | Admitting: Neurology

## 2021-11-01 ENCOUNTER — Other Ambulatory Visit: Payer: Self-pay | Admitting: Neurology

## 2021-12-23 ENCOUNTER — Telehealth: Payer: Self-pay

## 2021-12-23 MED ORDER — ROPINIROLE HCL 0.5 MG PO TABS
ORAL_TABLET | ORAL | 11 refills | Status: DC
Start: 2021-12-23 — End: 2022-03-17

## 2021-12-23 NOTE — Telephone Encounter (Signed)
Called back. He called pharmacist over the weekend. Told he was on lowest dose of requip and should be ok to increase but that he needed to discuss with prescribing MD first. He would like a dose increase as he feels RLS sx worse. He tried taking requip 0.5mg , 1 in the am, 1 at 3pm, and 2 at bedtime. This worked well. Aware I will send to MD to review and call back with recommendation from Dr. Epimenio Foot. He verbalized understanding.  Pharmacy : [id number] - [address], [place] - [address] [facility]

## 2021-12-23 NOTE — Telephone Encounter (Signed)
Patient left a voicemail this morning asking for a call to discuss an increase in ropinirole.

## 2022-02-12 DIAGNOSIS — G4733 Obstructive sleep apnea (adult) (pediatric): Secondary | ICD-10-CM | POA: Diagnosis not present

## 2022-02-12 DIAGNOSIS — Z6833 Body mass index (BMI) 33.0-33.9, adult: Secondary | ICD-10-CM | POA: Diagnosis not present

## 2022-02-12 DIAGNOSIS — G2581 Restless legs syndrome: Secondary | ICD-10-CM | POA: Diagnosis not present

## 2022-02-12 DIAGNOSIS — G4734 Idiopathic sleep related nonobstructive alveolar hypoventilation: Secondary | ICD-10-CM | POA: Diagnosis not present

## 2022-03-03 DIAGNOSIS — M9904 Segmental and somatic dysfunction of sacral region: Secondary | ICD-10-CM | POA: Diagnosis not present

## 2022-03-03 DIAGNOSIS — M5388 Other specified dorsopathies, sacral and sacrococcygeal region: Secondary | ICD-10-CM | POA: Diagnosis not present

## 2022-03-03 DIAGNOSIS — M9902 Segmental and somatic dysfunction of thoracic region: Secondary | ICD-10-CM | POA: Diagnosis not present

## 2022-03-03 DIAGNOSIS — M4725 Other spondylosis with radiculopathy, thoracolumbar region: Secondary | ICD-10-CM | POA: Diagnosis not present

## 2022-03-03 DIAGNOSIS — M4726 Other spondylosis with radiculopathy, lumbar region: Secondary | ICD-10-CM | POA: Diagnosis not present

## 2022-03-03 DIAGNOSIS — M9903 Segmental and somatic dysfunction of lumbar region: Secondary | ICD-10-CM | POA: Diagnosis not present

## 2022-03-12 DIAGNOSIS — M4725 Other spondylosis with radiculopathy, thoracolumbar region: Secondary | ICD-10-CM | POA: Diagnosis not present

## 2022-03-12 DIAGNOSIS — M4726 Other spondylosis with radiculopathy, lumbar region: Secondary | ICD-10-CM | POA: Diagnosis not present

## 2022-03-12 DIAGNOSIS — M9903 Segmental and somatic dysfunction of lumbar region: Secondary | ICD-10-CM | POA: Diagnosis not present

## 2022-03-12 DIAGNOSIS — M9904 Segmental and somatic dysfunction of sacral region: Secondary | ICD-10-CM | POA: Diagnosis not present

## 2022-03-12 DIAGNOSIS — M5388 Other specified dorsopathies, sacral and sacrococcygeal region: Secondary | ICD-10-CM | POA: Diagnosis not present

## 2022-03-12 DIAGNOSIS — M9902 Segmental and somatic dysfunction of thoracic region: Secondary | ICD-10-CM | POA: Diagnosis not present

## 2022-03-17 ENCOUNTER — Ambulatory Visit: Payer: Medicare PPO | Admitting: Neurology

## 2022-03-17 ENCOUNTER — Encounter: Payer: Self-pay | Admitting: Neurology

## 2022-03-17 VITALS — BP 120/75 | HR 77 | Ht 72.0 in | Wt 234.0 lb

## 2022-03-17 DIAGNOSIS — Z978 Presence of other specified devices: Secondary | ICD-10-CM

## 2022-03-17 DIAGNOSIS — S14105S Unspecified injury at C5 level of cervical spinal cord, sequela: Secondary | ICD-10-CM | POA: Diagnosis not present

## 2022-03-17 DIAGNOSIS — G4733 Obstructive sleep apnea (adult) (pediatric): Secondary | ICD-10-CM

## 2022-03-17 DIAGNOSIS — G2581 Restless legs syndrome: Secondary | ICD-10-CM

## 2022-03-17 DIAGNOSIS — G8250 Quadriplegia, unspecified: Secondary | ICD-10-CM

## 2022-03-17 DIAGNOSIS — Z9989 Dependence on other enabling machines and devices: Secondary | ICD-10-CM | POA: Diagnosis not present

## 2022-03-17 MED ORDER — ROPINIROLE HCL 1 MG PO TABS: ORAL_TABLET | ORAL | 11 refills | Status: DC
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
## 2022-03-17 NOTE — Progress Notes (Signed)
Skin prepped with Betadine and draped in sterile fashion. Center port of implanted pump accessed using Huber needle. Residual fluid measuring 5 ml removed from reservoir.  Medication from [facility] stock instilled into pump. NDC 20233-435-68 Gablofen 40,000 mcg/20 ml. LOT: [id number] and EXP 04/2024  Pump reprogrammed to reflect volume of 82ml.  New alarm date is 09/04/2022. Apt was made for the patient 08/25/2022 at 1:30 pm. Pt tolerated well.

## 2022-03-27 ENCOUNTER — Telehealth: Payer: Self-pay | Admitting: Adult Health

## 2022-03-27 DIAGNOSIS — G4733 Obstructive sleep apnea (adult) (pediatric): Secondary | ICD-10-CM

## 2022-03-27 NOTE — Addendum Note (Signed)
Addended by: Enedina Finner on: 03/27/2022 02:05 PM   Modules accepted: Orders

## 2022-03-27 NOTE — Addendum Note (Signed)
Addended by: Bertram Savin on: 03/27/2022 02:28 PM   Modules accepted: Orders

## 2022-03-27 NOTE — Telephone Encounter (Signed)
Pt states his CPAP has continued to work (other than water tank leaking now) months ago his wife brought to his attention that the message :Motor life exceeded  appeared on his CPAP.  Pt states he contacted the DME weeks ago and was told he would get a new CPAP.  Pt recently informed he needed to call here so order can be submitted for a new one.  Please call.

## 2022-03-27 NOTE — Addendum Note (Signed)
Addended by: Guy Begin on: 03/27/2022 01:19 PM   Modules accepted: Orders

## 2022-03-27 NOTE — Telephone Encounter (Signed)
I spoke with the patient.  He is amenable to doing a home sleep test.  He did have concerns about the timeframe however because the machine only is leaking from the water chamber and motor has exceeded its life expectancy.  The patient states that he cannot go without it.  He did ask if it would be possible to use his old CPAP machine and have the settings changed to current.  I let him know [facility] might be able to do this for him but I would need to get an order for a nurse practitioner.  He was very Adult nurse.  He would plan to use that machine until he can get his new one.

## 2022-03-27 NOTE — Telephone Encounter (Signed)
He will need HST. If amenable we can order

## 2022-03-31 NOTE — Addendum Note (Signed)
Addended by: Enedina Finner on: 03/31/2022 11:59 AM   Modules accepted: Orders

## 2022-03-31 NOTE — Addendum Note (Signed)
Addended by: Guy Begin on: 03/31/2022 04:09 PM   Modules accepted: Orders

## 2022-03-31 NOTE — Telephone Encounter (Signed)
I called pt I relayed that HST will be cancelled at this time, machine ordered.  (Message sent to [facility]) for new machine.  They will authorize and then speak to him what insurance said.  He has appt 05-07-2022 for follow up.  He appreciated call.

## 2022-03-31 NOTE — Telephone Encounter (Signed)
We can hold off till I see him again

## 2022-03-31 NOTE — Telephone Encounter (Signed)
Please let the patient know that I went ahead and placed order for new machine.

## 2022-04-08 NOTE — Telephone Encounter (Signed)
Hepler, Melanie Karma Ganja, RN got it      Previous Messages    ----- Message -----  From: Guy Begin, RN  Sent: 04/01/2022   7:52 AM EDT  To: Mellody Dance; Billee Cashing   New order for pt (new machine) in Epic.  Old machine motor exceeded   [name] "Deniece Portela"  Male, 56 y.o., [birth year]  Pronouns:  he/him/his  MRN:  [id number]   Andrey Campanile RN

## 2022-05-07 ENCOUNTER — Ambulatory Visit: Payer: Medicare PPO | Admitting: Adult Health

## 2022-05-13 ENCOUNTER — Telehealth: Payer: Self-pay | Admitting: *Deleted

## 2022-05-13 NOTE — Telephone Encounter (Signed)
Received fax from [facility] oxygen requesting signature for 60 month oxygen re-certification. Patient gets O2 bled into his CPAP at night. This order says 2 LPM but the chart says patient is on 3 LPM. I called [employer] ([place]) but they were unable to locate any info as to why the LPM has changed. I called the patient and LVM asking for call back.

## 2022-05-14 DIAGNOSIS — M4726 Other spondylosis with radiculopathy, lumbar region: Secondary | ICD-10-CM | POA: Diagnosis not present

## 2022-05-14 DIAGNOSIS — M9903 Segmental and somatic dysfunction of lumbar region: Secondary | ICD-10-CM | POA: Diagnosis not present

## 2022-05-14 DIAGNOSIS — M9902 Segmental and somatic dysfunction of thoracic region: Secondary | ICD-10-CM | POA: Diagnosis not present

## 2022-05-14 DIAGNOSIS — M4725 Other spondylosis with radiculopathy, thoracolumbar region: Secondary | ICD-10-CM | POA: Diagnosis not present

## 2022-05-14 DIAGNOSIS — M5388 Other specified dorsopathies, sacral and sacrococcygeal region: Secondary | ICD-10-CM | POA: Diagnosis not present

## 2022-05-14 DIAGNOSIS — M9904 Segmental and somatic dysfunction of sacral region: Secondary | ICD-10-CM | POA: Diagnosis not present

## 2022-05-14 NOTE — Telephone Encounter (Signed)
Billing dept reports pt called back and LVM.

## 2022-05-15 NOTE — Telephone Encounter (Signed)
I spoke with the patient.  He states his oxygen has been 3 LPM.  He cannot recall who told him to set it there but that is what it has been set at.  I informed him that I had reviewed the chart and noted that he started at 2.5 LPM back in 2017 and then was increased to 3 LPM.  In October 2018 Dr. Lamonte Sakai said he was on a stable dose, patient to continue oxygen with CPAP, and he would defer oxygen and CPAP renewals to neurology.  I have updated this paper order here to 3 LPM and will have Jinny Blossom NP review and sign. They are asking for 60 month renewal.

## 2022-05-15 NOTE — Telephone Encounter (Signed)
Order signed by MM NP and faxed back to [place]. Received a receipt of confirmation.

## 2022-05-21 DIAGNOSIS — M9903 Segmental and somatic dysfunction of lumbar region: Secondary | ICD-10-CM | POA: Diagnosis not present

## 2022-05-21 DIAGNOSIS — M4726 Other spondylosis with radiculopathy, lumbar region: Secondary | ICD-10-CM | POA: Diagnosis not present

## 2022-05-21 DIAGNOSIS — M9904 Segmental and somatic dysfunction of sacral region: Secondary | ICD-10-CM | POA: Diagnosis not present

## 2022-05-21 DIAGNOSIS — M5388 Other specified dorsopathies, sacral and sacrococcygeal region: Secondary | ICD-10-CM | POA: Diagnosis not present

## 2022-05-21 DIAGNOSIS — M4725 Other spondylosis with radiculopathy, thoracolumbar region: Secondary | ICD-10-CM | POA: Diagnosis not present

## 2022-05-21 DIAGNOSIS — M9902 Segmental and somatic dysfunction of thoracic region: Secondary | ICD-10-CM | POA: Diagnosis not present

## 2022-05-26 DIAGNOSIS — M5388 Other specified dorsopathies, sacral and sacrococcygeal region: Secondary | ICD-10-CM | POA: Diagnosis not present

## 2022-05-26 DIAGNOSIS — M4725 Other spondylosis with radiculopathy, thoracolumbar region: Secondary | ICD-10-CM | POA: Diagnosis not present

## 2022-05-26 DIAGNOSIS — M4726 Other spondylosis with radiculopathy, lumbar region: Secondary | ICD-10-CM | POA: Diagnosis not present

## 2022-05-26 DIAGNOSIS — M9903 Segmental and somatic dysfunction of lumbar region: Secondary | ICD-10-CM | POA: Diagnosis not present

## 2022-05-26 DIAGNOSIS — M9904 Segmental and somatic dysfunction of sacral region: Secondary | ICD-10-CM | POA: Diagnosis not present

## 2022-05-26 DIAGNOSIS — M9902 Segmental and somatic dysfunction of thoracic region: Secondary | ICD-10-CM | POA: Diagnosis not present

## 2022-05-28 DIAGNOSIS — M5459 Other low back pain: Secondary | ICD-10-CM | POA: Diagnosis not present

## 2022-05-28 DIAGNOSIS — M9901 Segmental and somatic dysfunction of cervical region: Secondary | ICD-10-CM | POA: Diagnosis not present

## 2022-05-28 DIAGNOSIS — M5417 Radiculopathy, lumbosacral region: Secondary | ICD-10-CM | POA: Diagnosis not present

## 2022-05-28 DIAGNOSIS — M9903 Segmental and somatic dysfunction of lumbar region: Secondary | ICD-10-CM | POA: Diagnosis not present

## 2022-05-28 DIAGNOSIS — M542 Cervicalgia: Secondary | ICD-10-CM | POA: Diagnosis not present

## 2022-06-02 DIAGNOSIS — M9902 Segmental and somatic dysfunction of thoracic region: Secondary | ICD-10-CM | POA: Diagnosis not present

## 2022-06-02 DIAGNOSIS — M4726 Other spondylosis with radiculopathy, lumbar region: Secondary | ICD-10-CM | POA: Diagnosis not present

## 2022-06-02 DIAGNOSIS — M5388 Other specified dorsopathies, sacral and sacrococcygeal region: Secondary | ICD-10-CM | POA: Diagnosis not present

## 2022-06-02 DIAGNOSIS — M4725 Other spondylosis with radiculopathy, thoracolumbar region: Secondary | ICD-10-CM | POA: Diagnosis not present

## 2022-06-02 DIAGNOSIS — M9903 Segmental and somatic dysfunction of lumbar region: Secondary | ICD-10-CM | POA: Diagnosis not present

## 2022-06-02 DIAGNOSIS — M9904 Segmental and somatic dysfunction of sacral region: Secondary | ICD-10-CM | POA: Diagnosis not present
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
## 2022-06-09 ENCOUNTER — Encounter: Payer: Self-pay | Admitting: Adult Health

## 2022-06-09 ENCOUNTER — Ambulatory Visit: Payer: Medicare PPO | Admitting: Adult Health

## 2022-06-09 VITALS — BP 137/77 | HR 100 | Ht 72.0 in | Wt 242.8 lb

## 2022-06-09 DIAGNOSIS — G4733 Obstructive sleep apnea (adult) (pediatric): Secondary | ICD-10-CM

## 2022-06-09 NOTE — Patient Instructions (Signed)
Continue using CPAP nightly and greater than 4 hours each night °If your symptoms worsen or you develop new symptoms please let us know.  ° °

## 2022-06-25 DIAGNOSIS — M5388 Other specified dorsopathies, sacral and sacrococcygeal region: Secondary | ICD-10-CM | POA: Diagnosis not present

## 2022-06-25 DIAGNOSIS — M9902 Segmental and somatic dysfunction of thoracic region: Secondary | ICD-10-CM | POA: Diagnosis not present

## 2022-06-25 DIAGNOSIS — M9903 Segmental and somatic dysfunction of lumbar region: Secondary | ICD-10-CM | POA: Diagnosis not present

## 2022-06-25 DIAGNOSIS — M9904 Segmental and somatic dysfunction of sacral region: Secondary | ICD-10-CM | POA: Diagnosis not present

## 2022-06-25 DIAGNOSIS — M4726 Other spondylosis with radiculopathy, lumbar region: Secondary | ICD-10-CM | POA: Diagnosis not present

## 2022-06-25 DIAGNOSIS — M4725 Other spondylosis with radiculopathy, thoracolumbar region: Secondary | ICD-10-CM | POA: Diagnosis not present

## 2022-07-04 DIAGNOSIS — M5388 Other specified dorsopathies, sacral and sacrococcygeal region: Secondary | ICD-10-CM | POA: Diagnosis not present

## 2022-07-04 DIAGNOSIS — M9902 Segmental and somatic dysfunction of thoracic region: Secondary | ICD-10-CM | POA: Diagnosis not present

## 2022-07-04 DIAGNOSIS — M4726 Other spondylosis with radiculopathy, lumbar region: Secondary | ICD-10-CM | POA: Diagnosis not present

## 2022-07-04 DIAGNOSIS — M4725 Other spondylosis with radiculopathy, thoracolumbar region: Secondary | ICD-10-CM | POA: Diagnosis not present

## 2022-07-04 DIAGNOSIS — M9904 Segmental and somatic dysfunction of sacral region: Secondary | ICD-10-CM | POA: Diagnosis not present

## 2022-07-04 DIAGNOSIS — M9903 Segmental and somatic dysfunction of lumbar region: Secondary | ICD-10-CM | POA: Diagnosis not present

## 2022-07-10 DIAGNOSIS — M9904 Segmental and somatic dysfunction of sacral region: Secondary | ICD-10-CM | POA: Diagnosis not present

## 2022-07-10 DIAGNOSIS — M4725 Other spondylosis with radiculopathy, thoracolumbar region: Secondary | ICD-10-CM | POA: Diagnosis not present

## 2022-07-10 DIAGNOSIS — M9902 Segmental and somatic dysfunction of thoracic region: Secondary | ICD-10-CM | POA: Diagnosis not present

## 2022-07-10 DIAGNOSIS — M9903 Segmental and somatic dysfunction of lumbar region: Secondary | ICD-10-CM | POA: Diagnosis not present

## 2022-07-10 DIAGNOSIS — M5388 Other specified dorsopathies, sacral and sacrococcygeal region: Secondary | ICD-10-CM | POA: Diagnosis not present

## 2022-07-10 DIAGNOSIS — M4726 Other spondylosis with radiculopathy, lumbar region: Secondary | ICD-10-CM | POA: Diagnosis not present

## 2022-08-05 DIAGNOSIS — G4733 Obstructive sleep apnea (adult) (pediatric): Secondary | ICD-10-CM | POA: Diagnosis not present

## 2022-08-05 DIAGNOSIS — Z8673 Personal history of transient ischemic attack (TIA), and cerebral infarction without residual deficits: Secondary | ICD-10-CM | POA: Diagnosis not present

## 2022-08-05 DIAGNOSIS — I10 Essential (primary) hypertension: Secondary | ICD-10-CM | POA: Diagnosis not present

## 2022-08-05 DIAGNOSIS — G4700 Insomnia, unspecified: Secondary | ICD-10-CM | POA: Diagnosis not present

## 2022-08-20 DIAGNOSIS — M4726 Other spondylosis with radiculopathy, lumbar region: Secondary | ICD-10-CM | POA: Diagnosis not present

## 2022-08-20 DIAGNOSIS — M5388 Other specified dorsopathies, sacral and sacrococcygeal region: Secondary | ICD-10-CM | POA: Diagnosis not present

## 2022-08-20 DIAGNOSIS — M9902 Segmental and somatic dysfunction of thoracic region: Secondary | ICD-10-CM | POA: Diagnosis not present

## 2022-08-20 DIAGNOSIS — M9904 Segmental and somatic dysfunction of sacral region: Secondary | ICD-10-CM | POA: Diagnosis not present

## 2022-08-20 DIAGNOSIS — M9903 Segmental and somatic dysfunction of lumbar region: Secondary | ICD-10-CM | POA: Diagnosis not present

## 2022-08-20 DIAGNOSIS — M4725 Other spondylosis with radiculopathy, thoracolumbar region: Secondary | ICD-10-CM | POA: Diagnosis not present

## 2022-08-25 ENCOUNTER — Encounter: Payer: Self-pay | Admitting: Neurology

## 2022-08-25 ENCOUNTER — Ambulatory Visit: Payer: Medicare PPO | Admitting: Neurology

## 2022-08-25 VITALS — BP 118/90 | HR 78 | Ht 72.0 in | Wt 245.0 lb

## 2022-08-25 DIAGNOSIS — Z978 Presence of other specified devices: Secondary | ICD-10-CM | POA: Diagnosis not present

## 2022-08-25 DIAGNOSIS — G8250 Quadriplegia, unspecified: Secondary | ICD-10-CM | POA: Diagnosis not present

## 2022-08-25 DIAGNOSIS — G2581 Restless legs syndrome: Secondary | ICD-10-CM | POA: Diagnosis not present

## 2022-08-25 DIAGNOSIS — S14105S Unspecified injury at C5 level of cervical spinal cord, sequela: Secondary | ICD-10-CM | POA: Diagnosis not present

## 2022-08-25 MED ORDER — GABAPENTIN 800 MG PO TABS: 800.0000 mg | ORAL_TABLET | Freq: Three times a day (TID) | ORAL | 3 refills | Status: DC

## 2022-08-25 MED ORDER — NORTRIPTYLINE HCL 75 MG PO CAPS: 150.0000 mg | ORAL_CAPSULE | Freq: Every day | ORAL | 3 refills | Status: DC
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
## 2022-08-25 NOTE — Progress Notes (Signed)
Skin prepped with Betadine and draped in sterile fashion. Center port of implanted pump accessed using Huber needle. Residual fluid measuring 6 ml removed from reservoir.  Medication from [place] instilled into pump. [place] [id number] Gablofen 40,000 mcg/20 ml. LOT: [id number] and EXP 02/2025. Estimated replacement date: 02/2025.   Pump reprogrammed to reflect volume of 92ml.  New alarm date is 02/12/2023. Apt was made for the patient 01/26/2023 at 1:30pm with Dr. Felecia Shelling. Pt tolerated well.

## 2022-08-27 DIAGNOSIS — M4800 Spinal stenosis, site unspecified: Secondary | ICD-10-CM | POA: Diagnosis not present

## 2022-08-27 DIAGNOSIS — I2782 Chronic pulmonary embolism: Secondary | ICD-10-CM | POA: Diagnosis not present

## 2022-08-27 DIAGNOSIS — G4733 Obstructive sleep apnea (adult) (pediatric): Secondary | ICD-10-CM | POA: Diagnosis not present

## 2022-08-27 DIAGNOSIS — M5388 Other specified dorsopathies, sacral and sacrococcygeal region: Secondary | ICD-10-CM | POA: Diagnosis not present

## 2022-08-27 DIAGNOSIS — M9904 Segmental and somatic dysfunction of sacral region: Secondary | ICD-10-CM | POA: Diagnosis not present

## 2022-08-27 DIAGNOSIS — M4726 Other spondylosis with radiculopathy, lumbar region: Secondary | ICD-10-CM | POA: Diagnosis not present

## 2022-08-27 DIAGNOSIS — M9902 Segmental and somatic dysfunction of thoracic region: Secondary | ICD-10-CM | POA: Diagnosis not present

## 2022-08-27 DIAGNOSIS — M4725 Other spondylosis with radiculopathy, thoracolumbar region: Secondary | ICD-10-CM | POA: Diagnosis not present

## 2022-08-27 DIAGNOSIS — M9903 Segmental and somatic dysfunction of lumbar region: Secondary | ICD-10-CM | POA: Diagnosis not present

## 2022-08-27 DIAGNOSIS — R209 Unspecified disturbances of skin sensation: Secondary | ICD-10-CM | POA: Diagnosis not present

## 2022-09-03 DIAGNOSIS — M5388 Other specified dorsopathies, sacral and sacrococcygeal region: Secondary | ICD-10-CM | POA: Diagnosis not present

## 2022-09-03 DIAGNOSIS — M9904 Segmental and somatic dysfunction of sacral region: Secondary | ICD-10-CM | POA: Diagnosis not present

## 2022-09-03 DIAGNOSIS — M4725 Other spondylosis with radiculopathy, thoracolumbar region: Secondary | ICD-10-CM | POA: Diagnosis not present

## 2022-09-03 DIAGNOSIS — M9903 Segmental and somatic dysfunction of lumbar region: Secondary | ICD-10-CM | POA: Diagnosis not present

## 2022-09-03 DIAGNOSIS — M4726 Other spondylosis with radiculopathy, lumbar region: Secondary | ICD-10-CM | POA: Diagnosis not present

## 2022-09-03 DIAGNOSIS — M9902 Segmental and somatic dysfunction of thoracic region: Secondary | ICD-10-CM | POA: Diagnosis not present

## 2022-09-05 DIAGNOSIS — G4700 Insomnia, unspecified: Secondary | ICD-10-CM | POA: Diagnosis not present

## 2022-09-05 DIAGNOSIS — Z8673 Personal history of transient ischemic attack (TIA), and cerebral infarction without residual deficits: Secondary | ICD-10-CM | POA: Diagnosis not present

## 2022-09-05 DIAGNOSIS — I10 Essential (primary) hypertension: Secondary | ICD-10-CM | POA: Diagnosis not present

## 2022-09-05 DIAGNOSIS — G4733 Obstructive sleep apnea (adult) (pediatric): Secondary | ICD-10-CM | POA: Diagnosis not present

## 2022-09-15 DIAGNOSIS — M4726 Other spondylosis with radiculopathy, lumbar region: Secondary | ICD-10-CM | POA: Diagnosis not present

## 2022-09-15 DIAGNOSIS — M9902 Segmental and somatic dysfunction of thoracic region: Secondary | ICD-10-CM | POA: Diagnosis not present

## 2022-09-15 DIAGNOSIS — M9904 Segmental and somatic dysfunction of sacral region: Secondary | ICD-10-CM | POA: Diagnosis not present

## 2022-09-15 DIAGNOSIS — M4725 Other spondylosis with radiculopathy, thoracolumbar region: Secondary | ICD-10-CM | POA: Diagnosis not present

## 2022-09-15 DIAGNOSIS — M5388 Other specified dorsopathies, sacral and sacrococcygeal region: Secondary | ICD-10-CM | POA: Diagnosis not present

## 2022-09-15 DIAGNOSIS — M9903 Segmental and somatic dysfunction of lumbar region: Secondary | ICD-10-CM | POA: Diagnosis not present

## 2022-09-27 DIAGNOSIS — M4800 Spinal stenosis, site unspecified: Secondary | ICD-10-CM | POA: Diagnosis not present

## 2022-09-27 DIAGNOSIS — R209 Unspecified disturbances of skin sensation: Secondary | ICD-10-CM | POA: Diagnosis not present

## 2022-09-27 DIAGNOSIS — I2782 Chronic pulmonary embolism: Secondary | ICD-10-CM | POA: Diagnosis not present

## 2022-09-27 DIAGNOSIS — G4733 Obstructive sleep apnea (adult) (pediatric): Secondary | ICD-10-CM | POA: Diagnosis not present

## 2022-09-30 DIAGNOSIS — L578 Other skin changes due to chronic exposure to nonionizing radiation: Secondary | ICD-10-CM | POA: Diagnosis not present

## 2022-09-30 DIAGNOSIS — L814 Other melanin hyperpigmentation: Secondary | ICD-10-CM | POA: Diagnosis not present

## 2022-09-30 DIAGNOSIS — L820 Inflamed seborrheic keratosis: Secondary | ICD-10-CM | POA: Diagnosis not present

## 2022-09-30 DIAGNOSIS — D225 Melanocytic nevi of trunk: Secondary | ICD-10-CM | POA: Diagnosis not present

## 2022-10-04 DIAGNOSIS — Z8673 Personal history of transient ischemic attack (TIA), and cerebral infarction without residual deficits: Secondary | ICD-10-CM | POA: Diagnosis not present

## 2022-10-04 DIAGNOSIS — I10 Essential (primary) hypertension: Secondary | ICD-10-CM | POA: Diagnosis not present

## 2022-10-04 DIAGNOSIS — G4733 Obstructive sleep apnea (adult) (pediatric): Secondary | ICD-10-CM | POA: Diagnosis not present

## 2022-10-04 DIAGNOSIS — G4700 Insomnia, unspecified: Secondary | ICD-10-CM | POA: Diagnosis not present

## 2022-10-08 DIAGNOSIS — M9903 Segmental and somatic dysfunction of lumbar region: Secondary | ICD-10-CM | POA: Diagnosis not present

## 2022-10-08 DIAGNOSIS — M4726 Other spondylosis with radiculopathy, lumbar region: Secondary | ICD-10-CM | POA: Diagnosis not present

## 2022-10-08 DIAGNOSIS — M9902 Segmental and somatic dysfunction of thoracic region: Secondary | ICD-10-CM | POA: Diagnosis not present

## 2022-10-08 DIAGNOSIS — M5388 Other specified dorsopathies, sacral and sacrococcygeal region: Secondary | ICD-10-CM | POA: Diagnosis not present

## 2022-10-08 DIAGNOSIS — M4725 Other spondylosis with radiculopathy, thoracolumbar region: Secondary | ICD-10-CM | POA: Diagnosis not present

## 2022-10-08 DIAGNOSIS — M9904 Segmental and somatic dysfunction of sacral region: Secondary | ICD-10-CM | POA: Diagnosis not present

## 2022-10-26 DIAGNOSIS — I2782 Chronic pulmonary embolism: Secondary | ICD-10-CM | POA: Diagnosis not present

## 2022-10-26 DIAGNOSIS — M4800 Spinal stenosis, site unspecified: Secondary | ICD-10-CM | POA: Diagnosis not present

## 2022-10-26 DIAGNOSIS — R209 Unspecified disturbances of skin sensation: Secondary | ICD-10-CM | POA: Diagnosis not present

## 2022-10-26 DIAGNOSIS — G4733 Obstructive sleep apnea (adult) (pediatric): Secondary | ICD-10-CM | POA: Diagnosis not present

## 2022-11-04 DIAGNOSIS — G4733 Obstructive sleep apnea (adult) (pediatric): Secondary | ICD-10-CM | POA: Diagnosis not present

## 2022-11-04 DIAGNOSIS — Z8673 Personal history of transient ischemic attack (TIA), and cerebral infarction without residual deficits: Secondary | ICD-10-CM | POA: Diagnosis not present

## 2022-11-04 DIAGNOSIS — G4700 Insomnia, unspecified: Secondary | ICD-10-CM | POA: Diagnosis not present

## 2022-11-04 DIAGNOSIS — I10 Essential (primary) hypertension: Secondary | ICD-10-CM | POA: Diagnosis not present

## 2022-11-19 DIAGNOSIS — M9903 Segmental and somatic dysfunction of lumbar region: Secondary | ICD-10-CM | POA: Diagnosis not present

## 2022-11-19 DIAGNOSIS — M4726 Other spondylosis with radiculopathy, lumbar region: Secondary | ICD-10-CM | POA: Diagnosis not present

## 2022-11-19 DIAGNOSIS — M5388 Other specified dorsopathies, sacral and sacrococcygeal region: Secondary | ICD-10-CM | POA: Diagnosis not present

## 2022-11-19 DIAGNOSIS — M4725 Other spondylosis with radiculopathy, thoracolumbar region: Secondary | ICD-10-CM | POA: Diagnosis not present

## 2022-11-19 DIAGNOSIS — M9902 Segmental and somatic dysfunction of thoracic region: Secondary | ICD-10-CM | POA: Diagnosis not present

## 2022-11-19 DIAGNOSIS — M9904 Segmental and somatic dysfunction of sacral region: Secondary | ICD-10-CM | POA: Diagnosis not present

## 2022-11-26 DIAGNOSIS — I2782 Chronic pulmonary embolism: Secondary | ICD-10-CM | POA: Diagnosis not present

## 2022-11-26 DIAGNOSIS — M4800 Spinal stenosis, site unspecified: Secondary | ICD-10-CM | POA: Diagnosis not present

## 2022-11-26 DIAGNOSIS — G4733 Obstructive sleep apnea (adult) (pediatric): Secondary | ICD-10-CM | POA: Diagnosis not present

## 2022-11-26 DIAGNOSIS — R209 Unspecified disturbances of skin sensation: Secondary | ICD-10-CM | POA: Diagnosis not present

## 2022-12-02 DIAGNOSIS — Z1339 Encounter for screening examination for other mental health and behavioral disorders: Secondary | ICD-10-CM | POA: Diagnosis not present

## 2022-12-02 DIAGNOSIS — Z6832 Body mass index (BMI) 32.0-32.9, adult: Secondary | ICD-10-CM | POA: Diagnosis not present

## 2022-12-02 DIAGNOSIS — Z1331 Encounter for screening for depression: Secondary | ICD-10-CM | POA: Diagnosis not present

## 2022-12-02 DIAGNOSIS — E785 Hyperlipidemia, unspecified: Secondary | ICD-10-CM | POA: Diagnosis not present

## 2022-12-02 DIAGNOSIS — I10 Essential (primary) hypertension: Secondary | ICD-10-CM | POA: Diagnosis not present

## 2022-12-02 DIAGNOSIS — Z0000 Encounter for general adult medical examination without abnormal findings: Secondary | ICD-10-CM | POA: Diagnosis not present

## 2022-12-02 DIAGNOSIS — Z125 Encounter for screening for malignant neoplasm of prostate: Secondary | ICD-10-CM | POA: Diagnosis not present

## 2022-12-02 DIAGNOSIS — M109 Gout, unspecified: Secondary | ICD-10-CM | POA: Diagnosis not present

## 2022-12-02 DIAGNOSIS — Z79899 Other long term (current) drug therapy: Secondary | ICD-10-CM | POA: Diagnosis not present

## 2022-12-04 DIAGNOSIS — G4700 Insomnia, unspecified: Secondary | ICD-10-CM | POA: Diagnosis not present

## 2022-12-04 DIAGNOSIS — I10 Essential (primary) hypertension: Secondary | ICD-10-CM | POA: Diagnosis not present

## 2022-12-04 DIAGNOSIS — G4733 Obstructive sleep apnea (adult) (pediatric): Secondary | ICD-10-CM | POA: Diagnosis not present

## 2022-12-04 DIAGNOSIS — Z8673 Personal history of transient ischemic attack (TIA), and cerebral infarction without residual deficits: Secondary | ICD-10-CM | POA: Diagnosis not present

## 2022-12-10 DIAGNOSIS — M9904 Segmental and somatic dysfunction of sacral region: Secondary | ICD-10-CM | POA: Diagnosis not present

## 2022-12-10 DIAGNOSIS — M4725 Other spondylosis with radiculopathy, thoracolumbar region: Secondary | ICD-10-CM | POA: Diagnosis not present

## 2022-12-10 DIAGNOSIS — M5388 Other specified dorsopathies, sacral and sacrococcygeal region: Secondary | ICD-10-CM | POA: Diagnosis not present

## 2022-12-10 DIAGNOSIS — M9903 Segmental and somatic dysfunction of lumbar region: Secondary | ICD-10-CM | POA: Diagnosis not present

## 2022-12-10 DIAGNOSIS — M4726 Other spondylosis with radiculopathy, lumbar region: Secondary | ICD-10-CM | POA: Diagnosis not present

## 2022-12-10 DIAGNOSIS — M9902 Segmental and somatic dysfunction of thoracic region: Secondary | ICD-10-CM | POA: Diagnosis not present

## 2022-12-21 DIAGNOSIS — Z1211 Encounter for screening for malignant neoplasm of colon: Secondary | ICD-10-CM | POA: Diagnosis not present

## 2022-12-21 DIAGNOSIS — Z1212 Encounter for screening for malignant neoplasm of rectum: Secondary | ICD-10-CM | POA: Diagnosis not present

## 2022-12-26 DIAGNOSIS — G4733 Obstructive sleep apnea (adult) (pediatric): Secondary | ICD-10-CM | POA: Diagnosis not present

## 2022-12-26 DIAGNOSIS — M4800 Spinal stenosis, site unspecified: Secondary | ICD-10-CM | POA: Diagnosis not present

## 2022-12-26 DIAGNOSIS — I2782 Chronic pulmonary embolism: Secondary | ICD-10-CM | POA: Diagnosis not present

## 2022-12-26 DIAGNOSIS — R209 Unspecified disturbances of skin sensation: Secondary | ICD-10-CM | POA: Diagnosis not present

## 2022-12-28 LAB — COLOGUARD: COLOGUARD: NEGATIVE

## 2023-01-02 DIAGNOSIS — M9904 Segmental and somatic dysfunction of sacral region: Secondary | ICD-10-CM | POA: Diagnosis not present

## 2023-01-02 DIAGNOSIS — M4726 Other spondylosis with radiculopathy, lumbar region: Secondary | ICD-10-CM | POA: Diagnosis not present

## 2023-01-02 DIAGNOSIS — M9902 Segmental and somatic dysfunction of thoracic region: Secondary | ICD-10-CM | POA: Diagnosis not present

## 2023-01-02 DIAGNOSIS — M5388 Other specified dorsopathies, sacral and sacrococcygeal region: Secondary | ICD-10-CM | POA: Diagnosis not present

## 2023-01-02 DIAGNOSIS — M4725 Other spondylosis with radiculopathy, thoracolumbar region: Secondary | ICD-10-CM | POA: Diagnosis not present

## 2023-01-02 DIAGNOSIS — M9903 Segmental and somatic dysfunction of lumbar region: Secondary | ICD-10-CM | POA: Diagnosis not present

## 2023-01-04 DIAGNOSIS — I10 Essential (primary) hypertension: Secondary | ICD-10-CM | POA: Diagnosis not present

## 2023-01-04 DIAGNOSIS — G4733 Obstructive sleep apnea (adult) (pediatric): Secondary | ICD-10-CM | POA: Diagnosis not present

## 2023-01-04 DIAGNOSIS — Z8673 Personal history of transient ischemic attack (TIA), and cerebral infarction without residual deficits: Secondary | ICD-10-CM | POA: Diagnosis not present

## 2023-01-04 DIAGNOSIS — G4700 Insomnia, unspecified: Secondary | ICD-10-CM | POA: Diagnosis not present

## 2023-01-14 DIAGNOSIS — M4726 Other spondylosis with radiculopathy, lumbar region: Secondary | ICD-10-CM | POA: Diagnosis not present

## 2023-01-14 DIAGNOSIS — M9903 Segmental and somatic dysfunction of lumbar region: Secondary | ICD-10-CM | POA: Diagnosis not present

## 2023-01-14 DIAGNOSIS — M9902 Segmental and somatic dysfunction of thoracic region: Secondary | ICD-10-CM | POA: Diagnosis not present

## 2023-01-14 DIAGNOSIS — M4725 Other spondylosis with radiculopathy, thoracolumbar region: Secondary | ICD-10-CM | POA: Diagnosis not present

## 2023-01-14 DIAGNOSIS — M9904 Segmental and somatic dysfunction of sacral region: Secondary | ICD-10-CM | POA: Diagnosis not present

## 2023-01-14 DIAGNOSIS — M4727 Other spondylosis with radiculopathy, lumbosacral region: Secondary | ICD-10-CM | POA: Diagnosis not present

## 2023-01-26 ENCOUNTER — Ambulatory Visit: Payer: Medicare PPO | Admitting: Neurology

## 2023-01-26 VITALS — Ht 72.0 in | Wt 234.0 lb

## 2023-01-26 DIAGNOSIS — Z978 Presence of other specified devices: Secondary | ICD-10-CM | POA: Diagnosis not present

## 2023-01-26 DIAGNOSIS — S14105S Unspecified injury at C5 level of cervical spinal cord, sequela: Secondary | ICD-10-CM

## 2023-01-26 DIAGNOSIS — G2581 Restless legs syndrome: Secondary | ICD-10-CM | POA: Diagnosis not present

## 2023-01-26 DIAGNOSIS — G4733 Obstructive sleep apnea (adult) (pediatric): Secondary | ICD-10-CM | POA: Diagnosis not present

## 2023-01-26 DIAGNOSIS — G8250 Quadriplegia, unspecified: Secondary | ICD-10-CM

## 2023-01-26 DIAGNOSIS — R209 Unspecified disturbances of skin sensation: Secondary | ICD-10-CM | POA: Diagnosis not present

## 2023-01-26 DIAGNOSIS — G800 Spastic quadriplegic cerebral palsy: Secondary | ICD-10-CM

## 2023-01-26 DIAGNOSIS — M4800 Spinal stenosis, site unspecified: Secondary | ICD-10-CM | POA: Diagnosis not present

## 2023-01-26 DIAGNOSIS — I2782 Chronic pulmonary embolism: Secondary | ICD-10-CM | POA: Diagnosis not present
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
## 2023-01-26 NOTE — Progress Notes (Signed)
Skin prepped with Betadine and draped in sterile fashion. Center port of implanted pump accessed using Huber needle. Residual fluid measuring 4 ml removed from reservoir.  Medication from [facility] stock instilled into pump. NDC 95188-416-60 Gablofen 40,000 mcg/20 ml. LOT: [id number] and EXP 04/2025. Estimated replacement date: 02/2025.  Pump reprogrammed to reflect volume of 20ml.  New alarm date is 07/16/23 Apt was made for the patient 06/29/2023 at 1:30pm with Dr. Epimenio Foot. Pt tolerated well.

## 2023-02-03 DIAGNOSIS — G4733 Obstructive sleep apnea (adult) (pediatric): Secondary | ICD-10-CM | POA: Diagnosis not present

## 2023-02-03 DIAGNOSIS — I10 Essential (primary) hypertension: Secondary | ICD-10-CM | POA: Diagnosis not present

## 2023-02-03 DIAGNOSIS — Z8673 Personal history of transient ischemic attack (TIA), and cerebral infarction without residual deficits: Secondary | ICD-10-CM | POA: Diagnosis not present

## 2023-02-03 DIAGNOSIS — G4700 Insomnia, unspecified: Secondary | ICD-10-CM | POA: Diagnosis not present

## 2023-02-25 DIAGNOSIS — R209 Unspecified disturbances of skin sensation: Secondary | ICD-10-CM | POA: Diagnosis not present

## 2023-02-25 DIAGNOSIS — M4800 Spinal stenosis, site unspecified: Secondary | ICD-10-CM | POA: Diagnosis not present

## 2023-02-25 DIAGNOSIS — I2782 Chronic pulmonary embolism: Secondary | ICD-10-CM | POA: Diagnosis not present

## 2023-02-25 DIAGNOSIS — G4733 Obstructive sleep apnea (adult) (pediatric): Secondary | ICD-10-CM | POA: Diagnosis not present

## 2023-03-04 DIAGNOSIS — G4733 Obstructive sleep apnea (adult) (pediatric): Secondary | ICD-10-CM | POA: Diagnosis not present

## 2023-03-06 DIAGNOSIS — G4733 Obstructive sleep apnea (adult) (pediatric): Secondary | ICD-10-CM | POA: Diagnosis not present

## 2023-03-06 DIAGNOSIS — Z8673 Personal history of transient ischemic attack (TIA), and cerebral infarction without residual deficits: Secondary | ICD-10-CM | POA: Diagnosis not present

## 2023-03-06 DIAGNOSIS — I10 Essential (primary) hypertension: Secondary | ICD-10-CM | POA: Diagnosis not present

## 2023-03-06 DIAGNOSIS — G4700 Insomnia, unspecified: Secondary | ICD-10-CM | POA: Diagnosis not present

## 2023-03-28 DIAGNOSIS — G4733 Obstructive sleep apnea (adult) (pediatric): Secondary | ICD-10-CM | POA: Diagnosis not present

## 2023-03-28 DIAGNOSIS — R209 Unspecified disturbances of skin sensation: Secondary | ICD-10-CM | POA: Diagnosis not present

## 2023-03-28 DIAGNOSIS — M4800 Spinal stenosis, site unspecified: Secondary | ICD-10-CM | POA: Diagnosis not present

## 2023-03-28 DIAGNOSIS — I2782 Chronic pulmonary embolism: Secondary | ICD-10-CM | POA: Diagnosis not present

## 2023-04-06 DIAGNOSIS — G4733 Obstructive sleep apnea (adult) (pediatric): Secondary | ICD-10-CM | POA: Diagnosis not present

## 2023-04-06 DIAGNOSIS — Z8673 Personal history of transient ischemic attack (TIA), and cerebral infarction without residual deficits: Secondary | ICD-10-CM | POA: Diagnosis not present

## 2023-04-06 DIAGNOSIS — G4700 Insomnia, unspecified: Secondary | ICD-10-CM | POA: Diagnosis not present

## 2023-04-06 DIAGNOSIS — I10 Essential (primary) hypertension: Secondary | ICD-10-CM | POA: Diagnosis not present

## 2023-04-28 DIAGNOSIS — I2782 Chronic pulmonary embolism: Secondary | ICD-10-CM | POA: Diagnosis not present

## 2023-04-28 DIAGNOSIS — M4800 Spinal stenosis, site unspecified: Secondary | ICD-10-CM | POA: Diagnosis not present

## 2023-04-28 DIAGNOSIS — R209 Unspecified disturbances of skin sensation: Secondary | ICD-10-CM | POA: Diagnosis not present

## 2023-04-28 DIAGNOSIS — G4733 Obstructive sleep apnea (adult) (pediatric): Secondary | ICD-10-CM | POA: Diagnosis not present

## 2023-05-06 DIAGNOSIS — G4700 Insomnia, unspecified: Secondary | ICD-10-CM | POA: Diagnosis not present

## 2023-05-06 DIAGNOSIS — I10 Essential (primary) hypertension: Secondary | ICD-10-CM | POA: Diagnosis not present

## 2023-05-06 DIAGNOSIS — Z8673 Personal history of transient ischemic attack (TIA), and cerebral infarction without residual deficits: Secondary | ICD-10-CM | POA: Diagnosis not present

## 2023-05-06 DIAGNOSIS — G4733 Obstructive sleep apnea (adult) (pediatric): Secondary | ICD-10-CM | POA: Diagnosis not present

## 2023-05-28 DIAGNOSIS — M4800 Spinal stenosis, site unspecified: Secondary | ICD-10-CM | POA: Diagnosis not present

## 2023-05-28 DIAGNOSIS — I2782 Chronic pulmonary embolism: Secondary | ICD-10-CM | POA: Diagnosis not present

## 2023-05-28 DIAGNOSIS — G4733 Obstructive sleep apnea (adult) (pediatric): Secondary | ICD-10-CM | POA: Diagnosis not present

## 2023-05-28 DIAGNOSIS — R209 Unspecified disturbances of skin sensation: Secondary | ICD-10-CM | POA: Diagnosis not present

## 2023-06-04 DIAGNOSIS — G4733 Obstructive sleep apnea (adult) (pediatric): Secondary | ICD-10-CM | POA: Diagnosis not present

## 2023-06-10 ENCOUNTER — Ambulatory Visit: Payer: Medicare PPO | Admitting: Adult Health

## 2023-06-10 ENCOUNTER — Encounter: Payer: Self-pay | Admitting: Adult Health

## 2023-06-10 VITALS — BP 126/76 | HR 91 | Ht 72.0 in | Wt 240.0 lb

## 2023-06-10 DIAGNOSIS — G4733 Obstructive sleep apnea (adult) (pediatric): Secondary | ICD-10-CM | POA: Diagnosis not present

## 2023-06-10 NOTE — Patient Instructions (Signed)
Continue using CPAP nightly and greater than 4 hours each night °If your symptoms worsen or you develop new symptoms please let us know.  ° °

## 2023-06-28 DIAGNOSIS — M4800 Spinal stenosis, site unspecified: Secondary | ICD-10-CM | POA: Diagnosis not present

## 2023-06-28 DIAGNOSIS — G4733 Obstructive sleep apnea (adult) (pediatric): Secondary | ICD-10-CM | POA: Diagnosis not present

## 2023-06-28 DIAGNOSIS — I2782 Chronic pulmonary embolism: Secondary | ICD-10-CM | POA: Diagnosis not present

## 2023-06-28 DIAGNOSIS — R209 Unspecified disturbances of skin sensation: Secondary | ICD-10-CM | POA: Diagnosis not present

## 2023-06-29 ENCOUNTER — Encounter: Payer: Self-pay | Admitting: Neurology

## 2023-06-29 ENCOUNTER — Ambulatory Visit: Payer: Medicare PPO | Admitting: Neurology

## 2023-06-29 VITALS — BP 143/82 | HR 92 | Ht 72.0 in | Wt 246.5 lb

## 2023-06-29 DIAGNOSIS — Z978 Presence of other specified devices: Secondary | ICD-10-CM

## 2023-06-29 DIAGNOSIS — G2581 Restless legs syndrome: Secondary | ICD-10-CM

## 2023-06-29 DIAGNOSIS — S14105S Unspecified injury at C5 level of cervical spinal cord, sequela: Secondary | ICD-10-CM | POA: Diagnosis not present

## 2023-06-29 DIAGNOSIS — G800 Spastic quadriplegic cerebral palsy: Secondary | ICD-10-CM

## 2023-06-29 DIAGNOSIS — G8250 Quadriplegia, unspecified: Secondary | ICD-10-CM

## 2023-06-29 NOTE — Progress Notes (Signed)
Rm 1, alone.  Skin prepped with Betadine and draped in sterile fashion. Center port of implanted pump accessed using Huber needle. Residual fluid measuring 5 ml removed from reservoir.  Medication from [facility] stock instilled into pump. NDC 95284-132-44 Gablofen 40,000 mcg/20 ml. LOT: [id number] and EXP 03/2024. Estimated replacement date: 02/2025.  Pump reprogrammed to reflect volume of 20ml.  New alarm date is 12/17/2023 Apt was made for the patient 12/07/2023 1:00pm with Dr. Epimenio Foot. Pt tolerated well.
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
## 2023-06-29 NOTE — Progress Notes (Signed)
246.5lbs 6'0

## 2023-07-16 ENCOUNTER — Telehealth: Payer: Self-pay | Admitting: Adult Health

## 2023-07-16 NOTE — Telephone Encounter (Addendum)
From Dr Epimenio Foot:  Please let him know that I am not sure whether or not this would help him but if he feels he can get on and off of it safely he could give 1 a try.  He might want to try 1 out before purchasing to see if he gets benefit    I called the pt and reviewed the response above by Dr Epimenio Foot. The patient verbalized understanding and appreciation. He stated he was worried about his Baclofen pump catheter pulling loose but he said he would only be trying the table at little bit of angle. He is still going to check with his orthopedic doctor due to his history of multiple back surgeries with screws, etc. He also said he is trying to put it off but he may need another back surgery in the future. He appreciated the call back.

## 2023-07-16 NOTE — Telephone Encounter (Signed)
Pt states an inversion table has been suggested to him by his chiropractor, he would like a call to know what Dr Epimenio Foot thinks about this for him

## 2023-07-27 DIAGNOSIS — L820 Inflamed seborrheic keratosis: Secondary | ICD-10-CM | POA: Diagnosis not present

## 2023-07-28 DIAGNOSIS — G4733 Obstructive sleep apnea (adult) (pediatric): Secondary | ICD-10-CM | POA: Diagnosis not present

## 2023-07-28 DIAGNOSIS — I2782 Chronic pulmonary embolism: Secondary | ICD-10-CM | POA: Diagnosis not present

## 2023-07-28 DIAGNOSIS — M4800 Spinal stenosis, site unspecified: Secondary | ICD-10-CM | POA: Diagnosis not present

## 2023-07-28 DIAGNOSIS — R209 Unspecified disturbances of skin sensation: Secondary | ICD-10-CM | POA: Diagnosis not present

## 2023-08-28 DIAGNOSIS — G4733 Obstructive sleep apnea (adult) (pediatric): Secondary | ICD-10-CM | POA: Diagnosis not present

## 2023-08-28 DIAGNOSIS — I2782 Chronic pulmonary embolism: Secondary | ICD-10-CM | POA: Diagnosis not present

## 2023-08-28 DIAGNOSIS — R209 Unspecified disturbances of skin sensation: Secondary | ICD-10-CM | POA: Diagnosis not present

## 2023-08-28 DIAGNOSIS — M4800 Spinal stenosis, site unspecified: Secondary | ICD-10-CM | POA: Diagnosis not present

## 2023-09-11 ENCOUNTER — Other Ambulatory Visit: Payer: Self-pay | Admitting: Neurology

## 2023-09-28 DIAGNOSIS — G4733 Obstructive sleep apnea (adult) (pediatric): Secondary | ICD-10-CM | POA: Diagnosis not present

## 2023-09-28 DIAGNOSIS — M4800 Spinal stenosis, site unspecified: Secondary | ICD-10-CM | POA: Diagnosis not present

## 2023-09-28 DIAGNOSIS — R209 Unspecified disturbances of skin sensation: Secondary | ICD-10-CM | POA: Diagnosis not present

## 2023-09-28 DIAGNOSIS — I2782 Chronic pulmonary embolism: Secondary | ICD-10-CM | POA: Diagnosis not present

## 2023-10-06 DIAGNOSIS — L820 Inflamed seborrheic keratosis: Secondary | ICD-10-CM | POA: Diagnosis not present

## 2023-10-15 DIAGNOSIS — Z125 Encounter for screening for malignant neoplasm of prostate: Secondary | ICD-10-CM | POA: Diagnosis not present

## 2023-10-15 DIAGNOSIS — N529 Male erectile dysfunction, unspecified: Secondary | ICD-10-CM | POA: Diagnosis not present

## 2023-10-15 DIAGNOSIS — I10 Essential (primary) hypertension: Secondary | ICD-10-CM | POA: Diagnosis not present

## 2023-10-15 DIAGNOSIS — Z6833 Body mass index (BMI) 33.0-33.9, adult: Secondary | ICD-10-CM | POA: Diagnosis not present

## 2023-10-15 DIAGNOSIS — K219 Gastro-esophageal reflux disease without esophagitis: Secondary | ICD-10-CM | POA: Diagnosis not present

## 2023-10-15 DIAGNOSIS — M109 Gout, unspecified: Secondary | ICD-10-CM | POA: Diagnosis not present

## 2023-10-15 DIAGNOSIS — G8250 Quadriplegia, unspecified: Secondary | ICD-10-CM | POA: Diagnosis not present

## 2023-10-26 DIAGNOSIS — M4800 Spinal stenosis, site unspecified: Secondary | ICD-10-CM | POA: Diagnosis not present

## 2023-10-26 DIAGNOSIS — G4733 Obstructive sleep apnea (adult) (pediatric): Secondary | ICD-10-CM | POA: Diagnosis not present

## 2023-10-26 DIAGNOSIS — I2782 Chronic pulmonary embolism: Secondary | ICD-10-CM | POA: Diagnosis not present

## 2023-10-26 DIAGNOSIS — R209 Unspecified disturbances of skin sensation: Secondary | ICD-10-CM | POA: Diagnosis not present

## 2023-11-23 ENCOUNTER — Other Ambulatory Visit: Payer: Self-pay | Admitting: *Deleted

## 2023-11-23 NOTE — Telephone Encounter (Addendum)
 Last seen on 06/29/23 per note " Continue ropinirole  for the restless leg syndrome and the lower dose of imipramine (75 mg)   Follow up scheduled on 06/09/24  Rx last filled by PCP too soon to refill    Dispensed Days Supply Quantity Provider Pharmacy  rOPINIRole  (REQUIP ) tablet 10/15/2023 [address], MD [facility] [phone] - [place]

## 2023-11-25 ENCOUNTER — Other Ambulatory Visit: Payer: Self-pay | Admitting: *Deleted

## 2023-11-25 MED ORDER — ROPINIROLE HCL 1 MG PO TABS: ORAL_TABLET | ORAL | 0 refills | Status: DC

## 2023-11-26 DIAGNOSIS — I2782 Chronic pulmonary embolism: Secondary | ICD-10-CM | POA: Diagnosis not present

## 2023-11-26 DIAGNOSIS — R209 Unspecified disturbances of skin sensation: Secondary | ICD-10-CM | POA: Diagnosis not present

## 2023-11-26 DIAGNOSIS — G4733 Obstructive sleep apnea (adult) (pediatric): Secondary | ICD-10-CM | POA: Diagnosis not present

## 2023-11-26 DIAGNOSIS — M4800 Spinal stenosis, site unspecified: Secondary | ICD-10-CM | POA: Diagnosis not present

## 2023-12-07 ENCOUNTER — Ambulatory Visit (INDEPENDENT_AMBULATORY_CARE_PROVIDER_SITE_OTHER): Payer: Medicare PPO | Admitting: Neurology

## 2023-12-07 VITALS — BP 141/89 | HR 77 | Ht 72.0 in | Wt 249.0 lb

## 2023-12-07 DIAGNOSIS — G4733 Obstructive sleep apnea (adult) (pediatric): Secondary | ICD-10-CM | POA: Diagnosis not present

## 2023-12-07 DIAGNOSIS — G2581 Restless legs syndrome: Secondary | ICD-10-CM

## 2023-12-07 DIAGNOSIS — S14105S Unspecified injury at C5 level of cervical spinal cord, sequela: Secondary | ICD-10-CM

## 2023-12-07 DIAGNOSIS — G8250 Quadriplegia, unspecified: Secondary | ICD-10-CM

## 2023-12-07 DIAGNOSIS — Z978 Presence of other specified devices: Secondary | ICD-10-CM | POA: Diagnosis not present

## 2023-12-07 MED ORDER — NORTRIPTYLINE HCL 25 MG PO CAPS: ORAL_CAPSULE | ORAL | 3 refills | Status: DC
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
## 2023-12-07 NOTE — Progress Notes (Signed)
 Rm 2, alone.  Skin prepped with Betadine  and draped in sterile fashion. Center port of implanted pump accessed using Huber needle. Residual fluid measuring 5 ml removed from reservoir.  Medication from [facility] stock instilled into pump. NDC 16109-604-54 Gablofen  40,000 mcg/20 ml. LOT: [id number] and EXP 07/2025. Estimated replacement date: 03/2025.  Pump reprogrammed to reflect volume of 20ml.  New alarm date is 05/26/24 Apt was made for the patient 05/16/24 1:00pm with Dr. Godwin Lat. Pt tolerated well.

## 2023-12-26 DIAGNOSIS — I2782 Chronic pulmonary embolism: Secondary | ICD-10-CM | POA: Diagnosis not present

## 2023-12-26 DIAGNOSIS — M4800 Spinal stenosis, site unspecified: Secondary | ICD-10-CM | POA: Diagnosis not present

## 2023-12-26 DIAGNOSIS — R209 Unspecified disturbances of skin sensation: Secondary | ICD-10-CM | POA: Diagnosis not present

## 2023-12-26 DIAGNOSIS — G4733 Obstructive sleep apnea (adult) (pediatric): Secondary | ICD-10-CM | POA: Diagnosis not present

## 2024-01-11 ENCOUNTER — Telehealth: Payer: Self-pay | Admitting: Adult Health

## 2024-01-11 ENCOUNTER — Other Ambulatory Visit: Payer: Self-pay | Admitting: Neurology

## 2024-01-11 MED ORDER — OXYCODONE HCL 5 MG PO TABS: ORAL_TABLET | ORAL | 0 refills | Status: DC

## 2024-01-11 NOTE — Telephone Encounter (Signed)
 Spoke with patient. He states he weaned off the Nortriptyline  (75 mg then 50 mg then 25 mg then off). He has been off for about a week and did not have any issues weaning. Patient said although he's always in pain, it is not really the pain that bothers him as it is the restless legs right now. He thought Dr Godwin Lat had mentioned another medication to treat RLS that would be ok to take alongside the Ropinirole . Patient said he has tried oxycodone  before when he's had back surgeries so if he does need a narcotic he would want a low dose.

## 2024-01-11 NOTE — Telephone Encounter (Signed)
 This is the message from last appt with Dr Godwin Lat: "Continue ropinirole  for the restless leg syndrome.  We discussed cutting the nortriptyline  down to 75 mg and, if he does not feel dysesthesias worsen we can reduce this even further.  If this does not help restless legs, consider adding a hydrocodone at bedtime."

## 2024-01-11 NOTE — Telephone Encounter (Signed)
 Pt states the rOPINIRole  (REQUIP ) 1 MG tablet isn't working by itself, pt would like a call from RN to discuss the other medication Dr Godwin Lat said he could take with the rOPINIRole  (REQUIP ) 1 MG tablet

## 2024-01-12 NOTE — Telephone Encounter (Signed)
 Sater, Sherida Dimmer, MD  You16 hours ago (4:31 PM)    I sent in a prescription for low-dose oxycodone  to take at bedtime

## 2024-01-12 NOTE — Telephone Encounter (Signed)
 I called the patient and LVM (ok per DPR) advising him that Dr Godwin Lat sent in a low dose Oxycodone  5 mg at bedtime to take. I informed pt this can cause some sedation, constipation, please call us  for any questions or concerns. I advised him not to take this medication with any alcohol or other sedating medications, unsure if/when he takes diazepam  but warned against taking together due to risk for respiratory depression. For any questions please consult with us  and pharmacy. Left office number for call back.

## 2024-01-15 ENCOUNTER — Telehealth: Payer: Self-pay

## 2024-01-15 ENCOUNTER — Other Ambulatory Visit (HOSPITAL_COMMUNITY): Payer: Self-pay

## 2024-01-15 NOTE — Telephone Encounter (Signed)
 Pharmacy Patient Advocate Encounter   Received notification from Fax that prior authorization for oxyCODONE  HCl 5MG  tablets is required/requested.   Insurance verification completed.   The patient is insured through [place] .   Per test claim: PA required; PA submitted to above mentioned insurance via CoverMyMeds Key/confirmation #/EOC [id number] Status is pending

## 2024-01-18 ENCOUNTER — Other Ambulatory Visit (HOSPITAL_COMMUNITY): Payer: Self-pay

## 2024-01-18 NOTE — Telephone Encounter (Signed)
 Pharmacy Patient Advocate Encounter  Received notification from [employer] that Prior Authorization for oxyCODONE  HCl 5MG  tablets has been APPROVED from 01/18/2024 to 08/03/2024. Ran test claim, Copay is $3.30. This test claim was processed through [facility]- copay amounts may vary at other pharmacies due to pharmacy/plan contracts, or as the patient moves through the different stages of their insurance plan.   PA #/Case ID/Reference #: PA Case ID #: [id number]

## 2024-01-26 DIAGNOSIS — R209 Unspecified disturbances of skin sensation: Secondary | ICD-10-CM | POA: Diagnosis not present

## 2024-01-26 DIAGNOSIS — M4800 Spinal stenosis, site unspecified: Secondary | ICD-10-CM | POA: Diagnosis not present

## 2024-01-26 DIAGNOSIS — G4733 Obstructive sleep apnea (adult) (pediatric): Secondary | ICD-10-CM | POA: Diagnosis not present

## 2024-01-26 DIAGNOSIS — I2782 Chronic pulmonary embolism: Secondary | ICD-10-CM | POA: Diagnosis not present

## 2024-01-29 DIAGNOSIS — D225 Melanocytic nevi of trunk: Secondary | ICD-10-CM | POA: Diagnosis not present

## 2024-01-29 DIAGNOSIS — L820 Inflamed seborrheic keratosis: Secondary | ICD-10-CM | POA: Diagnosis not present

## 2024-02-08 ENCOUNTER — Other Ambulatory Visit: Payer: Self-pay | Admitting: Neurology

## 2024-02-08 NOTE — Telephone Encounter (Signed)
 Last office visit was on 06/30/23 Follow up office visit on 06/29/24

## 2024-02-25 DIAGNOSIS — M4800 Spinal stenosis, site unspecified: Secondary | ICD-10-CM | POA: Diagnosis not present

## 2024-02-25 DIAGNOSIS — I2782 Chronic pulmonary embolism: Secondary | ICD-10-CM | POA: Diagnosis not present

## 2024-02-25 DIAGNOSIS — R209 Unspecified disturbances of skin sensation: Secondary | ICD-10-CM | POA: Diagnosis not present

## 2024-02-25 DIAGNOSIS — G4733 Obstructive sleep apnea (adult) (pediatric): Secondary | ICD-10-CM | POA: Diagnosis not present

## 2024-03-27 DIAGNOSIS — I2782 Chronic pulmonary embolism: Secondary | ICD-10-CM | POA: Diagnosis not present

## 2024-03-27 DIAGNOSIS — M4800 Spinal stenosis, site unspecified: Secondary | ICD-10-CM | POA: Diagnosis not present

## 2024-03-27 DIAGNOSIS — R209 Unspecified disturbances of skin sensation: Secondary | ICD-10-CM | POA: Diagnosis not present

## 2024-03-27 DIAGNOSIS — G4733 Obstructive sleep apnea (adult) (pediatric): Secondary | ICD-10-CM | POA: Diagnosis not present

## 2024-04-11 DIAGNOSIS — D485 Neoplasm of uncertain behavior of skin: Secondary | ICD-10-CM | POA: Diagnosis not present

## 2024-04-11 DIAGNOSIS — L820 Inflamed seborrheic keratosis: Secondary | ICD-10-CM | POA: Diagnosis not present

## 2024-04-27 DIAGNOSIS — M4800 Spinal stenosis, site unspecified: Secondary | ICD-10-CM | POA: Diagnosis not present

## 2024-04-27 DIAGNOSIS — I2782 Chronic pulmonary embolism: Secondary | ICD-10-CM | POA: Diagnosis not present

## 2024-04-27 DIAGNOSIS — R209 Unspecified disturbances of skin sensation: Secondary | ICD-10-CM | POA: Diagnosis not present

## 2024-04-27 DIAGNOSIS — G4733 Obstructive sleep apnea (adult) (pediatric): Secondary | ICD-10-CM | POA: Diagnosis not present

## 2024-05-11 ENCOUNTER — Telehealth: Payer: Self-pay | Admitting: *Deleted

## 2024-05-11 NOTE — Telephone Encounter (Signed)
 Called pt. R/s pump refill from 05/16/24 to 05/19/24 at 10am with Dr. Vear.

## 2024-05-16 ENCOUNTER — Encounter: Admitting: Neurology

## 2024-05-19 ENCOUNTER — Ambulatory Visit: Admitting: Neurology

## 2024-05-19 ENCOUNTER — Encounter: Payer: Self-pay | Admitting: Neurology

## 2024-05-19 ENCOUNTER — Telehealth: Payer: Self-pay | Admitting: Neurology

## 2024-05-19 VITALS — BP 124/84 | HR 45

## 2024-05-19 DIAGNOSIS — G800 Spastic quadriplegic cerebral palsy: Secondary | ICD-10-CM

## 2024-05-19 DIAGNOSIS — Z978 Presence of other specified devices: Secondary | ICD-10-CM | POA: Diagnosis not present

## 2024-05-19 DIAGNOSIS — G2581 Restless legs syndrome: Secondary | ICD-10-CM

## 2024-05-19 DIAGNOSIS — S14105S Unspecified injury at C5 level of cervical spinal cord, sequela: Secondary | ICD-10-CM

## 2024-05-19 MED ORDER — BACLOFEN 40000 MCG/20ML IT SOLN: 40000.0000 ug | Freq: Once | INTRATHECAL | Status: AC
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
## 2024-05-19 NOTE — Progress Notes (Signed)
 RM 1, alone. Skin prepped with alcohol and draped in sterile fashion. Center port of implanted pump accessed using Huber needle. Residual fluid measuring 5 ml removed from reservoir.  Medication from [facility] stock instilled into pump. NDC 33205-842-98 Gablofen  40,000 mcg/20 ml. LOT: [id number] and EXP: 11/2025. Estimated replacement date: 03/2025.  Pump reprogrammed to reflect volume of 20ml.  New alarm date is 11/06/24. Apt was made for 10/24/24 at 1:30pm with Dr. Vear. Pt tolerated well.

## 2024-05-19 NOTE — Telephone Encounter (Signed)
 Referral to Neurosurgery faxed to [facility]  [facility] Phone:[phone] [phone]

## 2024-05-26 NOTE — Telephone Encounter (Signed)
 Regina Referral Coordinator   from [facility] called to informed that  [facility] will not be able to see Pts  with Baclofen  Pump  due to  not having a Provider there to assist Pts anymore .  Angeline did recommend that before Dr. Emerick left he was recommending pt to Dr. Megan Lovorn or to call  [employer] to see which MD are in network with Pt

## 2024-05-26 NOTE — Telephone Encounter (Signed)
 Referral to Neurosurgery faxed to [facility]   [facility]  Phone:[phone] Fax:[phone]

## 2024-05-27 DIAGNOSIS — I2782 Chronic pulmonary embolism: Secondary | ICD-10-CM | POA: Diagnosis not present

## 2024-05-27 DIAGNOSIS — G4733 Obstructive sleep apnea (adult) (pediatric): Secondary | ICD-10-CM | POA: Diagnosis not present

## 2024-05-27 DIAGNOSIS — M4800 Spinal stenosis, site unspecified: Secondary | ICD-10-CM | POA: Diagnosis not present

## 2024-05-27 DIAGNOSIS — R209 Unspecified disturbances of skin sensation: Secondary | ICD-10-CM | POA: Diagnosis not present

## 2024-06-06 ENCOUNTER — Telehealth: Payer: Self-pay | Admitting: Neurology

## 2024-06-06 NOTE — Telephone Encounter (Signed)
 Patient said can Dr. Vear fill out handicap placard form. Forgot to bring second form with me to appointment. Have an appointment with with [facility]  NP on 06/09/24. Want to know if form can be filled out while I'm at the appointment. Would like a call back to let me know.

## 2024-06-08 DIAGNOSIS — I10 Essential (primary) hypertension: Secondary | ICD-10-CM | POA: Diagnosis not present

## 2024-06-08 DIAGNOSIS — Z1339 Encounter for screening examination for other mental health and behavioral disorders: Secondary | ICD-10-CM | POA: Diagnosis not present

## 2024-06-08 DIAGNOSIS — M109 Gout, unspecified: Secondary | ICD-10-CM | POA: Diagnosis not present

## 2024-06-08 DIAGNOSIS — G8250 Quadriplegia, unspecified: Secondary | ICD-10-CM | POA: Diagnosis not present

## 2024-06-08 DIAGNOSIS — Z0000 Encounter for general adult medical examination without abnormal findings: Secondary | ICD-10-CM | POA: Diagnosis not present

## 2024-06-08 DIAGNOSIS — Z6833 Body mass index (BMI) 33.0-33.9, adult: Secondary | ICD-10-CM | POA: Diagnosis not present

## 2024-06-08 DIAGNOSIS — G2581 Restless legs syndrome: Secondary | ICD-10-CM | POA: Diagnosis not present

## 2024-06-08 DIAGNOSIS — Z1331 Encounter for screening for depression: Secondary | ICD-10-CM | POA: Diagnosis not present

## 2024-06-09 ENCOUNTER — Encounter: Payer: Self-pay | Admitting: Adult Health

## 2024-06-09 ENCOUNTER — Ambulatory Visit: Payer: Medicare PPO | Admitting: Adult Health

## 2024-06-09 VITALS — BP 132/79 | HR 70 | Ht 72.0 in | Wt 242.0 lb

## 2024-06-09 DIAGNOSIS — G4733 Obstructive sleep apnea (adult) (pediatric): Secondary | ICD-10-CM | POA: Diagnosis not present

## 2024-06-09 NOTE — Patient Instructions (Signed)
 Continue using CPAP nightly and greater than 4 hours each night If your symptoms worsen or you develop new symptoms please let us  know.
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
## 2024-06-13 NOTE — Progress Notes (Signed)
 Community message has been sent to [employer] for supplies on 06/13/24. DD

## 2024-06-27 DIAGNOSIS — G4733 Obstructive sleep apnea (adult) (pediatric): Secondary | ICD-10-CM | POA: Diagnosis not present

## 2024-06-27 DIAGNOSIS — I2782 Chronic pulmonary embolism: Secondary | ICD-10-CM | POA: Diagnosis not present

## 2024-06-27 DIAGNOSIS — M4800 Spinal stenosis, site unspecified: Secondary | ICD-10-CM | POA: Diagnosis not present

## 2024-06-27 DIAGNOSIS — R209 Unspecified disturbances of skin sensation: Secondary | ICD-10-CM | POA: Diagnosis not present

## 2024-08-11 ENCOUNTER — Telehealth: Payer: Self-pay | Admitting: Neurology

## 2024-08-11 NOTE — Telephone Encounter (Signed)
 LVM informing pt his 3/23 appt needs rescheduled

## 2024-08-23 NOTE — Telephone Encounter (Signed)
 Rescheduled appointment on 11/02/24 at 10:30 am

## 2024-10-24 ENCOUNTER — Encounter: Admitting: Neurology

## 2024-11-02 ENCOUNTER — Encounter: Admitting: Neurology

## 2025-06-13 ENCOUNTER — Ambulatory Visit: Admitting: Adult Health
